# Patient Record
Sex: Female | Born: 1990
Health system: Southern US, Community
[De-identification: ages and names within clinical notes are randomized; demographics above are authoritative.]

## PROBLEM LIST (undated history)

## (undated) DIAGNOSIS — R0600 Dyspnea, unspecified: Secondary | ICD-10-CM

## (undated) DIAGNOSIS — K219 Gastro-esophageal reflux disease without esophagitis: Secondary | ICD-10-CM

## (undated) DIAGNOSIS — E282 Polycystic ovarian syndrome: Secondary | ICD-10-CM

## (undated) DIAGNOSIS — Z87442 Personal history of urinary calculi: Secondary | ICD-10-CM

## (undated) DIAGNOSIS — I10 Essential (primary) hypertension: Secondary | ICD-10-CM

## (undated) DIAGNOSIS — E669 Obesity, unspecified: Secondary | ICD-10-CM

## (undated) DIAGNOSIS — F419 Anxiety disorder, unspecified: Secondary | ICD-10-CM

## (undated) DIAGNOSIS — D649 Anemia, unspecified: Secondary | ICD-10-CM

## (undated) DIAGNOSIS — Z91018 Allergy to other foods: Secondary | ICD-10-CM

## (undated) DIAGNOSIS — L409 Psoriasis, unspecified: Secondary | ICD-10-CM

## (undated) DIAGNOSIS — L732 Hidradenitis suppurativa: Secondary | ICD-10-CM

## (undated) HISTORY — PX: OTHER SURGICAL HISTORY: SHX169

## (undated) HISTORY — PX: TONSILLECTOMY: SUR1361

## (undated) HISTORY — DX: Gastro-esophageal reflux disease without esophagitis: K21.9

## (undated) HISTORY — PX: WISDOM TOOTH EXTRACTION: SHX21

## (undated) HISTORY — PX: TYMPANOSTOMY TUBE PLACEMENT: SHX32

## (undated) HISTORY — PX: ANKLE RECONSTRUCTION: SHX1151

---
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
## 2013-11-15 ENCOUNTER — Other Ambulatory Visit: Payer: Self-pay | Admitting: Family Medicine

## 2013-11-15 DIAGNOSIS — Z20828 Contact with and (suspected) exposure to other viral communicable diseases: Secondary | ICD-10-CM

## 2013-11-15 MED ORDER — OSELTAMIVIR PHOSPHATE 75 MG PO CAPS: 75.0000 mg | ORAL_CAPSULE | Freq: Two times a day (BID) | ORAL | Status: AC

## 2015-09-26 LAB — CBC AND DIFFERENTIAL
HCT: 44 % (ref 36–46)
Hemoglobin: 14.5 g/dL (ref 12.0–16.0)
Neutrophils Absolute: 6 /uL
Platelets: 327 10*3/uL (ref 150–399)
WBC: 8.4 10^3/mL

## 2015-09-26 LAB — LIPID PANEL
Cholesterol: 148 mg/dL (ref 0–200)
HDL: 40 mg/dL (ref 35–70)
LDL Cholesterol: 92 mg/dL
LDl/HDL Ratio: 3.7
Triglycerides: 79 mg/dL (ref 40–160)

## 2015-09-26 LAB — HEPATIC FUNCTION PANEL
ALT: 25 U/L (ref 3–30)
AST: 20 U/L (ref 2–40)
Alkaline Phosphatase: 59 U/L (ref 25–125)
Bilirubin, Total: 0.5 mg/dL

## 2015-09-26 LAB — BASIC METABOLIC PANEL
BUN: 11 mg/dL (ref 4–21)
Creatinine: 0.6 mg/dL (ref 0.5–1.1)
Glucose: 87 mg/dL
Potassium: 3.7 mmol/L (ref 3.4–5.3)
Sodium: 138 mmol/L (ref 137–147)

## 2015-09-26 LAB — HEMOGLOBIN A1C: Hemoglobin A1C: 5.3

## 2015-09-26 LAB — VITAMIN D 25 HYDROXY (VIT D DEFICIENCY, FRACTURES): Vit D, 25-Hydroxy: 25.4

## 2015-09-26 LAB — TSH: TSH: 1.09 u[IU]/mL (ref 0.41–5.90)

## 2015-12-27 ENCOUNTER — Telehealth: Payer: 59 | Admitting: Nurse Practitioner

## 2015-12-27 DIAGNOSIS — J0100 Acute maxillary sinusitis, unspecified: Secondary | ICD-10-CM | POA: Diagnosis not present

## 2015-12-27 MED ORDER — AMOXICILLIN-POT CLAVULANATE 875-125 MG PO TABS: 1.0000 | ORAL_TABLET | Freq: Two times a day (BID) | ORAL | Status: DC

## 2015-12-27 NOTE — Progress Notes (Signed)

## 2016-01-04 ENCOUNTER — Telehealth: Payer: Self-pay | Admitting: *Deleted

## 2016-01-04 NOTE — Telephone Encounter (Signed)
PLEASE CALL IN TRIANEX FOR PTS ECZEMA TO [facility] IN [place].

## 2016-01-08 ENCOUNTER — Other Ambulatory Visit: Payer: Self-pay

## 2016-01-08 MED ORDER — TRIAMCINOLONE ACETONIDE 0.05 % EX OINT: TOPICAL_OINTMENT | CUTANEOUS | Status: DC

## 2016-01-08 NOTE — Telephone Encounter (Signed)
Trianex 430g jar Rx sent to [facility]. Patient informed.

## 2016-01-08 NOTE — Telephone Encounter (Signed)
Please complete.

## 2016-04-03 DIAGNOSIS — R87615 Unsatisfactory cytologic smear of cervix: Secondary | ICD-10-CM | POA: Diagnosis not present

## 2016-04-03 LAB — HM PAP SMEAR: HM Pap smear: NORMAL

## 2016-05-09 ENCOUNTER — Telehealth: Payer: 59 | Admitting: Physician Assistant

## 2016-05-09 DIAGNOSIS — R0789 Other chest pain: Secondary | ICD-10-CM

## 2016-05-09 DIAGNOSIS — J069 Acute upper respiratory infection, unspecified: Secondary | ICD-10-CM

## 2016-05-09 NOTE — Progress Notes (Signed)
Based on what you shared with me it looks like you have a serious condition that should be evaluated in a face to face office visit. Your overall symptoms seem consistent with a viral sinusitis, but the mention of chest pressure and difficulty breathing is concerning. As such, I recommend you see a provider -- either your primary care provider or at an urgent care -- within 24 hours for a good lung examination to make sure there is no concern for a walking pneumonia or reactive airway disease. Stay well hydrated and use some plain Mucinex to thin out congestion. Saline nasal spray to help flush out nasal passages and cut down on post-nasal drip. Again please be seen within 24 hours for a good exam so we can get you feeling better.  If you are having a true medical emergency please call 911.  If you need an urgent face to face visit, [place] has four urgent care centers for your convenience.  If you need care fast and have a high deductible or no insurance consider:   [email or web address]  [phone]  [address], [place], [place] [address] 8 am to 8 pm Monday-Friday 10 am to 4 pm Saturday-Sunday   The following sites will take your  insurance:    . [facility] [place] a Provider at this Location  [address] [place], [place] [address] . 8 am to 8 pm Monday-Friday . 9 am to 7 pm Saturday-Sunday   . [facility] [place] a Provider at this Location  [address] [place], [place], [place] [address] . 8 am to 8 pm Monday-Friday . 9 am to 6 pm Saturday . 11 am to 6 pm Sunday   . [facility] [place] Get Driving Directions  [id number] [place], [place] [address] . 8 am to 8 pm Monday-Friday . 9 am to 4 pm Saturday-Sunday   . [facility] (a walk in  primary care provider)  [place] a Provider at this Location  [place], [place] [address] . 8 am to 8:30 pm Monday-Thursday . 8 am to 6 pm Friday . 8 am to 4 pm Saturday-Sunday   Your e-visit answers were reviewed by a board certified advanced clinical practitioner to complete your personal care plan.  Thank you for using e-Visits.

## 2016-05-10 ENCOUNTER — Telehealth: Payer: Self-pay | Admitting: *Deleted

## 2016-05-10 MED ORDER — OSELTAMIVIR PHOSPHATE 75 MG PO CAPS: 75.0000 mg | ORAL_CAPSULE | Freq: Two times a day (BID) | ORAL | Status: DC

## 2016-05-10 NOTE — Telephone Encounter (Signed)
Please inform Brighten that this actually could be influenza and we will treat her for that condition by using Tamiflu 75 mg twice a day. She can also use prednisone 10 mg once a day for the next 5 days in addition to all her other medications including ibuprofen and Mucinex and antihistamine as needed. If for some reason she gets worse she will need to contact us for further evaluation and treatment

## 2016-05-10 NOTE — Telephone Encounter (Signed)
PATIENT ADVISED OF INSTRUCTIONS, RX SENT

## 2016-05-10 NOTE — Telephone Encounter (Signed)
C/o chest congestion got worse since yesterday cough is worse headache, chest gets tight at times. Delsym is giving her an upset stomach. Can we prescribe something?

## 2016-05-15 DIAGNOSIS — H5213 Myopia, bilateral: Secondary | ICD-10-CM | POA: Diagnosis not present

## 2016-05-15 DIAGNOSIS — H52222 Regular astigmatism, left eye: Secondary | ICD-10-CM | POA: Diagnosis not present

## 2016-05-24 ENCOUNTER — Telehealth: Payer: 59 | Admitting: Family

## 2016-05-24 DIAGNOSIS — J329 Chronic sinusitis, unspecified: Secondary | ICD-10-CM

## 2016-05-24 DIAGNOSIS — B9689 Other specified bacterial agents as the cause of diseases classified elsewhere: Secondary | ICD-10-CM

## 2016-05-24 DIAGNOSIS — A499 Bacterial infection, unspecified: Secondary | ICD-10-CM

## 2016-05-24 MED ORDER — AMOXICILLIN-POT CLAVULANATE 875-125 MG PO TABS: 1.0000 | ORAL_TABLET | Freq: Two times a day (BID) | ORAL | Status: DC

## 2016-05-24 NOTE — Progress Notes (Signed)

## 2016-11-02 ENCOUNTER — Telehealth: Payer: 59 | Admitting: Family

## 2016-11-02 DIAGNOSIS — B9689 Other specified bacterial agents as the cause of diseases classified elsewhere: Secondary | ICD-10-CM | POA: Diagnosis not present

## 2016-11-02 DIAGNOSIS — J329 Chronic sinusitis, unspecified: Secondary | ICD-10-CM | POA: Diagnosis not present

## 2016-11-02 MED ORDER — AMOXICILLIN-POT CLAVULANATE 875-125 MG PO TABS: 1.0000 | ORAL_TABLET | Freq: Two times a day (BID) | ORAL | 0 refills | Status: AC

## 2016-11-02 NOTE — Progress Notes (Signed)

## 2016-12-15 ENCOUNTER — Emergency Department (HOSPITAL_BASED_OUTPATIENT_CLINIC_OR_DEPARTMENT_OTHER): Payer: 59

## 2016-12-15 ENCOUNTER — Emergency Department (HOSPITAL_BASED_OUTPATIENT_CLINIC_OR_DEPARTMENT_OTHER)
Admission: EM | Admit: 2016-12-15 | Discharge: 2016-12-15 | Disposition: A | Discharge status: Home / Self Care | Payer: 59 | Attending: Emergency Medicine | Admitting: Emergency Medicine

## 2016-12-15 ENCOUNTER — Encounter (HOSPITAL_BASED_OUTPATIENT_CLINIC_OR_DEPARTMENT_OTHER): Payer: Self-pay | Admitting: Emergency Medicine

## 2016-12-15 DIAGNOSIS — Y9222 Religious institution as the place of occurrence of the external cause: Secondary | ICD-10-CM | POA: Insufficient documentation

## 2016-12-15 DIAGNOSIS — S99921A Unspecified injury of right foot, initial encounter: Secondary | ICD-10-CM | POA: Diagnosis not present

## 2016-12-15 DIAGNOSIS — Y9389 Activity, other specified: Secondary | ICD-10-CM | POA: Insufficient documentation

## 2016-12-15 DIAGNOSIS — M79604 Pain in right leg: Secondary | ICD-10-CM | POA: Insufficient documentation

## 2016-12-15 DIAGNOSIS — M7989 Other specified soft tissue disorders: Secondary | ICD-10-CM | POA: Diagnosis not present

## 2016-12-15 DIAGNOSIS — Z791 Long term (current) use of non-steroidal anti-inflammatories (NSAID): Secondary | ICD-10-CM | POA: Diagnosis not present

## 2016-12-15 DIAGNOSIS — Y999 Unspecified external cause status: Secondary | ICD-10-CM | POA: Diagnosis not present

## 2016-12-15 DIAGNOSIS — Z79899 Other long term (current) drug therapy: Secondary | ICD-10-CM | POA: Insufficient documentation

## 2016-12-15 DIAGNOSIS — I10 Essential (primary) hypertension: Secondary | ICD-10-CM | POA: Insufficient documentation

## 2016-12-15 DIAGNOSIS — W1839XA Other fall on same level, initial encounter: Secondary | ICD-10-CM | POA: Insufficient documentation

## 2016-12-15 DIAGNOSIS — S8991XA Unspecified injury of right lower leg, initial encounter: Secondary | ICD-10-CM | POA: Diagnosis present

## 2016-12-15 DIAGNOSIS — M79671 Pain in right foot: Secondary | ICD-10-CM | POA: Diagnosis not present

## 2016-12-15 DIAGNOSIS — W19XXXA Unspecified fall, initial encounter: Secondary | ICD-10-CM

## 2016-12-15 DIAGNOSIS — M25561 Pain in right knee: Secondary | ICD-10-CM | POA: Diagnosis not present

## 2016-12-15 HISTORY — DX: Polycystic ovarian syndrome: E28.2

## 2016-12-15 HISTORY — DX: Obesity, unspecified: E66.9

## 2016-12-15 HISTORY — DX: Essential (primary) hypertension: I10

## 2016-12-15 NOTE — ED Notes (Signed)
Patient transported to X-ray 

## 2016-12-15 NOTE — ED Notes (Signed)
Pt states she fell [place] the steps at church. C/O pain and swelling to RLE. Ice being applied. Also c/o pain to right wrist and 4th and 5th toes of right foot. Abrasions noted.

## 2016-12-15 NOTE — ED Provider Notes (Signed)
[facility] Provider Note   CSN: [id number] Arrival date & time: 12/15/16  1324  By signing my name below, I, [name] Fuentes, attest that this documentation has been prepared under the direction and in the presence of non-physician practitioner, Melina Schools, PA-C. Electronically Signed: Jeanell Fuentes, Scribe. 12/15/2016. 4:06 PM.  History   Chief Complaint Chief Complaint  Patient presents with  . Fall   The history is provided by the patient. No language interpreter was used.   HPI Comments: [name] Fuentes is a 26 y.o. female who presents to the Emergency Department complaining of a fall that occurred about 6 hours ago. She states she fell going up stairs at church and landed on her her LLE and right wrist. She denies any LOC or head injury. She reports constant moderate right shin pain that is exacerbated by movement. She had associated swelling and redness that was relieved by ice. She further reports mild wounds to LLE.Tetanus is UTD. She denies any gait problem or other complaints.     PCP: Ival Bible, MD  Past Medical History:  Diagnosis Date  . Hypertension   . Obese   . PCOS (polycystic ovarian syndrome)     There are no active problems to display for this patient.   Past Surgical History:  Procedure Laterality Date  . ANKLE RECONSTRUCTION     L ankle  . Tempano Plasty    . TONSILLECTOMY      OB History    No data available       Home Medications    Prior to Admission medications   Medication Sig Start Date End Date Taking? Authorizing Provider  ranitidine (ZANTAC) 150 MG tablet Take 150 mg by mouth 2 (two) times daily.   Yes Historical Provider, MD  amoxicillin-clavulanate (AUGMENTIN) 875-125 MG tablet Take 1 tablet by mouth 2 (two) times daily. 05/24/16   Kennyth Arnold, FNP  ibuprofen (ADVIL,MOTRIN) 200 MG tablet Take 400 mg by mouth every 6 (six) hours as needed. For pain     Historical Provider, MD  oseltamivir (TAMIFLU) 75 MG  capsule Take 1 capsule (75 mg total) by mouth 2 (two) times daily. 05/10/16   Jiles Prows, MD  TRIAMCINOLONE ACETONIDE, TOP, (TRIANEX) 0.05 % OINT Apply to affected areas once daily as directed. 01/08/16   Jiles Prows, MD    Family History No family history on file.  Social History Social History  Substance Use Topics  . Smoking status: Never Smoker  . Smokeless tobacco: Never Used  . Alcohol use No     Allergies   Patient has no known allergies.   Review of Systems Review of Systems  Musculoskeletal: Positive for arthralgias, joint swelling and myalgias (Left shin). Negative for gait problem.  Skin: Positive for color change and wound (LLE, mild).  Neurological: Negative for syncope.  All other systems reviewed and are negative.    Physical Exam Updated Vital Signs BP 163/78 (BP Location: Right Arm)   Pulse 94   Temp 98.1 F (36.7 C) (Oral)   Resp 18   Ht 5\' 2"  (1.575 m)   Wt (!) 330 lb (149.7 kg)   SpO2 100%   BMI 60.36 kg/m   Physical Exam  Constitutional: She appears well-developed and well-nourished. No distress.  HENT:  Head: Normocephalic.  Eyes: Conjunctivae are normal.  Neck: Neck supple.  Cardiovascular: Normal rate and regular rhythm.   Pulmonary/Chest: Effort normal.  Abdominal: Soft.  Musculoskeletal: Normal range of motion.  Significant edema over the right anterior shin. Superficial abrasions noted. Bleeding is controlled. Able to ambulate with normal gait. DP pulses are +2 bilaterally. Cap refill is normal. Sensation intact. Full ROM of the right knee and right ankle without pain. No deformities or crepitus noted. Abrasions to the top of the right foot. Bleed is controlled.   Neurological: She is alert.  Skin: Skin is warm and dry.  Psychiatric: She has a normal mood and affect.  Nursing note and vitals reviewed.    ED Treatments / Results  DIAGNOSTIC STUDIES: Oxygen Saturation is 100% on RA, normal by my interpretation.    COORDINATION  OF CARE: 4:10 PM- Pt advised of plan for treatment and pt agrees.  Labs (all labs ordered are listed, but only abnormal results are displayed) Labs Reviewed - No data to display  EKG  EKG Interpretation None       Radiology Dg Tibia/fibula Right  Result Date: 12/15/2016 CLINICAL DATA:  [place].  Pain. EXAM: RIGHT TIBIA AND FIBULA - 2 VIEW COMPARISON:  None. FINDINGS: There is no evidence of fracture or other focal bone lesions. Marked anterior soft tissue swelling. Increased body habitus. IMPRESSION: Marked anterior soft tissue swelling.  No fracture is seen. Electronically Signed   By: Staci Righter M.D.   On: 12/15/2016 14:09   Dg Knee Complete 4 Views Right  Result Date: 12/15/2016 CLINICAL DATA:  [place] going up stairs at church today. Proximal tib-fib pain. EXAM: RIGHT KNEE - COMPLETE 4+ VIEW COMPARISON:  None. FINDINGS: No evidence of fracture, dislocation, or joint effusion. No evidence of arthropathy or other focal bone abnormality. Soft tissues are unremarkable except for possible anterior soft tissue swelling in the proximal lower leg. IMPRESSION: Negative. Electronically Signed   By: Nelson Chimes M.D.   On: 12/15/2016 16:47   Dg Foot Complete Right  Result Date: 12/15/2016 CLINICAL DATA:  [place] today going up stairs at church. Metatarsal pain. EXAM: RIGHT FOOT COMPLETE - 3+ VIEW COMPARISON:  None. FINDINGS: There is no evidence of fracture or dislocation. There is no evidence of arthropathy or other focal bone abnormality. Soft tissues are unremarkable. IMPRESSION: Negative. Electronically Signed   By: Nelson Chimes M.D.   On: 12/15/2016 16:47    Procedures Procedures (including critical care time)  Medications Ordered in ED Medications - No data to display   Initial Impression / Assessment and Plan / ED Course  I have reviewed the triage vital signs and the nursing notes.  Pertinent labs & imaging results that were available during my care of the patient were reviewed by me  and considered in my medical decision making (see chart for details).     Patient X-Ray negative for obvious fracture or dislocation. Pain managed in ED. Neurovascularly intact. Pt advised to follow up with orthopedics if symptoms persist for possibility of missed fracture diagnosis. Patient given brace while in ED, conservative therapy recommended and discussed. Patient will be dc home & is agreeable with above plan.   Final Clinical Impressions(s) / ED Diagnoses   Final diagnoses:  Fall, initial encounter  Right leg pain    New Prescriptions Discharge Medication List as of 12/15/2016  4:57 PM     I personally performed the services described in this documentation, which was scribed in my presence. The recorded information has been reviewed and is accurate.     Doristine Devoid, PA-C 12/16/16 [place], MD 12/16/16 [phone]

## 2016-12-15 NOTE — Discharge Instructions (Signed)
All of your x-rays show no signs of fracture. This is likely a sprain. Please rest, ice, elevate your right leg. Use crutches at home as needed. Weight bearing as tolerated. Motrin Tylenol for pain and swelling. Follow-up with her primary care doctor if her symptoms do not improve in the next 3-4 days for repeat x-rays for possibility of missed fracture.

## 2016-12-15 NOTE — ED Triage Notes (Signed)
States fell at church due to shoe getting caught on step. Pain to RLL, abrasion and swelling noted to shin area. No LOC

## 2016-12-15 NOTE — ED Notes (Signed)
Pt and family given d/c instructions as per chart. Verbalizes understanding. No questions. 

## 2017-03-12 ENCOUNTER — Encounter: Payer: Self-pay | Admitting: Physician Assistant

## 2017-03-12 ENCOUNTER — Ambulatory Visit (INDEPENDENT_AMBULATORY_CARE_PROVIDER_SITE_OTHER): Payer: 59 | Admitting: Physician Assistant

## 2017-03-12 VITALS — BP 133/89 | HR 79 | Ht 62.0 in | Wt 340.0 lb

## 2017-03-12 DIAGNOSIS — I10 Essential (primary) hypertension: Secondary | ICD-10-CM | POA: Diagnosis not present

## 2017-03-12 DIAGNOSIS — E282 Polycystic ovarian syndrome: Secondary | ICD-10-CM

## 2017-03-12 DIAGNOSIS — L409 Psoriasis, unspecified: Secondary | ICD-10-CM | POA: Diagnosis not present

## 2017-03-12 DIAGNOSIS — K219 Gastro-esophageal reflux disease without esophagitis: Secondary | ICD-10-CM | POA: Diagnosis not present

## 2017-03-12 DIAGNOSIS — Z23 Encounter for immunization: Secondary | ICD-10-CM

## 2017-03-12 DIAGNOSIS — R030 Elevated blood-pressure reading, without diagnosis of hypertension: Secondary | ICD-10-CM | POA: Diagnosis not present

## 2017-03-12 MED ORDER — METFORMIN HCL ER 500 MG PO TB24: 500.0000 mg | ORAL_TABLET | Freq: Every day | ORAL | 11 refills | Status: DC

## 2017-03-12 MED ORDER — CLOBETASOL PROPIONATE 0.05 % EX CREA: 1.0000 "application " | TOPICAL_CREAM | Freq: Two times a day (BID) | CUTANEOUS | 2 refills | Status: DC

## 2017-03-12 MED ORDER — RANITIDINE HCL 300 MG PO TABS
300.0000 mg | ORAL_TABLET | Freq: Every day | ORAL | 11 refills | Status: DC
Start: 2017-03-12 — End: 2018-03-16

## 2017-03-12 MED FILL — raNITIdine HCL 300 MG TABS: 300 | 30 days supply | Qty: 30 | Fill #0

## 2017-03-12 MED FILL — CLOBETASOL 0.05% CREAM: 0.05 | 30 days supply | Qty: 60 | Fill #0

## 2017-03-12 MED FILL — METFORMIN HCL ER 500 MG TAB: 500 | 30 days supply | Qty: 30 | Fill #0

## 2017-03-12 NOTE — Addendum Note (Signed)
Addended by: Beatris Ship L on: 03/12/2017 11:28 AM   Modules accepted: Orders

## 2017-03-12 NOTE — Progress Notes (Signed)
Subjective:    Patient ID: [name] Fuentes, female    DOB: Mar 20, 1991, 26 y.o.   MRN: [id number]  HPI  Pt is a 26 yo morbid obese female who presents to the clinic to establish care.   She needs paperwork to foster. She is excited about her new weight loss journey. She started truvision 5 days ago and lost 8lbs. She has a lot of energy and feeling really good.  She is exercising regularly. She does have PCOS but not taking anything for this.   She does have some itchy, thick, plaques on bilateral elbows that she would like something for.  .. Active Ambulatory Problems    Diagnosis Date Noted  . Psoriasis 03/12/2017  . PCOS (polycystic ovarian syndrome) 03/12/2017  . Essential hypertension 03/12/2017  . Elevated blood pressure reading 03/12/2017  . Morbid obesity ([place]) 03/12/2017  . Gastroesophageal reflux disease without esophagitis 03/12/2017   Resolved Ambulatory Problems    Diagnosis Date Noted  . No Resolved Ambulatory Problems   Past Medical History:  Diagnosis Date  . GERD (gastroesophageal reflux disease)   . Hypertension   . Obese   . PCOS (polycystic ovarian syndrome)    .Marland Kitchen Family History  Problem Relation Age of Onset  . Hypertension Mother   . Hypertension Father   . Hypertension Brother   . Hypertension Maternal Grandfather   . Cancer Paternal Grandfather     liver  . Stroke Paternal Grandfather    .Marland Kitchen Social History   Social History  . Marital status: Married    Spouse name: N/A  . Number of children: N/A  . Years of education: N/A   Occupational History  . Not on file.   Social History Main Topics  . Smoking status: Never Smoker  . Smokeless tobacco: Never Used  . Alcohol use No  . Drug use: No  . Sexual activity: Yes   Other Topics Concern  . Not on file   Social History Narrative  . No narrative on file      Review of Systems  All other systems reviewed and are negative.      Objective:   Physical Exam  Constitutional:  She is oriented to person, place, and time. She appears well-developed and well-nourished.  Morbid obesity.   HENT:  Head: Normocephalic and atraumatic.  Neck: Normal range of motion. Neck supple. No thyromegaly present.  Cardiovascular: Normal rate, regular rhythm and normal heart sounds.   Pulmonary/Chest: Effort normal and breath sounds normal.  Neurological: She is alert and oriented to person, place, and time.  Skin:  Bilateral thick scaly erythematous plaques on elbows.   Psychiatric: She has a normal mood and affect. Her behavior is normal.          Assessment & Plan:  Marland KitchenMarland KitchenMadison was seen today for establish care.  Diagnoses and all orders for this visit:  Essential hypertension  Elevated blood pressure reading  PCOS (polycystic ovarian syndrome) -     metFORMIN (GLUCOPHAGE XR) 500 MG 24 hr tablet; Take 1 tablet (500 mg total) by mouth daily with breakfast.  Psoriasis -     clobetasol cream (TEMOVATE) 0.05 %; Apply 1 application topically 2 (two) times daily.  Morbid obesity ([facility])  Gastroesophageal reflux disease without esophagitis -     ranitidine (ZANTAC) 300 MG tablet; Take 1 tablet (300 mg total) by mouth at bedtime.   Filled out form for fostering.  Tdap given today.  BP improved on 2nd recheck. Hopefully will  continue to improve with weight loss.  Restarted metformin XR due to hx of GI side effects for PCOS.  Topical steroid given for psorasis. Encouraged sun exposure.  Continue on truvision for weight loss.  Follow up in 3 months for CPE/weight.

## 2017-03-13 ENCOUNTER — Encounter: Payer: Self-pay | Admitting: Physician Assistant

## 2017-04-08 MED FILL — METFORMIN HCL ER 500 MG TAB: 500 | 30 days supply | Qty: 30 | Fill #1

## 2017-04-08 MED FILL — raNITIdine HCL 300 MG TABS: 300 | 30 days supply | Qty: 30 | Fill #1

## 2017-04-09 DIAGNOSIS — Z6841 Body Mass Index (BMI) 40.0 and over, adult: Secondary | ICD-10-CM | POA: Diagnosis not present

## 2017-04-09 DIAGNOSIS — Z01419 Encounter for gynecological examination (general) (routine) without abnormal findings: Secondary | ICD-10-CM | POA: Diagnosis not present

## 2017-04-16 ENCOUNTER — Telehealth: Payer: 59 | Admitting: Family

## 2017-04-16 DIAGNOSIS — J329 Chronic sinusitis, unspecified: Secondary | ICD-10-CM

## 2017-04-16 DIAGNOSIS — B9789 Other viral agents as the cause of diseases classified elsewhere: Secondary | ICD-10-CM

## 2017-04-16 MED ORDER — FLUTICASONE PROPIONATE 50 MCG/ACT NA SUSP: 2.0000 | Freq: Every day | NASAL | 6 refills | Status: DC

## 2017-04-16 MED ORDER — AMOXICILLIN 875 MG PO TABS: 875.0000 mg | ORAL_TABLET | Freq: Two times a day (BID) | ORAL | 0 refills | Status: DC

## 2017-04-16 MED FILL — AMOXICILLIN 875 MG TABLET: 875 | 7 days supply | Qty: 14 | Fill #0

## 2017-04-16 NOTE — Addendum Note (Signed)
Addended by: Evelina Dun A on: 04/16/2017 10:24 AM   Modules accepted: Orders

## 2017-04-16 NOTE — Progress Notes (Signed)
We are sorry that you are not feeling well.  Here is how we plan to help!  Based on what you have shared with me it looks like you have sinusitis.  Sinusitis is inflammation and infection in the sinus cavities of the head.  Based on your presentation I believe you most likely have Acute Viral Sinusitis.This is an infection most likely caused by a virus. There is not specific treatment for viral sinusitis other than to help you with the symptoms until the infection runs its course.  You may use an oral decongestant such as Mucinex D or if you have glaucoma or high blood pressure use plain Mucinex. Saline nasal spray help and can safely be used as often as needed for congestion, I have prescribed: Fluticasone nasal spray two sprays in each nostril twice a day   Providers prescribe antibiotics to treat infections caused by bacteria. Antibiotics are very powerful in treating bacterial infections when they are used properly. To maintain their effectiveness, they should be used only when necessary. Overuse of antibiotics has resulted in the development of superbugs that are resistant to treatment!    After careful review of your answers, I would not recommend an antibiotic for your condition.  Antibiotics are not effective against viruses and therefore should not be used to treat them. Common examples of infections caused by viruses include colds and flu   Some authorities believe that zinc sprays or the use of Echinacea may shorten the course of your symptoms.  Sinus infections are not as easily transmitted as other respiratory infection, however we still recommend that you avoid close contact with loved ones, especially the very young and elderly.  Remember to wash your hands thoroughly throughout the day as this is the number one way to prevent the spread of infection!  Home Care:  Only take medications as instructed by your medical team.  Complete the entire course of an antibiotic.  Do not take  these medications with alcohol.  A steam or ultrasonic humidifier can help congestion.  You can place a towel over your head and breathe in the steam from hot water coming from a faucet.  Avoid close contacts especially the very young and the elderly.  Cover your mouth when you cough or sneeze.  Always remember to wash your hands.  Get Help Right Away If:  You develop worsening fever or sinus pain.  You develop a severe head ache or visual changes.  Your symptoms persist after you have completed your treatment plan.  Make sure you  Understand these instructions.  Will watch your condition.  Will get help right away if you are not doing well or get worse.  Your e-visit answers were reviewed by a board certified advanced clinical practitioner to complete your personal care plan.  Depending on the condition, your plan could have included both over the counter or prescription medications.  If there is a problem please reply  once you have received a response from your provider.  Your safety is important to us.  If you have drug allergies check your prescription carefully.    You can use MyChart to ask questions about today's visit, request a non-urgent call back, or ask for a work or school excuse for 24 hours related to this e-Visit. If it has been greater than 24 hours you will need to follow up with your provider, or enter a new e-Visit to address those concerns.  You will get an e-mail in the next two   days asking about your experience.  I hope that your e-visit has been valuable and will speed your recovery. Thank you for using e-visits.    

## 2017-05-13 MED FILL — METFORMIN HCL ER 500 MG TAB: 500 | 30 days supply | Qty: 30 | Fill #2

## 2017-05-13 MED FILL — raNITIdine HCL 300 MG TABS: 300 | 30 days supply | Qty: 30 | Fill #2

## 2017-06-18 MED FILL — METFORMIN HCL ER 500 MG TAB: 500 | 30 days supply | Qty: 30 | Fill #3

## 2017-06-18 MED FILL — raNITIdine HCL 300 MG TABS: 300 | 30 days supply | Qty: 30 | Fill #3

## 2017-06-24 ENCOUNTER — Ambulatory Visit: Payer: 59 | Admitting: Physician Assistant

## 2017-06-27 DIAGNOSIS — D485 Neoplasm of uncertain behavior of skin: Secondary | ICD-10-CM | POA: Diagnosis not present

## 2017-06-27 DIAGNOSIS — D229 Melanocytic nevi, unspecified: Secondary | ICD-10-CM | POA: Diagnosis not present

## 2017-06-27 DIAGNOSIS — L732 Hidradenitis suppurativa: Secondary | ICD-10-CM | POA: Diagnosis not present

## 2017-07-15 MED FILL — METFORMIN HCL ER 500 MG TAB: 500 | 30 days supply | Qty: 30 | Fill #4

## 2017-07-15 MED FILL — raNITIdine HCL 300 MG TABS: 300 | 30 days supply | Qty: 30 | Fill #4

## 2017-08-17 ENCOUNTER — Telehealth: Payer: 59 | Admitting: Family

## 2017-08-17 DIAGNOSIS — J019 Acute sinusitis, unspecified: Secondary | ICD-10-CM

## 2017-08-17 MED ORDER — DOXYCYCLINE HYCLATE 100 MG PO TABS: 100.0000 mg | ORAL_TABLET | Freq: Two times a day (BID) | ORAL | 0 refills | Status: DC

## 2017-08-17 NOTE — Progress Notes (Signed)

## 2017-08-26 MED FILL — raNITIdine HCL 300 MG TABS: 300 | 30 days supply | Qty: 30 | Fill #5

## 2017-09-15 MED FILL — DOXYCYCLINE HYCLATE 100 MG: 100 | 10 days supply | Qty: 20 | Fill #0

## 2017-09-18 DIAGNOSIS — Z3202 Encounter for pregnancy test, result negative: Secondary | ICD-10-CM | POA: Diagnosis not present

## 2017-09-18 DIAGNOSIS — Z30433 Encounter for removal and reinsertion of intrauterine contraceptive device: Secondary | ICD-10-CM | POA: Diagnosis not present

## 2017-09-29 MED FILL — SPIRONOLACTONE 50 MG TAB: 50 | 30 days supply | Qty: 30 | Fill #0

## 2017-09-29 MED FILL — raNITIdine HCL 300 MG TABS: 300 | 30 days supply | Qty: 30 | Fill #6

## 2017-09-29 MED FILL — METFORMIN HCL ER 500 MG TAB: 500 | 30 days supply | Qty: 60 | Fill #0

## 2017-10-16 DIAGNOSIS — H52222 Regular astigmatism, left eye: Secondary | ICD-10-CM | POA: Diagnosis not present

## 2017-10-16 DIAGNOSIS — H5213 Myopia, bilateral: Secondary | ICD-10-CM | POA: Diagnosis not present

## 2017-11-05 MED FILL — raNITIdine HCL 300 MG TABS: 300 | 30 days supply | Qty: 30 | Fill #7

## 2017-11-05 MED FILL — SPIRONOLACTONE 50 MG TABLET: 50 | 30 days supply | Qty: 30 | Fill #1

## 2017-12-02 MED FILL — raNITIdine HCL 300 MG TABS: 300 | 30 days supply | Qty: 30 | Fill #8

## 2017-12-02 MED FILL — SPIRONOLACTONE 50 MG TABLET: 50 | 30 days supply | Qty: 30 | Fill #2

## 2018-01-06 MED FILL — raNITIdine HCL 300 MG TABS: 300 | 30 days supply | Qty: 30 | Fill #9

## 2018-01-15 ENCOUNTER — Telehealth: Payer: 59 | Admitting: Physician Assistant

## 2018-01-15 DIAGNOSIS — H00013 Hordeolum externum right eye, unspecified eyelid: Secondary | ICD-10-CM | POA: Diagnosis not present

## 2018-01-15 NOTE — Progress Notes (Signed)
We are sorry that you are not feeling well. Here is how we plan to help!  Based on what you have shared with me it looks like you have a stye.  A stye is an inflammation of the eyelid.  It is often a red, painful lump near the edge of the eyelid that may look like a boil or a pimple.  A stye develops when an infection occurs at the base of an eyelash.   We have made appropriate suggestions for you based upon your presentation: Simple styes can be treated without medical intervention.  Most styes either resolve spontaneously or resolve with simple home treatment by applying warm compresses or heated washcloth to the stye for about 10-15 minutes three to four times a day. This causes the stye to drain and resolve.   I cannot say that this are related without examining you. I would recommend you follow the instructions above and below, and follow-up with your primary care or an Urgent Care provider to take a look at these areas to see if they are related and to give the most appropriate ongoing treatment.   HOME CARE:   Wash your hands often!  Let the stye open on its own. Don't squeeze or open it.  Don't rub your eyes. This can irritate your eyes and let in bacteria.  If you need to touch your eyes, wash your hands first.  Don't wear eye makeup or contact lenses until the area has healed.  GET HELP RIGHT AWAY IF:   Your symptoms do not improve.  You develop blurred or loss of vision.  Your symptoms worsen (increased discharge, pain or redness).  Thank you for choosing an e-visit.  Your e-visit answers were reviewed by a board certified advanced clinical practitioner to complete your personal care plan.  Depending upon the condition, your plan could have included both over the counter or prescription medications.  Please review your pharmacy choice.  Make sure the pharmacy is open so you can pick up prescription now.  If there is a problem, you may contact your provider through [employer] and have the prescription routed to another pharmacy.    Your safety is important to [place].  If you have drug allergies check your prescription carefully.  For the next 24 hours you can use MyChart to ask questions about today's visit, request a non-urgent call back, or ask for a work or school excuse.  You will get an email in the next two days asking about your experience.  I hope you that your e-visit has been valuable and will speed your recovery.

## 2018-01-30 MED FILL — raNITIdine HCL 300 MG TABS: 300 | 30 days supply | Qty: 30 | Fill #10

## 2018-01-30 MED FILL — METFORMIN HCL ER 500 MG TAB: 500 | 30 days supply | Qty: 60 | Fill #1

## 2018-01-30 MED FILL — SPIRONOLACTONE 50 MG TABLET: 50 | 30 days supply | Qty: 30 | Fill #0

## 2018-03-16 ENCOUNTER — Other Ambulatory Visit: Payer: Self-pay | Admitting: Physician Assistant

## 2018-03-16 DIAGNOSIS — K219 Gastro-esophageal reflux disease without esophagitis: Secondary | ICD-10-CM

## 2018-03-16 MED FILL — raNITIdine HCL 300 MG TABS: 300 | 30 days supply | Qty: 30 | Fill #0

## 2018-03-16 MED FILL — METFORMIN HCL ER 500 MG TAB: 500 | 30 days supply | Qty: 60 | Fill #2

## 2018-03-16 MED FILL — SPIRONOLACTONE 50 MG TAB: 50 | 30 days supply | Qty: 30 | Fill #1

## 2018-03-20 ENCOUNTER — Telehealth: Payer: 59 | Admitting: Nurse Practitioner

## 2018-03-20 DIAGNOSIS — J0100 Acute maxillary sinusitis, unspecified: Secondary | ICD-10-CM

## 2018-03-20 DIAGNOSIS — J029 Acute pharyngitis, unspecified: Secondary | ICD-10-CM

## 2018-03-20 MED ORDER — DOXYCYCLINE HYCLATE 100 MG PO TABS: 100.0000 mg | ORAL_TABLET | Freq: Two times a day (BID) | ORAL | 0 refills | Status: DC

## 2018-03-20 NOTE — Progress Notes (Signed)

## 2018-03-22 ENCOUNTER — Telehealth: Payer: 59 | Admitting: Physician Assistant

## 2018-03-22 DIAGNOSIS — B372 Candidiasis of skin and nail: Secondary | ICD-10-CM

## 2018-03-22 MED ORDER — NYSTATIN 100000 UNIT/GM EX CREA
1.0000 | TOPICAL_CREAM | Freq: Two times a day (BID) | CUTANEOUS | 0 refills | Status: DC
Start: 2018-03-22 — End: 2018-09-18

## 2018-03-22 NOTE — Progress Notes (Signed)
E Visit for Rash  We are sorry that you are not feeling well. Here is how we plan to help  Based upon your presentation it appears you have a yeast infection.  I have prescribed: and Nystatin cream apply to the affected area twice daily   HOME CARE:   Take cool showers and avoid direct sunlight.  Apply cool compress or wet dressings.  Take a bath in an oatmeal bath.  Sprinkle content of one Aveeno packet under running faucet with comfortably warm water.  Bathe for 15-20 minutes, 1-2 times daily.  Pat dry with a towel. Do not rub the rash.  Use hydrocortisone cream.  Take an antihistamine like Benadryl for widespread rashes that itch.  The adult dose of Benadryl is 25-50 mg by mouth 4 times daily.  Caution:  This type of medication may cause sleepiness.  Do not drink alcohol, drive, or operate dangerous machinery while taking antihistamines.  Do not take these medications if you have prostate enlargement.  Read package instructions thoroughly on all medications that you take.  GET HELP RIGHT AWAY IF:   Symptoms don't go away after treatment.  Severe itching that persists.  If you rash spreads or swells.  If you rash begins to smell.  If it blisters and opens or develops a yellow-brown crust.  You develop a fever.  You have a sore throat.  You become short of breath.  MAKE SURE YOU:  Understand these instructions. Will watch your condition. Will get help right away if you are not doing well or get worse.  Thank you for choosing an e-visit. Your e-visit answers were reviewed by a board certified advanced clinical practitioner to complete your personal care plan. Depending upon the condition, your plan could have included both over the counter or prescription medications. Please review your pharmacy choice. Be sure that the pharmacy you have chosen is open so that you can pick up your prescription now.  If there is a problem you may message your provider in [place] to  have the prescription routed to another pharmacy. Your safety is important to [place]. If you have drug allergies check your prescription carefully.  For the next 24 hours, you can use MyChart to ask questions about today's visit, request a non-urgent call back, or ask for a work or school excuse from your e-visit provider. You will get an email in the next two days asking about your experience. I hope that your e-visit has been valuable and will speed your recovery.

## 2018-04-14 ENCOUNTER — Telehealth: Payer: 59 | Admitting: Family

## 2018-04-14 DIAGNOSIS — J029 Acute pharyngitis, unspecified: Secondary | ICD-10-CM | POA: Diagnosis not present

## 2018-04-14 MED ORDER — AMOXICILLIN-POT CLAVULANATE 875-125 MG PO TABS: 1.0000 | ORAL_TABLET | Freq: Two times a day (BID) | ORAL | 0 refills | Status: DC

## 2018-04-14 MED ORDER — AMOXICILLIN 875 MG PO TABS: 875.0000 mg | ORAL_TABLET | Freq: Two times a day (BID) | ORAL | 0 refills | Status: DC

## 2018-04-14 NOTE — Progress Notes (Signed)

## 2018-04-15 DIAGNOSIS — H66001 Acute suppurative otitis media without spontaneous rupture of ear drum, right ear: Secondary | ICD-10-CM | POA: Diagnosis not present

## 2018-04-15 DIAGNOSIS — R0982 Postnasal drip: Secondary | ICD-10-CM | POA: Diagnosis not present

## 2018-04-17 ENCOUNTER — Telehealth: Payer: Self-pay | Admitting: Allergy & Immunology

## 2018-04-17 MED ORDER — PREDNISONE 10 MG PO TABS: ORAL_TABLET | ORAL | 0 refills | Status: DC

## 2018-04-17 NOTE — Telephone Encounter (Signed)
I received a call from the patient reporting chest tightness which improves with albuterol. She was recently diagnosed with Strep last week and then bronchitis this week. She is on both azithromycin and amoxicillin. I recommended continuing all of these and increasing the albuterol to four puffs every 4-6 hours.   I will also send in a prednisone burst to her local pharmacy, as she will be returning home tomorrow.   Salvatore Marvel, MD [facility]

## 2018-04-21 MED FILL — raNITIdine HCL 300 MG TABS: 300 | 30 days supply | Qty: 30 | Fill #1

## 2018-04-21 MED FILL — SPIRONOLACTONE 50 MG TAB: 50 | 30 days supply | Qty: 30 | Fill #2

## 2018-04-27 ENCOUNTER — Encounter: Payer: Self-pay | Admitting: Physician Assistant

## 2018-04-27 ENCOUNTER — Ambulatory Visit (INDEPENDENT_AMBULATORY_CARE_PROVIDER_SITE_OTHER): Payer: 59 | Admitting: Physician Assistant

## 2018-04-27 VITALS — BP 115/68 | HR 98 | Temp 98.3°F | Wt 347.0 lb

## 2018-04-27 DIAGNOSIS — B349 Viral infection, unspecified: Secondary | ICD-10-CM

## 2018-04-27 DIAGNOSIS — Z13220 Encounter for screening for lipoid disorders: Secondary | ICD-10-CM

## 2018-04-27 DIAGNOSIS — L732 Hidradenitis suppurativa: Secondary | ICD-10-CM | POA: Diagnosis not present

## 2018-04-27 DIAGNOSIS — K219 Gastro-esophageal reflux disease without esophagitis: Secondary | ICD-10-CM | POA: Diagnosis not present

## 2018-04-27 DIAGNOSIS — R52 Pain, unspecified: Secondary | ICD-10-CM

## 2018-04-27 DIAGNOSIS — R7989 Other specified abnormal findings of blood chemistry: Secondary | ICD-10-CM | POA: Insufficient documentation

## 2018-04-27 DIAGNOSIS — R110 Nausea: Secondary | ICD-10-CM | POA: Diagnosis not present

## 2018-04-27 DIAGNOSIS — R5383 Other fatigue: Secondary | ICD-10-CM

## 2018-04-27 DIAGNOSIS — Z131 Encounter for screening for diabetes mellitus: Secondary | ICD-10-CM | POA: Diagnosis not present

## 2018-04-27 MED ORDER — TRETINOIN 0.1 % EX CREA: TOPICAL_CREAM | Freq: Every day | CUTANEOUS | 1 refills | Status: DC

## 2018-04-27 MED ORDER — ONDANSETRON HCL 8 MG PO TABS: 8.0000 mg | ORAL_TABLET | Freq: Three times a day (TID) | ORAL | 1 refills | Status: DC | PRN

## 2018-04-27 MED FILL — TRETINOIN 0.1 % CREA: 0.1 | 30 days supply | Qty: 45 | Fill #0

## 2018-04-27 MED FILL — ONDANSETRON HCL 8 MG TABLET: 8 | 7 days supply | Qty: 20 | Fill #0

## 2018-04-27 NOTE — Progress Notes (Addendum)
Subjective:    Patient ID: [name] Fuentes, female    DOB: 1991/04/27, 27 y.o.   MRN: [id number]  HPI  Patient is a 27 yo female with a pmxh of morbid obesity, PCOS, HTN, GERD, and psoriasis presenting to the clinic today for fever, nausea, and body aches.  She has not felt well for the past 3 weeks starting with nausea and a sore throat. She was in contact with her sister who had strep pharyngitis.Through an E-visit she was diagnosed with strep and put on augmentin. She did not feel better half way through that week and went to a [facility] where she was diagnosed with an ear infection and URI. She discontinued augmentin and stared azithromycin at that time. Her URI symptoms improved. [place] and developed SHOB. She contacted an allergist and began using an inhaler and prednisone. This helped her symptoms but they were not completely alleviated so she completed a second course of prednisone. After that she felt well for 1 week except for some SHOB only with increased activity. She admits to stomach upset since beginning the initial antibiotic.  Yesterday morning she developed nausea, diarrhea, abdominal pain, fever, body aches, chills, and decreased appetite. She is also fatigued and has SHOB with activity but not rest. She has been taking ibuprofen and tylenol for fever with last dose at 1230. She denies melena, hematochezia, or blood in stools.  She has seasonal allergies for which she uses allegra as need thought she has not used it recently.  Weight - She admits to ongoing struggle with weight loss since childhood. She has tried phentermine in the past which caused elevated HR. She has also tried weight watchers and many diets in the past. She tried HCG injections and lost weight but then gained it all back. She tries to be active. Her mother, brother, sister are all morbidly obese. Her mother had gastric sleeve and is now 163lbs.   GERD -  She uses her zantac daily for GERD and experiences severe symptoms any time she does not take it. She has had positive serum H pylori testing in the past but does not feel she can stop taking her zantac long enough to have stool or breath testing performed.  Lesion - She noticed a boil like lesion on her left lower abdomen 3 weeks ago which she was worried was MRSA. She has been using bacitracin ointment on it.  Hidradenitis suppurativa - Diagnosed by her OB. She is unable to take doxycycline secondary to GI upset. She is one spironolactone.  .. Active Ambulatory Problems    Diagnosis Date Noted  . Psoriasis 03/12/2017  . PCOS (polycystic ovarian syndrome) 03/12/2017  . Essential hypertension 03/12/2017  . Elevated blood pressure reading 03/12/2017  . Morbid obesity ([place]) 03/12/2017  . Gastroesophageal reflux disease without esophagitis 03/12/2017  . Elevated TSH 04/27/2018  . Body aches 04/27/2018   Resolved Ambulatory Problems    Diagnosis Date Noted  . No Resolved Ambulatory Problems   Past Medical History:  Diagnosis Date  . GERD (gastroesophageal reflux disease)   . Hypertension   . Obese   . PCOS (polycystic ovarian syndrome)      Review of Systems See HPI.     Objective:   Physical Exam  Constitutional: She is oriented to person, place, and time. She appears well-developed and well-nourished.  HENT:  Head: Normocephalic and atraumatic.  Right Ear: Tympanic membrane and ear  canal normal.  Left Ear: Tympanic membrane and ear canal normal.  Mouth/Throat: Oropharynx is clear and moist.  Neck: Normal range of motion.  Cardiovascular: Normal rate, regular rhythm and normal heart sounds.  Pulmonary/Chest: Effort normal and breath sounds normal.  Abdominal: Soft. Bowel sounds are normal. There is no tenderness.  Musculoskeletal: Normal range of motion.  Neurological: She is alert and oriented to person, place, and time.  Skin: Skin is warm and dry. Lesion (on the left  lower quadrant. Healing well with eschar. No drainage or erythema) noted.  Psychiatric: She has a normal mood and affect. Her behavior is normal.  Vitals reviewed.  .. Vitals:   04/27/18 1346  BP: 115/68  Pulse: 98  Temp: 98.3 F (36.8 C)  SpO2: 100%       Assessment & Plan:  Marland KitchenMarland KitchenMadison was seen today for headache.  Diagnoses and all orders for this visit:  Viral illness  Screening for lipid disorders -     Lipid Panel w/reflex Direct LDL  Elevated TSH -     Thyroid Panel With TSH  Screening for diabetes mellitus -     COMPLETE METABOLIC PANEL WITH GFR  Body aches -     CBC w/Diff/Platelet -     COMPLETE METABOLIC PANEL WITH GFR  Nausea -     ondansetron (ZOFRAN) 8 MG tablet; Take 1 tablet (8 mg total) by mouth every 8 (eight) hours as needed for nausea or vomiting.  Gastroesophageal reflux disease without esophagitis -     Ambulatory referral to Gastroenterology  No energy  Hidradenitis suppurativa -     tretinoin (RETIN-A) 0.1 % cream; Apply topically at bedtime.  Morbid obesity ([place]) -     Amb Referral to Bariatric Surgery   Weight - As she has struggled with weight loss for several years and failed medical management, referral to GI surgery. She has tried phentermine and weight watchers in the past.  Viral illness - Given the course of her symptoms and recent antibiotic use, bacterial illness unlikely. Discussed her abdominal discomfort and diarrhea may be secondary to the antibiotic use. Courses of prednisone may also be contributing to symptoms and possible infection. Due to the course of her illness, discussed the possibility of mono. Advised her to continue symptomatic care and begin a probiotic. She will return if symptoms progress or worsen.  GERD - Referral for biopsy for suspected H pylori. She has persistent symptoms and is unable to discontinue her zantac for antigen testing.  Lesion - Reassurance given. Does not appear to be infected and healing  well.   HS - Given topical tretinoin to try for prevention.   Screening labs ordered.  Marland KitchenSpent 40 minutes with patient and greater than 50 percent of visit spent counseling patient regarding treatment plan.

## 2018-04-28 ENCOUNTER — Encounter: Payer: Self-pay | Admitting: Physician Assistant

## 2018-04-28 ENCOUNTER — Encounter: Payer: Self-pay | Admitting: Gastroenterology

## 2018-04-28 DIAGNOSIS — R748 Abnormal levels of other serum enzymes: Secondary | ICD-10-CM | POA: Insufficient documentation

## 2018-04-28 DIAGNOSIS — R740 Nonspecific elevation of levels of transaminase and lactic acid dehydrogenase [LDH]: Secondary | ICD-10-CM

## 2018-04-28 DIAGNOSIS — R7401 Elevation of levels of liver transaminase levels: Secondary | ICD-10-CM | POA: Insufficient documentation

## 2018-04-28 LAB — LIPID PANEL W/REFLEX DIRECT LDL
Cholesterol: 146 mg/dL (ref <200)
HDL: 41 mg/dL — ABNORMAL LOW (ref >50)
LDL Cholesterol (Calc): 85 mg/dL (calc)
Non-HDL Cholesterol (Calc): 105 mg/dL (calc) (ref <130)
Total CHOL/HDL Ratio: 3.6 (calc) (ref <5.0)
Triglycerides: 100 mg/dL (ref <150)

## 2018-04-28 LAB — CBC WITH DIFFERENTIAL/PLATELET
Basophils Absolute: 54 cells/uL (ref 0–200)
Basophils Relative: 0.7 %
Eosinophils Absolute: 62 cells/uL (ref 15–500)
Eosinophils Relative: 0.8 %
HCT: 41.1 % (ref 35.0–45.0)
Hemoglobin: 13.9 g/dL (ref 11.7–15.5)
Lymphs Abs: 3072 cells/uL (ref 850–3900)
MCH: 28.9 pg (ref 27.0–33.0)
MCHC: 33.8 g/dL (ref 32.0–36.0)
MCV: 85.4 fL (ref 80.0–100.0)
MPV: 9.8 fL (ref 7.5–12.5)
Monocytes Relative: 5.4 %
Neutro Abs: 4096 cells/uL (ref 1500–7800)
Neutrophils Relative %: 53.2 %
Platelets: 228 10*3/uL (ref 140–400)
RBC: 4.81 10*6/uL (ref 3.80–5.10)
RDW: 12.7 % (ref 11.0–15.0)
Total Lymphocyte: 39.9 %
WBC mixed population: 416 cells/uL (ref 200–950)
WBC: 7.7 10*3/uL (ref 3.8–10.8)

## 2018-04-28 LAB — COMPLETE METABOLIC PANEL WITH GFR
AG Ratio: 1.5 (calc) (ref 1.0–2.5)
ALT: 40 U/L — ABNORMAL HIGH (ref 6–29)
AST: 28 U/L (ref 10–30)
Albumin: 4.1 g/dL (ref 3.6–5.1)
Alkaline phosphatase (APISO): 62 U/L (ref 33–115)
BUN: 10 mg/dL (ref 7–25)
CO2: 26 mmol/L (ref 20–32)
Calcium: 9.5 mg/dL (ref 8.6–10.2)
Chloride: 105 mmol/L (ref 98–110)
Creat: 0.77 mg/dL (ref 0.50–1.10)
GFR, Est African American: 125 mL/min/{1.73_m2} (ref >=60)
GFR, Est Non African American: 108 mL/min/{1.73_m2} (ref >=60)
Globulin: 2.8 g/dL (calc) (ref 1.9–3.7)
Glucose, Bld: 88 mg/dL (ref 65–99)
Potassium: 4.3 mmol/L (ref 3.5–5.3)
Sodium: 140 mmol/L (ref 135–146)
Total Bilirubin: 0.7 mg/dL (ref 0.2–1.2)
Total Protein: 6.9 g/dL (ref 6.1–8.1)

## 2018-04-28 LAB — THYROID PANEL WITH TSH
Free Thyroxine Index: 2.8 (ref 1.4–3.8)
T3 Uptake: 24 % (ref 22–35)
T4, Total: 11.8 ug/dL (ref 5.1–11.9)
TSH: 1.39 mIU/L

## 2018-04-28 NOTE — Progress Notes (Signed)
Call pt: WBC looks great. Not anemic. Kidney function looks great. You have one liver enzyme that is elevated. We need to watch this. Watch any tylenol/alcohol usage for next 2 weeks and recheck. Could be due to fat accumulation around the liver.  Thyroid looks great.  Cholesterol looks great. Would love to see HDL increase.

## 2018-05-19 ENCOUNTER — Encounter: Payer: Self-pay | Admitting: Physician Assistant

## 2018-05-28 ENCOUNTER — Other Ambulatory Visit: Payer: Self-pay | Admitting: Physician Assistant

## 2018-05-28 DIAGNOSIS — K219 Gastro-esophageal reflux disease without esophagitis: Secondary | ICD-10-CM

## 2018-05-28 MED FILL — raNITIdine HCL 300 MG TABS: 300 | 30 days supply | Qty: 30 | Fill #0

## 2018-05-29 MED FILL — SPIRONOLACTONE 50 MG TAB: 50 | 90 days supply | Qty: 90 | Fill #0

## 2018-06-03 ENCOUNTER — Encounter: Payer: 59 | Admitting: Physician Assistant

## 2018-06-03 DIAGNOSIS — Z6841 Body Mass Index (BMI) 40.0 and over, adult: Secondary | ICD-10-CM | POA: Diagnosis not present

## 2018-06-03 DIAGNOSIS — Z01419 Encounter for gynecological examination (general) (routine) without abnormal findings: Secondary | ICD-10-CM | POA: Diagnosis not present

## 2018-06-09 ENCOUNTER — Encounter: Payer: Self-pay | Admitting: Physician Assistant

## 2018-06-26 MED FILL — METFORMIN HCL ER 500 MG TAB: 500 | 30 days supply | Qty: 60 | Fill #3

## 2018-06-26 MED FILL — raNITIdine HCL 300 MG TABS: 300 | 30 days supply | Qty: 30 | Fill #1

## 2018-07-03 ENCOUNTER — Ambulatory Visit: Payer: 59 | Admitting: Gastroenterology

## 2018-07-03 ENCOUNTER — Encounter: Payer: Self-pay | Admitting: Physician Assistant

## 2018-07-20 ENCOUNTER — Other Ambulatory Visit (HOSPITAL_COMMUNITY): Payer: Self-pay | Admitting: General Surgery

## 2018-07-22 ENCOUNTER — Telehealth: Payer: 59 | Admitting: Family

## 2018-07-22 DIAGNOSIS — J019 Acute sinusitis, unspecified: Secondary | ICD-10-CM | POA: Diagnosis not present

## 2018-07-22 MED ORDER — DOXYCYCLINE HYCLATE 100 MG PO TABS: 100.0000 mg | ORAL_TABLET | Freq: Two times a day (BID) | ORAL | 0 refills | Status: DC

## 2018-07-22 NOTE — Progress Notes (Signed)

## 2018-07-29 ENCOUNTER — Other Ambulatory Visit: Payer: Self-pay | Admitting: Physician Assistant

## 2018-07-29 DIAGNOSIS — K219 Gastro-esophageal reflux disease without esophagitis: Secondary | ICD-10-CM

## 2018-08-05 ENCOUNTER — Encounter: Payer: 59 | Attending: General Surgery | Admitting: Skilled Nursing Facility1

## 2018-08-05 ENCOUNTER — Other Ambulatory Visit: Payer: Self-pay

## 2018-08-05 ENCOUNTER — Ambulatory Visit (HOSPITAL_COMMUNITY)
Admission: RE | Admit: 2018-08-05 | Discharge: 2018-08-05 | Disposition: A | Discharge status: Home / Self Care | Payer: 59 | Source: Ambulatory Visit | Attending: General Surgery | Admitting: General Surgery

## 2018-08-05 DIAGNOSIS — Z01818 Encounter for other preprocedural examination: Secondary | ICD-10-CM | POA: Insufficient documentation

## 2018-08-05 DIAGNOSIS — Z6841 Body Mass Index (BMI) 40.0 and over, adult: Secondary | ICD-10-CM | POA: Insufficient documentation

## 2018-08-05 DIAGNOSIS — Z713 Dietary counseling and surveillance: Secondary | ICD-10-CM | POA: Insufficient documentation

## 2018-08-05 NOTE — Progress Notes (Signed)
Pre-Op Assessment Visit:  Pre-Operative Sleeve Surgery  Medical Nutrition Therapy:  Appt start time: 2:05  End time: 3:00  Patient was seen on 08/05/2018 for Pre-Operative Nutrition Assessment. Assessment and letter of approval faxed to [facility] Surgery Bariatric Surgery Program coordinator on 08/05/2018.  Pt expectation of surgery:   Pt expectation of Dietitian: teach tips and tricks for healthy eating/ losing weight  Start weight at [facility]: 348.8lb  BMI: 61.8    24 hr Dietary Recall: First Meal: none   Snack: trail mix or protein bar Second Meal: subway, frozen meal, pre-made salad Snack: trail mix, if anything  Third Meal: hamburger helper, chicken tenders, fries Snack: none Beverages: water with flavor packets, powerade zero, juice, soda (a lot less juice and soda now), some plain water  Encouraged to engage in moderate physical activity including cardiovascular and weight baring weekly  Handouts given during visit include:  . Pre-Op Goals . Bariatric Vitamin & Mineral Supplements  During the appointment today the following Pre-Op Goals were reviewed with the patient: . Maintain or lose weight as instructed by your surgeon . Make healthy food choices . Begin to limit portion sizes . Limited concentrated sugars and fried foods . Keep fat/sugar in the single digits per serving on             food labels . Practice CHEWING your food  (aim for 30 chews per bite or until applesauce consistency) . Practice not drinking 15 minutes before, during, and 30 minutes after each meal/snack . Avoid all carbonated beverages  . Avoid/limit caffeinated beverages  . Avoid all sugar-sweetened beverages . Consume 3 meals per day; eat every 3-5 hours . Make a list of non-food related activities . Aim for 64-100 ounces of FLUID daily  . Aim for at least 60-80 grams of PROTEIN daily . Look for a liquid protein source that contain ?15 g protein and ?5 g carbohydrate  (ex: shakes, drinks,  shots)  -Follow diet recommendations listed below   Energy and Macronutrient Recomendations: Calories: 1600 Carbohydrate: 180g Protein: 120g Fat: 44g  Demonstrated degree of understanding via:  Teach Back  Teaching Method Utilized:  Visual Auditory Hands on  Barriers to learning/adherence to lifestyle change: none identified  Patient to call the [facility] to enroll in Pre-Op and Post-Op Nutrition Education when surgery date is scheduled.

## 2018-08-06 ENCOUNTER — Ambulatory Visit: Payer: Self-pay

## 2018-08-06 DIAGNOSIS — J111 Influenza due to unidentified influenza virus with other respiratory manifestations: Secondary | ICD-10-CM

## 2018-08-10 MED FILL — raNITIdine HCL 300 MG TABS: 300 | 30 days supply | Qty: 30 | Fill #0 | Status: TO

## 2018-08-10 MED FILL — SPIRONOLACTONE 50 MG TAB: 50 | 60 days supply | Qty: 60 | Fill #0

## 2018-08-10 MED FILL — METFORMIN HCL ER 500 MG TAB: 500 | 30 days supply | Qty: 60 | Fill #4

## 2018-08-20 ENCOUNTER — Ambulatory Visit (INDEPENDENT_AMBULATORY_CARE_PROVIDER_SITE_OTHER): Payer: 59 | Admitting: Psychiatry

## 2018-08-20 DIAGNOSIS — F509 Eating disorder, unspecified: Secondary | ICD-10-CM

## 2018-08-23 ENCOUNTER — Telehealth: Payer: 59 | Admitting: Family

## 2018-08-23 DIAGNOSIS — W57XXXA Bitten or stung by nonvenomous insect and other nonvenomous arthropods, initial encounter: Principal | ICD-10-CM

## 2018-08-23 DIAGNOSIS — S80869A Insect bite (nonvenomous), unspecified lower leg, initial encounter: Secondary | ICD-10-CM

## 2018-08-23 NOTE — Progress Notes (Signed)
Thank you for the details you included in the comment boxes. Those details are very helpful in determining the best course of treatment for you and help [place] to provide the best care. Based on the photo, this is highly suspicious for spider bites. There is not much else which would present this way with the spreading diameter, swelling, pain, and heat. The problem is, unless you saw a spider, we cannot verify if it was or what type. However, this looks like most spider bites I have seen. Please continue to put the bacitracin ointment on it and be seen face-to-face ASAP so that a provider can closely evaluate it and decide if they need to drain it and/or debride the tissue which is common in spider bites. At this point in time, the only recommendation I can give is for you to go directly to the Emergency Department to be evaluated. If it is a dangerous spider, it can also lead to fever and breathing problems.   Based on what you shared with me it looks like you have a serious condition that should be evaluated in a face to face office visit.  NOTE: If you entered your credit card information for this eVisit, you will not be charged. You may see a "hold" on your card for the $30 but that hold will drop off and you will not have a charge processed.  If you are having a true medical emergency please call 911.  If you need an urgent face to face visit, [place] has four urgent care centers for your convenience.  If you need care fast and have a high deductible or no insurance consider:   [email or web address] to reserve your spot online an avoid wait times  [facility] [address] [place], [place] [address] 8 am to 8 pm Monday-Friday 10 am to 4 pm Saturday-Sunday *Across the street from [employer]  [place], [address] 8 am to 5 pm Monday-Friday * In the [facility] on the [facility]   The following sites will  take your  insurance:  . [facility] [place] a Provider at this Location  [address] [place], [place] [address] . 10 am to 8 pm Monday-Friday . 12 pm to 8 pm Saturday-Sunday   . [facility] at [place] a Provider at this Location  [place], [place], [place] [address] . 8 am to 8 pm Monday-Friday . 9 am to 6 pm Saturday . 11 am to 6 pm Sunday   . [facility] at [place] Get Driving Directions  [address] [place], [place] [address] . 8 am to 8 pm Monday-Friday . 8 am to 4 pm Saturday-Sunday   Your e-visit answers were reviewed by a board certified advanced clinical practitioner to complete your personal care plan.  Thank you for using e-Visits.

## 2018-08-23 NOTE — Progress Notes (Signed)
ADDENDUM: Made 3 attempts via telephone, connected with patient on 3rd attempt. Had to reinforce the importance of face-to-face. Pt reported she is the only person doing her job and may not be able to call out. Encouraged pt to find a way to see a provider ASAP regardless of her work situation. Pt affirmed understanding. Sent note to pt as below and encouraged her to continue applying bacitracin in the meantime.     Ms. Righter,  As we discussed on our telephone call, see below. Attempted call at 19:39, 19:43, and connected with you at 19:49.  Recommendation is to go to the ED and/or see some type of NP/PA/MD provider ASAP to determine if drainage/debridement is possible. Please let us know if we can be of any further assistance.  Best,  Guadelupe Sabin, DNP, FNP-BC [facility]

## 2018-08-25 DIAGNOSIS — W57XXXA Bitten or stung by nonvenomous insect and other nonvenomous arthropods, initial encounter: Secondary | ICD-10-CM | POA: Diagnosis not present

## 2018-08-25 DIAGNOSIS — H66001 Acute suppurative otitis media without spontaneous rupture of ear drum, right ear: Secondary | ICD-10-CM | POA: Diagnosis not present

## 2018-08-25 DIAGNOSIS — S80862A Insect bite (nonvenomous), left lower leg, initial encounter: Secondary | ICD-10-CM | POA: Diagnosis not present

## 2018-09-10 ENCOUNTER — Ambulatory Visit: Payer: Self-pay | Admitting: General Surgery

## 2018-09-10 ENCOUNTER — Ambulatory Visit (INDEPENDENT_AMBULATORY_CARE_PROVIDER_SITE_OTHER): Payer: 59 | Admitting: Psychiatry

## 2018-09-10 ENCOUNTER — Ambulatory Visit: Payer: Self-pay | Admitting: Dietician

## 2018-09-10 DIAGNOSIS — F509 Eating disorder, unspecified: Secondary | ICD-10-CM

## 2018-09-10 MED FILL — raNITIdine HCL 300 MG TABS: 300 | 30 days supply | Qty: 30 | Fill #0

## 2018-09-10 MED FILL — oxyCODONE HCL 5 MG/5ML SOLN: 5 | 2 days supply | Qty: 100 | Fill #0

## 2018-09-14 ENCOUNTER — Encounter: Payer: 59 | Attending: General Surgery | Admitting: Skilled Nursing Facility1

## 2018-09-14 ENCOUNTER — Encounter: Payer: Self-pay | Admitting: Skilled Nursing Facility1

## 2018-09-14 DIAGNOSIS — Z713 Dietary counseling and surveillance: Secondary | ICD-10-CM | POA: Insufficient documentation

## 2018-09-14 DIAGNOSIS — Z6841 Body Mass Index (BMI) 40.0 and over, adult: Secondary | ICD-10-CM | POA: Insufficient documentation

## 2018-09-14 DIAGNOSIS — E669 Obesity, unspecified: Secondary | ICD-10-CM

## 2018-09-14 NOTE — Progress Notes (Signed)
Pre-Operative Nutrition Class:  Appt start time: 6948   End time:  1830.  Patient was seen on 09/14/2018 for Pre-Operative Bariatric Surgery Education at [facility].   Surgery date:  Surgery type: sleeve Start weight at [facility]: 348.8 Weight today: 354.2  Samples given per MNT protocol. Patient educated on appropriate usage: Bariatric Advantage Multivitamin Lot # [id number] Exp:10/20  Bariatric Advantage Calcium  Lot # [id number] Exp: jul-25-2020  Renee Pain Protein Powder  Shake Lot # [id number]         March-05-2019 Exp: 01/2020        [id number]  The following the learning objectives were met by the patient during this course:  Identify Pre-Op Dietary Goals and will begin 2 weeks pre-operatively  Identify appropriate sources of fluids and proteins   State protein recommendations and appropriate sources pre and post-operatively  Identify Post-Operative Dietary Goals and will follow for 2 weeks post-operatively  Identify appropriate multivitamin and calcium sources  Describe the need for physical activity post-operatively and will follow MD recommendations  State when to call healthcare provider regarding medication questions or post-operative complications  Handouts given during class include:  Pre-Op Bariatric Surgery Diet Handout  Protein Shake Handout  Post-Op Bariatric Surgery Nutrition Handout  BELT Program Information Flyer  Support Group Information Flyer  [facility] Bariatric Supplements Price List  Follow-Up Plan: [facility] for diet advancement per MD.

## 2018-09-15 ENCOUNTER — Ambulatory Visit: Payer: 59 | Admitting: Psychiatry

## 2018-09-24 NOTE — Progress Notes (Signed)
EKG, CXR 08-05-18 epic

## 2018-09-24 NOTE — Patient Instructions (Addendum)
[name] Fuentes  09/24/2018   Your procedure is scheduled on: 09-28-18   Report to [facility] Main  Entrance    Report to admitting at 7:30AM    Call this number if you have problems the morning of surgery [phone]     Remember: NO SOLID FOOD AFTER 600 PM THE NIGHT BEFORE YOUR SURGERY. YOU MAY DRINK CLEAR FLUIDS. MORNING OF SURGERY DRINK:  1SHAKE BEFORE YOU LEAVE HOME, DRINK ALL OF THE SHAKE AT ONE TIME. THE SHAKE YOU DRINK BEFORE YOU LEAVE HOME WILL BE THE LAST FLUIDS YOU DRINK BEFORE SURGERY.      CLEAR LIQUID DIET   Foods Allowed                                                                     Foods Excluded  Coffee and tea, regular and decaf                             liquids that you cannot  Plain Jell-O in any flavor                                             see through such as: Fruit ices (not with fruit pulp)                                     milk, soups, orange juice  Iced Popsicles                                    All solid food                                   Sports drinks like Gatorade Lightly seasoned clear broth or consume(fat free) Sugar, honey syrup  Sample Menu Breakfast                                Lunch                                     Supper Cranberry juice                    Beef broth                            Chicken broth Jell-O                                     Grape juice  Apple juice Coffee or tea                        Jell-O                                      Popsicle                                                Coffee or tea                        Coffee or tea  _____________________________________________________________________       Take these medicines the morning of surgery with A SIP OF WATER: NONE                                You may not have any metal on your body including hair pins and              piercings  Do not wear jewelry, make-up,  lotions, powders or perfumes, deodorant             Do not wear nail polish.  Do not shave  48 hours prior to surgery.                Do not bring valuables to the hospital. Welcome.  Contacts, dentures or bridgework may not be worn into surgery.  Leave suitcase in the car. After surgery it may be brought to your room.                 Please read over the following fact sheets you were given:   PAIN IS EXPECTED AFTER SURGERY AND WILL NOT BE COMPLETELY ELIMINATED. AMBULATION AND TYLENOL WILL HELP REDUCE INCISIONAL AND GAS PAIN. MOVEMENT IS KEY!  YOU ARE EXPECTED TO BE OUT OF BED WITHIN 4 HOURS OF ADMISSION TO YOUR PATIENT ROOM.  SITTING IN THE RECLINER THROUGHOUT THE DAY IS IMPORTANT FOR DRINKING FLUIDS AND MOVING GAS THROUGHOUT THE GI TRACT.  COMPRESSION STOCKINGS SHOULD BE WORN [place] UNLESS YOU ARE WALKING.   INCENTIVE SPIROMETER SHOULD BE USED EVERY HOUR WHILE AWAKE TO DECREASE POST-OPERATIVE COMPLICATIONS SUCH AS PNEUMONIA.  WHEN DISCHARGED HOME, IT IS IMPORTANT TO CONTINUE TO WALK EVERY HOUR AND USE THE INCENTIVE SPIROMETER EVERY HOUR.   _____________________________________________________________________             [facility] - Preparing for Surgery Before surgery, you can play an important role.  Because skin is not sterile, your skin needs to be as free of germs as possible.  You can reduce the number of germs on your skin by washing with CHG (chlorahexidine gluconate) soap before surgery.  CHG is an antiseptic cleaner which kills germs and bonds with the skin to continue killing germs even after washing. Please DO NOT use if you have an allergy to CHG or antibacterial soaps.  If your skin becomes reddened/irritated stop using the CHG and inform your nurse when you arrive at Short Stay. Do not shave (including legs and underarms)  for at least 48 hours prior to the first CHG shower.  You may shave  your face/neck. Please follow these instructions carefully:  1.  Shower with CHG Soap the night before surgery and the  morning of Surgery.  2.  If you choose to wash your hair, wash your hair first as usual with your  normal  shampoo.  3.  After you shampoo, rinse your hair and body thoroughly to remove the  shampoo.                           4.  Use CHG as you would any other liquid soap.  You can apply chg directly  to the skin and wash                       Gently with a scrungie or clean washcloth.  5.  Apply the CHG Soap to your body ONLY FROM THE NECK DOWN.   Do not use on face/ open                           Wound or open sores. Avoid contact with eyes, ears mouth and genitals (private parts).                       Wash face,  Genitals (private parts) with your normal soap.             6.  Wash thoroughly, paying special attention to the area where your surgery  will be performed.  7.  Thoroughly rinse your body with warm water from the neck down.  8.  DO NOT shower/wash with your normal soap after using and rinsing off  the CHG Soap.                9.  Pat yourself dry with a clean towel.            10.  Wear clean pajamas.            11.  Place clean sheets on your bed the night of your first shower and do not  sleep with pets. Day of Surgery : Do not apply any lotions/deodorants the morning of surgery.  Please wear clean clothes to the hospital/surgery center.  FAILURE TO FOLLOW THESE INSTRUCTIONS MAY RESULT IN THE CANCELLATION OF YOUR SURGERY PATIENT SIGNATURE_________________________________  NURSE SIGNATURE__________________________________  ________________________________________________________________________   [name]  An incentive spirometer is a tool that can help keep your lungs clear and active. This tool measures how well you are filling your lungs with each breath. Taking long deep breaths may help reverse or decrease the chance of developing  breathing (pulmonary) problems (especially infection) following:  A long period of time when you are unable to move or be active. BEFORE THE PROCEDURE   If the spirometer includes an indicator to show your best effort, your nurse or respiratory therapist will set it to a desired goal.  If possible, sit up straight or lean slightly forward. Try not to slouch.  Hold the incentive spirometer in an upright position. INSTRUCTIONS FOR USE  1. Sit on the edge of your bed if possible, or sit up as far as you can in bed or on a chair. 2. Hold the incentive spirometer in an upright position. 3. Breathe out normally. 4. Place the mouthpiece in your mouth and seal your lips  tightly around it. 5. Breathe in slowly and as deeply as possible, raising the piston or the ball toward the top of the column. 6. Hold your breath for 3-5 seconds or for as long as possible. Allow the piston or ball to fall to the bottom of the column. 7. Remove the mouthpiece from your mouth and breathe out normally. 8. Rest for a few seconds and repeat Steps 1 through 7 at least 10 times every 1-2 hours when you are awake. Take your time and take a few normal breaths between deep breaths. 9. The spirometer may include an indicator to show your best effort. Use the indicator as a goal to work toward during each repetition. 10. After each set of 10 deep breaths, practice coughing to be sure your lungs are clear. If you have an incision (the cut made at the time of surgery), support your incision when coughing by placing a pillow or rolled up towels firmly against it. Once you are able to get out of bed, walk around indoors and cough well. You may stop using the incentive spirometer when instructed by your caregiver.  RISKS AND COMPLICATIONS  Take your time so you do not get dizzy or light-headed.  If you are in pain, you may need to take or ask for pain medication before doing incentive spirometry. It is harder to take a deep  breath if you are having pain. AFTER USE  Rest and breathe slowly and easily.  It can be helpful to keep track of a log of your progress. Your caregiver can provide you with a simple table to help with this. If you are using the spirometer at home, follow these instructions: [place] IF:   You are having difficultly using the spirometer.  You have trouble using the spirometer as often as instructed.  Your pain medication is not giving enough relief while using the spirometer.  You develop fever of 100.5 F (38.1 C) or higher. SEEK IMMEDIATE MEDICAL CARE IF:   You cough up bloody sputum that had not been present before.  You develop fever of 102 F (38.9 C) or greater.  You develop worsening pain at or near the incision site. MAKE SURE YOU:   Understand these instructions.  Will watch your condition.  Will get help right away if you are not doing well or get worse. Document Released: 03/03/2007 Document Revised: 01/13/2012 Document Reviewed: 05/04/2007 [facility] Patient Information 2014 [place], [place].   ________________________________________________________________________

## 2018-09-25 ENCOUNTER — Encounter (HOSPITAL_COMMUNITY)
Admission: RE | Admit: 2018-09-25 | Discharge: 2018-09-25 | Disposition: A | Discharge status: Home / Self Care | Payer: 59 | Source: Ambulatory Visit | Attending: General Surgery | Admitting: General Surgery

## 2018-09-25 ENCOUNTER — Other Ambulatory Visit: Payer: Self-pay

## 2018-09-25 ENCOUNTER — Encounter (HOSPITAL_COMMUNITY): Payer: Self-pay

## 2018-09-25 DIAGNOSIS — K219 Gastro-esophageal reflux disease without esophagitis: Secondary | ICD-10-CM | POA: Diagnosis not present

## 2018-09-25 DIAGNOSIS — Z01812 Encounter for preprocedural laboratory examination: Secondary | ICD-10-CM

## 2018-09-25 DIAGNOSIS — E282 Polycystic ovarian syndrome: Secondary | ICD-10-CM | POA: Diagnosis not present

## 2018-09-25 DIAGNOSIS — Z79899 Other long term (current) drug therapy: Secondary | ICD-10-CM | POA: Diagnosis not present

## 2018-09-25 DIAGNOSIS — I10 Essential (primary) hypertension: Secondary | ICD-10-CM | POA: Diagnosis not present

## 2018-09-25 DIAGNOSIS — Z6841 Body Mass Index (BMI) 40.0 and over, adult: Secondary | ICD-10-CM | POA: Diagnosis not present

## 2018-09-25 HISTORY — DX: Hidradenitis suppurativa: L73.2

## 2018-09-25 HISTORY — DX: Dyspnea, unspecified: R06.00

## 2018-09-25 HISTORY — DX: Psoriasis, unspecified: L40.9

## 2018-09-25 LAB — COMPREHENSIVE METABOLIC PANEL
ALT: 52 U/L — ABNORMAL HIGH (ref 0–44)
AST: 27 U/L (ref 15–41)
Albumin: 4.5 g/dL (ref 3.5–5.0)
Alkaline Phosphatase: 47 U/L (ref 38–126)
Anion gap: 9 (ref 5–15)
BUN: 13 mg/dL (ref 6–20)
CO2: 25 mmol/L (ref 22–32)
Calcium: 9.5 mg/dL (ref 8.9–10.3)
Chloride: 105 mmol/L (ref 98–111)
Creatinine, Ser: 0.69 mg/dL (ref 0.44–1.00)
GFR calc Af Amer: >60 mL/min (ref >60)
GFR calc non Af Amer: >60 mL/min (ref >60)
Glucose, Bld: 102 mg/dL — ABNORMAL HIGH (ref 70–99)
Potassium: 4.2 mmol/L (ref 3.5–5.1)
Sodium: 139 mmol/L (ref 135–145)
Total Bilirubin: 0.9 mg/dL (ref 0.3–1.2)
Total Protein: 7.7 g/dL (ref 6.5–8.1)

## 2018-09-25 LAB — ABO/RH: ABO/RH(D): A POS

## 2018-09-25 LAB — CBC WITH DIFFERENTIAL/PLATELET
Abs Immature Granulocytes: 0.02 10*3/uL (ref 0.00–0.07)
Basophils Absolute: 0 10*3/uL (ref 0.0–0.1)
Basophils Relative: 0 %
Eosinophils Absolute: 0.1 10*3/uL (ref 0.0–0.5)
Eosinophils Relative: 1 %
HCT: 43.6 % (ref 36.0–46.0)
Hemoglobin: 14.1 g/dL (ref 12.0–15.0)
Immature Granulocytes: 0 %
Lymphocytes Relative: 32 %
Lymphs Abs: 2.6 10*3/uL (ref 0.7–4.0)
MCH: 29.4 pg (ref 26.0–34.0)
MCHC: 32.3 g/dL (ref 30.0–36.0)
MCV: 91 fL (ref 80.0–100.0)
Monocytes Absolute: 0.5 10*3/uL (ref 0.1–1.0)
Monocytes Relative: 7 %
Neutro Abs: 4.8 10*3/uL (ref 1.7–7.7)
Neutrophils Relative %: 60 %
Platelets: 292 10*3/uL (ref 150–400)
RBC: 4.79 MIL/uL (ref 3.87–5.11)
RDW: 13.3 % (ref 11.5–15.5)
WBC: 8 10*3/uL (ref 4.0–10.5)
nRBC: 0 % (ref 0.0–0.2)

## 2018-09-25 NOTE — H&P (Signed)
History of Present Illness [name] Kitchen [name] MD; 09/10/2018 10:57 AM) The patient is a 27 year old female who presents with obesity. She returns for her preoperative visit prior to planned laparoscopic sleeve gastrectomy. She has the daughter of a patient of ours to has had excellent weight loss post sleeve gastrectomy. The patient gives a history of progressive obesity since childhood despite multiple attempts at medical management. She had a serious left ankle fracture requiring surgery her senior year of high school and was in a wheelchair for several months. She gained a lot of weight and during that time. She has been through numerous efforts at multiple diet programs including medications and physician supervised efforts. She has been able to lose up to 90 pounds at a time but then experiences progressive weight regain. Obesity has been affecting the patient in a number of ways including difficulty with routine activities and increasing fatigue and shortness of breath with exertion. She has increasing left ankle pain at the site of her surgery.. Significant co-morbid illnesses have developed including polycystic ovarian syndrome with associated infertility and hypertension. The patient has been to our initial information seminar, researched surgical options thoroughly, and is interested in sleeve gastrectomy. She does have moderate reflux well controlled on ranitidine but she is unable to stay off this for more than a week or 2 due to worsening reflux symptoms. She has successfully completed her preoperative workup. No concerns of psychological nutrition evaluation. Upper GI series was normal with no hiatal hernia. Lab work and chest x-ray EKG all unremarkable.    Problem List/Past Medical [name] Fuentes Kosta, MD; 09/10/2018 10:57 AM) PRE-OP TESTING (Z01.818)  MORBID OBESITY WITH BMI OF 60.0-69.9, ADULT (E66.01)   Past Surgical History [name] Kitchen T. Krisha Beegle, MD; 09/10/2018 10:57  AM) Foot Surgery  Left. Oral Surgery  Tonsillectomy   Diagnostic Studies History [name] Kitchen T. Dreux Mcgroarty, MD; 09/10/2018 10:57 AM) Colonoscopy  never Mammogram  never Pap Smear  1-5 years ago  Allergies [name] Lorenzo, LPN; 40/0/8676 19:50 AM) No Known Drug Allergies [06/18/2018]:  Medication History [name] Kitchen T. Dicie Edelen, MD; 09/10/2018 10:57 AM) RaNITidine HCl (300MG  Capsule, Oral) Active. Zofran (8MG  Tablet, Oral) Active. Retin-A (0.01% Gel, External) Active. Vitamin C (100MG  Tablet Chewable, Oral) Active. Clobetasol Propionate (External) Specific strength unknown - Active. MetFORMIN HCl ER (500MG  Tablet ER 24HR, 1 Oral daily) Active. Spironolactone (50MG  Tablet, 1 Oral daily) Active. IUD's (Intrauterine) Specific strength unknown - Active. Medications Reconciled oxyCODONE HCl (5MG /5ML Solution, 5-10 Oral every four hours, as needed, Taken starting 09/10/2018) Active.  Social History [name] Kitchen T. Joseph Bias, MD; 09/10/2018 10:57 AM) Alcohol use  Occasional alcohol use. Caffeine use  Carbonated beverages, Tea. No drug use  Tobacco use  Never smoker.  Family History [name] Kitchen T. Birda Didonato, MD; 09/10/2018 10:57 AM) Alcohol Abuse  Father. Depression  Father. Hypertension  Brother, Father, Mother. Ischemic Bowel Disease  Mother.  Pregnancy / Birth History [name] Kitchen T. Mekaila Tarnow, MD; 09/10/2018 10:57 AM) Age at menarche  31 years. Contraceptive History  Intrauterine device. Gravida  0 Irregular periods  Para  0  Other Problems [name] Kitchen T. Itzayanna Kaster, MD; 09/10/2018 10:57 AM) Gastroesophageal Reflux Disease  High blood pressure   Vitals [name] Billings Dockery LPN; 93/12/6710 45:80 AM) 09/10/2018 10:12 AM Weight: 349.8 lb Height: 62.5in Body Surface Area: 2.44 m Body Mass Index: 62.96 kg/m  Temp.: 98.42F(Temporal)  Pulse: 67 (Regular)  BP: 122/76 (Sitting, Left Arm, Standard)       Physical Exam [name] Kitchen T. Natallia Stellmach MD; 09/10/2018 10:58  AM) The physical exam findings  are as follows: Note:General: Alert, morbidly obese Caucasian female, in no distress Skin: Warm and dry without rash or infection. HEENT: No palpable masses or thyromegaly. Sclera nonicteric. Pupils equal round and reactive. Lymph nodes: No cervical, supraclavicular, or inguinal nodes palpable. Lungs: Breath sounds clear and equal. No wheezing or increased work of breathing. Cardiovascular: Regular rate and rhythm without murmer. Abdomen: Nondistended. Soft and nontender. No masses palpable. No organomegaly. No palpable hernias. Extremities: No edema or joint swelling or deformity. No chronic venous stasis changes. Neurologic: Alert and fully oriented. Gait normal. No focal weakness. Psychiatric: Normal mood and affect. Thought content appropriate with normal judgement and insight    Assessment & Plan [name] Kitchen T. Camyla Camposano MD; 09/10/2018 11:00 AM) OBESITY, MORBID, BMI 50 OR HIGHER (E66.01) Impression: Patient with progressive severe morbid obesity unresponsive to multiple efforts at medical management who presents with a BMI of 62 and comorbidities of PCOS, hypertension and chronic joint pain. I believe there would be very significant medical benefit from surgical weight loss. After our discussion of surgical options currently available the patient has decided to proceed with laparoscopic sleeve gastrectomy due to the less extensive nature of the surgery and fewer long-term side effects. She does have reflux requiring medication but it is not severe and I think sleeve has numerous benefits in a young patient including less metabolic effects and ability to revise at a later date effectively compared to the bypass. She understands her reflux possibly could be made worse and requiring revision.. We have discussed the nature of the problem and the risks of remaining morbidly obese. We discussed laparoscopic sleeve gastrectomy in detail including the nature of the  procedure, expected hospitalization and recovery, and major risks of anesthetic complications, bleeding, blood clots and pulmonary emboli, leakage and infection and long-term risks of stricture, , bowel obstruction, reflux, nutritional deficiencies, and failure to lose weight or weight regain. We discussed these problems could lead to death. We discussed that the procedure is not reversible. We discussed possible need for conversion to gastric bypass or other procedure. She has successfully completed her preoperative workup. Upper GI series was normal. We reviewed the consent form and all her questions were answered today. She will be starting her preoperative diet soon. She is already on ranitidine and we gave her prescription for oxycodone.

## 2018-09-27 MED ORDER — BUPIVACAINE LIPOSOME 1.3 % IJ SUSP
20.0000 mL | INTRAMUSCULAR | Status: DC
  Filled 2018-09-27: qty 20

## 2018-09-28 ENCOUNTER — Encounter (HOSPITAL_COMMUNITY): Payer: Self-pay

## 2018-09-28 ENCOUNTER — Encounter (HOSPITAL_COMMUNITY)
Admission: RE | Disposition: A | Discharge status: Home / Self Care | Payer: Self-pay | Source: Ambulatory Visit | Attending: General Surgery

## 2018-09-28 ENCOUNTER — Inpatient Hospital Stay (HOSPITAL_COMMUNITY): Payer: 59 | Admitting: Anesthesiology

## 2018-09-28 ENCOUNTER — Other Ambulatory Visit: Payer: Self-pay

## 2018-09-28 ENCOUNTER — Inpatient Hospital Stay (HOSPITAL_COMMUNITY)
Admission: RE | Admit: 2018-09-28 | Discharge: 2018-09-29 | DRG: 621 | Disposition: A | Discharge status: Home / Self Care | Payer: 59 | Source: Ambulatory Visit | Attending: General Surgery | Admitting: General Surgery

## 2018-09-28 DIAGNOSIS — Z79899 Other long term (current) drug therapy: Secondary | ICD-10-CM | POA: Diagnosis not present

## 2018-09-28 DIAGNOSIS — K219 Gastro-esophageal reflux disease without esophagitis: Secondary | ICD-10-CM | POA: Diagnosis present

## 2018-09-28 DIAGNOSIS — I10 Essential (primary) hypertension: Secondary | ICD-10-CM | POA: Diagnosis present

## 2018-09-28 DIAGNOSIS — E282 Polycystic ovarian syndrome: Secondary | ICD-10-CM | POA: Diagnosis present

## 2018-09-28 DIAGNOSIS — Z6841 Body Mass Index (BMI) 40.0 and over, adult: Secondary | ICD-10-CM | POA: Diagnosis not present

## 2018-09-28 DIAGNOSIS — L409 Psoriasis, unspecified: Secondary | ICD-10-CM

## 2018-09-28 HISTORY — PX: LAPAROSCOPIC GASTRIC SLEEVE RESECTION: SHX5895

## 2018-09-28 LAB — HEMOGLOBIN AND HEMATOCRIT, BLOOD
HCT: 43.4 % (ref 36.0–46.0)
Hemoglobin: 14.1 g/dL (ref 12.0–15.0)

## 2018-09-28 LAB — TYPE AND SCREEN
ABO/RH(D): A POS
Antibody Screen: NEGATIVE

## 2018-09-28 LAB — GLUCOSE, CAPILLARY: Glucose-Capillary: 116 mg/dL — ABNORMAL HIGH (ref 70–99)

## 2018-09-28 LAB — PREGNANCY, URINE: Preg Test, Ur: NEGATIVE

## 2018-09-28 SURGERY — GASTRECTOMY, SLEEVE, LAPAROSCOPIC
Anesthesia: General

## 2018-09-28 MED ORDER — PANTOPRAZOLE SODIUM 40 MG IV SOLR
40.0000 mg | Freq: Every day | INTRAVENOUS | Status: DC
  Administered 2018-09-28: 40 mg via INTRAVENOUS
  Filled 2018-09-28: qty 40

## 2018-09-28 MED ORDER — PREMIER PROTEIN SHAKE
2.0000 [oz_av] | ORAL | Status: DC
  Administered 2018-09-29: 2 [oz_av] via ORAL

## 2018-09-28 MED ORDER — FENTANYL CITRATE (PF) 250 MCG/5ML IJ SOLN
INTRAMUSCULAR | Status: AC
  Filled 2018-09-28: qty 5

## 2018-09-28 MED ORDER — OXYCODONE HCL 5 MG/5ML PO SOLN: 5.0000 mg | ORAL | Status: DC | PRN

## 2018-09-28 MED ORDER — OXYCODONE HCL 5 MG PO TABS: 5.0000 mg | ORAL_TABLET | Freq: Once | ORAL | Status: DC | PRN

## 2018-09-28 MED ORDER — FENTANYL CITRATE (PF) 250 MCG/5ML IJ SOLN
INTRAMUSCULAR | Status: DC | PRN
  Administered 2018-09-28: 150 ug via INTRAVENOUS
  Administered 2018-09-28 (×2): 50 ug via INTRAVENOUS

## 2018-09-28 MED ORDER — LACTATED RINGERS IR SOLN
Status: DC | PRN
  Administered 2018-09-28: 1000 mL

## 2018-09-28 MED ORDER — MIDAZOLAM HCL 2 MG/2ML IJ SOLN
INTRAMUSCULAR | Status: DC | PRN
  Administered 2018-09-28: 2 mg via INTRAVENOUS

## 2018-09-28 MED ORDER — ENOXAPARIN SODIUM 30 MG/0.3ML ~~LOC~~ SOLN
30.0000 mg | Freq: Two times a day (BID) | SUBCUTANEOUS | Status: DC
  Administered 2018-09-28 – 2018-09-29 (×2): 30 mg via SUBCUTANEOUS
  Filled 2018-09-28 (×2): qty 0.3

## 2018-09-28 MED ORDER — SODIUM CHLORIDE (PF) 0.9 % IJ SOLN
INTRAMUSCULAR | Status: AC
  Filled 2018-09-28: qty 50

## 2018-09-28 MED ORDER — CHLORHEXIDINE GLUCONATE CLOTH 2 % EX PADS: 6.0000 | MEDICATED_PAD | Freq: Once | CUTANEOUS | Status: DC

## 2018-09-28 MED ORDER — KETAMINE HCL 10 MG/ML IJ SOLN
INTRAMUSCULAR | Status: DC | PRN
  Administered 2018-09-28: 80 mg via INTRAVENOUS

## 2018-09-28 MED ORDER — FENTANYL CITRATE (PF) 100 MCG/2ML IJ SOLN
INTRAMUSCULAR | Status: AC
  Filled 2018-09-28: qty 2

## 2018-09-28 MED ORDER — METFORMIN HCL ER 500 MG PO TB24
500.0000 mg | ORAL_TABLET | Freq: Every day | ORAL | Status: DC
  Filled 2018-09-28: qty 1

## 2018-09-28 MED ORDER — PROPOFOL 10 MG/ML IV BOLUS
INTRAVENOUS | Status: DC | PRN
  Administered 2018-09-28: 200 mg via INTRAVENOUS

## 2018-09-28 MED ORDER — GABAPENTIN 300 MG PO CAPS
300.0000 mg | ORAL_CAPSULE | ORAL | Status: AC
  Administered 2018-09-28: 300 mg via ORAL
  Filled 2018-09-28: qty 1

## 2018-09-28 MED ORDER — MORPHINE SULFATE (PF) 4 MG/ML IV SOLN
1.0000 mg | INTRAVENOUS | Status: DC | PRN
  Administered 2018-09-28: 3 mg via INTRAVENOUS

## 2018-09-28 MED ORDER — MIDAZOLAM HCL 2 MG/2ML IJ SOLN
INTRAMUSCULAR | Status: AC
  Filled 2018-09-28: qty 2

## 2018-09-28 MED ORDER — STERILE WATER FOR IRRIGATION IR SOLN
Status: DC | PRN
  Administered 2018-09-28: 1000 mL

## 2018-09-28 MED ORDER — POTASSIUM CHLORIDE IN NACL 20-0.45 MEQ/L-% IV SOLN
INTRAVENOUS | Status: DC
  Administered 2018-09-28 (×2): via INTRAVENOUS
  Filled 2018-09-28 (×4): qty 1000

## 2018-09-28 MED ORDER — HEPARIN SODIUM (PORCINE) 5000 UNIT/ML IJ SOLN
5000.0000 [IU] | INTRAMUSCULAR | Status: AC
  Administered 2018-09-28: 5000 [IU] via SUBCUTANEOUS
  Filled 2018-09-28: qty 1

## 2018-09-28 MED ORDER — LIDOCAINE 2% (20 MG/ML) 5 ML SYRINGE
INTRAMUSCULAR | Status: DC | PRN
  Administered 2018-09-28: 1.5 mg/kg/h via INTRAVENOUS

## 2018-09-28 MED ORDER — ACETAMINOPHEN 160 MG/5ML PO SOLN
650.0000 mg | Freq: Four times a day (QID) | ORAL | Status: DC
  Administered 2018-09-28: 650 mg via ORAL
  Filled 2018-09-28 (×3): qty 20.3

## 2018-09-28 MED ORDER — OXYCODONE HCL 5 MG/5ML PO SOLN: 5.0000 mg | Freq: Once | ORAL | Status: DC | PRN

## 2018-09-28 MED ORDER — PROPOFOL 10 MG/ML IV BOLUS
INTRAVENOUS | Status: AC
  Filled 2018-09-28: qty 20

## 2018-09-28 MED ORDER — DEXAMETHASONE SODIUM PHOSPHATE 10 MG/ML IJ SOLN
INTRAMUSCULAR | Status: AC
  Filled 2018-09-28: qty 1

## 2018-09-28 MED ORDER — MORPHINE SULFATE (PF) 4 MG/ML IV SOLN
INTRAVENOUS | Status: AC
  Filled 2018-09-28: qty 1

## 2018-09-28 MED ORDER — ROCURONIUM BROMIDE 100 MG/10ML IV SOLN
INTRAVENOUS | Status: AC
  Filled 2018-09-28: qty 1

## 2018-09-28 MED ORDER — BUPIVACAINE-EPINEPHRINE 0.25% -1:200000 IJ SOLN
INTRAMUSCULAR | Status: DC | PRN
  Administered 2018-09-28: 30 mL

## 2018-09-28 MED ORDER — LACTATED RINGERS IV SOLN
INTRAVENOUS | Status: DC
  Administered 2018-09-28 (×2): 1000 mL via INTRAVENOUS

## 2018-09-28 MED ORDER — APREPITANT 40 MG PO CAPS
40.0000 mg | ORAL_CAPSULE | ORAL | Status: AC
  Administered 2018-09-28: 40 mg via ORAL
  Filled 2018-09-28: qty 1

## 2018-09-28 MED ORDER — FENTANYL CITRATE (PF) 100 MCG/2ML IJ SOLN
25.0000 ug | INTRAMUSCULAR | Status: DC | PRN
  Administered 2018-09-28 (×3): 50 ug via INTRAVENOUS

## 2018-09-28 MED ORDER — SUGAMMADEX SODIUM 500 MG/5ML IV SOLN
INTRAVENOUS | Status: AC
  Filled 2018-09-28: qty 5

## 2018-09-28 MED ORDER — ROCURONIUM BROMIDE 100 MG/10ML IV SOLN
INTRAVENOUS | Status: DC | PRN
  Administered 2018-09-28: 60 mg via INTRAVENOUS
  Administered 2018-09-28: 10 mg via INTRAVENOUS

## 2018-09-28 MED ORDER — BUPIVACAINE LIPOSOME 1.3 % IJ SUSP
INTRAMUSCULAR | Status: DC | PRN
  Administered 2018-09-28: 20 mL

## 2018-09-28 MED ORDER — BUPIVACAINE-EPINEPHRINE (PF) 0.25% -1:200000 IJ SOLN
INTRAMUSCULAR | Status: AC
  Filled 2018-09-28: qty 30

## 2018-09-28 MED ORDER — CELECOXIB 200 MG PO CAPS
400.0000 mg | ORAL_CAPSULE | ORAL | Status: AC
  Administered 2018-09-28: 400 mg via ORAL
  Filled 2018-09-28: qty 2

## 2018-09-28 MED ORDER — SPIRONOLACTONE 25 MG PO TABS
50.0000 mg | ORAL_TABLET | Freq: Every day | ORAL | Status: DC
  Administered 2018-09-28: 50 mg via ORAL
  Filled 2018-09-28: qty 2

## 2018-09-28 MED ORDER — SCOPOLAMINE 1 MG/3DAYS TD PT72
1.0000 | MEDICATED_PATCH | TRANSDERMAL | Status: DC
  Administered 2018-09-28: 1.5 mg via TRANSDERMAL
  Filled 2018-09-28: qty 1

## 2018-09-28 MED ORDER — ONDANSETRON HCL 4 MG/2ML IJ SOLN: 4.0000 mg | Freq: Once | INTRAMUSCULAR | Status: DC | PRN

## 2018-09-28 MED ORDER — SUGAMMADEX SODIUM 200 MG/2ML IV SOLN
INTRAVENOUS | Status: DC | PRN
  Administered 2018-09-28: 350 mg via INTRAVENOUS

## 2018-09-28 MED ORDER — ONDANSETRON HCL 4 MG/2ML IJ SOLN
INTRAMUSCULAR | Status: AC
  Filled 2018-09-28: qty 2

## 2018-09-28 MED ORDER — SODIUM CHLORIDE 0.9 % IV SOLN
2.0000 g | INTRAVENOUS | Status: AC
  Administered 2018-09-28: 2 g via INTRAVENOUS
  Filled 2018-09-28: qty 2

## 2018-09-28 MED ORDER — DEXAMETHASONE SODIUM PHOSPHATE 10 MG/ML IJ SOLN
INTRAMUSCULAR | Status: DC | PRN
  Administered 2018-09-28: 10 mg via INTRAVENOUS

## 2018-09-28 MED ORDER — ONDANSETRON HCL 4 MG/2ML IJ SOLN: 4.0000 mg | INTRAMUSCULAR | Status: DC | PRN

## 2018-09-28 MED ORDER — LIDOCAINE 2% (20 MG/ML) 5 ML SYRINGE
INTRAMUSCULAR | Status: AC
  Filled 2018-09-28: qty 5

## 2018-09-28 MED ORDER — EVICEL 5 ML EX KIT
PACK | Freq: Once | CUTANEOUS | Status: AC
  Administered 2018-09-28: 1
  Filled 2018-09-28: qty 1

## 2018-09-28 MED ORDER — HYDRALAZINE HCL 20 MG/ML IJ SOLN: 20.0000 mg | INTRAMUSCULAR | Status: DC | PRN

## 2018-09-28 MED ORDER — LIDOCAINE 2% (20 MG/ML) 5 ML SYRINGE
INTRAMUSCULAR | Status: DC | PRN
  Administered 2018-09-28: 100 mg via INTRAVENOUS

## 2018-09-28 MED ORDER — DEXAMETHASONE SODIUM PHOSPHATE 4 MG/ML IJ SOLN: 4.0000 mg | INTRAMUSCULAR | Status: DC

## 2018-09-28 MED ORDER — SODIUM CHLORIDE (PF) 0.9 % IJ SOLN
INTRAMUSCULAR | Status: DC | PRN
  Administered 2018-09-28: 50 mL

## 2018-09-28 MED ORDER — 0.9 % SODIUM CHLORIDE (POUR BTL) OPTIME
TOPICAL | Status: DC | PRN
  Administered 2018-09-28: 1000 mL

## 2018-09-28 MED ORDER — ACETAMINOPHEN 500 MG PO TABS
1000.0000 mg | ORAL_TABLET | ORAL | Status: AC
  Administered 2018-09-28: 1000 mg via ORAL
  Filled 2018-09-28: qty 2

## 2018-09-28 MED ORDER — ONDANSETRON HCL 4 MG/2ML IJ SOLN
INTRAMUSCULAR | Status: DC | PRN
  Administered 2018-09-28: 4 mg via INTRAVENOUS

## 2018-09-28 SURGICAL SUPPLY — 75 items
ADH SKN CLS APL DERMABOND .7 (GAUZE/BANDAGES/DRESSINGS) ×1
APL SWBSTK 6 STRL LF DISP (MISCELLANEOUS)
APPLICATOR COTTON TIP 6 STRL (MISCELLANEOUS) IMPLANT
APPLICATOR COTTON TIP 6IN STRL (MISCELLANEOUS)
APPLIER CLIP ROT 10 11.4 M/L (STAPLE) ×2
APPLIER CLIP ROT 13.4 12 LRG (CLIP)
APR CLP LRG 13.4X12 ROT 20 MLT (CLIP)
APR CLP MED LRG 11.4X10 (STAPLE) ×1
BLADE SURG SZ11 CARB STEEL (BLADE) ×2 IMPLANT
CABLE HIGH FREQUENCY MONO STRZ (ELECTRODE) ×2 IMPLANT
CHLORAPREP W/TINT 26ML (MISCELLANEOUS) ×3 IMPLANT
CLIP APPLIE ROT 10 11.4 M/L (STAPLE) IMPLANT
CLIP APPLIE ROT 13.4 12 LRG (CLIP) IMPLANT
COVER WAND RF STERILE (DRAPES) ×1 IMPLANT
DERMABOND ADVANCED (GAUZE/BANDAGES/DRESSINGS) ×1
DERMABOND ADVANCED .7 DNX12 (GAUZE/BANDAGES/DRESSINGS) ×1 IMPLANT
DEVICE SUT QUICK LOAD TK 5 (STAPLE) IMPLANT
DEVICE SUT TI-KNOT TK 5X26 (MISCELLANEOUS) IMPLANT
DEVICE SUTURE ENDOST 10MM (ENDOMECHANICALS) IMPLANT
DRAPE UTILITY XL STRL (DRAPES) ×4 IMPLANT
ELECT REM PT RETURN 15FT ADLT (MISCELLANEOUS) ×2 IMPLANT
GLOVE BIO SURGEON STRL SZ 6.5 (GLOVE) ×1 IMPLANT
GLOVE BIOGEL M 8.0 STRL (GLOVE) ×1 IMPLANT
GLOVE BIOGEL PI IND STRL 6.5 (GLOVE) IMPLANT
GLOVE BIOGEL PI IND STRL 7.0 (GLOVE) IMPLANT
GLOVE BIOGEL PI IND STRL 7.5 (GLOVE) ×1 IMPLANT
GLOVE BIOGEL PI INDICATOR 6.5 (GLOVE) ×1
GLOVE BIOGEL PI INDICATOR 7.0 (GLOVE) ×2
GLOVE BIOGEL PI INDICATOR 7.5 (GLOVE) ×1
GLOVE ECLIPSE 7.5 STRL STRAW (GLOVE) ×2 IMPLANT
GOWN STRL REUS W/ TWL LRG LVL3 (GOWN DISPOSABLE) IMPLANT
GOWN STRL REUS W/TWL LRG LVL3 (GOWN DISPOSABLE) ×2
GOWN STRL REUS W/TWL XL LVL3 (GOWN DISPOSABLE) ×7 IMPLANT
GRASPER SUT TROCAR 14GX15 (MISCELLANEOUS) IMPLANT
HOVERMATT SINGLE USE (MISCELLANEOUS) ×2 IMPLANT
KIT BASIN OR (CUSTOM PROCEDURE TRAY) ×2 IMPLANT
KIT PRC NS LF DISP ENDO (KITS) IMPLANT
KIT PROCEDURE OLYMPUS (KITS) ×2
MARKER SKIN DUAL TIP RULER LAB (MISCELLANEOUS) ×2 IMPLANT
NDL SPNL 22GX3.5 QUINCKE BK (NEEDLE) ×1 IMPLANT
NEEDLE SPNL 22GX3.5 QUINCKE BK (NEEDLE) ×2 IMPLANT
PACK UNIVERSAL I (CUSTOM PROCEDURE TRAY) ×2 IMPLANT
RELOAD STAPLE 60 3.6 BLU REG (STAPLE) ×1 IMPLANT
RELOAD STAPLE 60 3.8 GOLD REG (STAPLE) ×1 IMPLANT
RELOAD STAPLE 60 4.1 GRN THCK (STAPLE) ×1 IMPLANT
RELOAD STAPLER BLUE 60MM (STAPLE) ×3 IMPLANT
RELOAD STAPLER GOLD 60MM (STAPLE) ×1 IMPLANT
RELOAD STAPLER GREEN 60MM (STAPLE) ×1 IMPLANT
SCISSORS LAP 5X45 EPIX DISP (ENDOMECHANICALS) ×2 IMPLANT
SET IRRIG TUBING LAPAROSCOPIC (IRRIGATION / IRRIGATOR) ×2 IMPLANT
SHEARS HARMONIC ACE PLUS 45CM (MISCELLANEOUS) ×2 IMPLANT
SLEEVE ADV FIXATION 5X100MM (TROCAR) ×2 IMPLANT
SLEEVE GASTRECTOMY 36FR VISIGI (MISCELLANEOUS) ×2 IMPLANT
SOLUTION ANTI FOG 6CC (MISCELLANEOUS) ×2 IMPLANT
SPONGE LAP 18X18 RF (DISPOSABLE) ×2 IMPLANT
STAPLER ECHELON LONG 60 440 (INSTRUMENTS) ×2 IMPLANT
STAPLER RELOAD BLUE 60MM (STAPLE) ×6
STAPLER RELOAD GOLD 60MM (STAPLE) ×2
STAPLER RELOAD GREEN 60MM (STAPLE) ×2
SUT MNCRL AB 4-0 PS2 18 (SUTURE) ×2 IMPLANT
SUT SURGIDAC NAB ES-9 0 48 120 (SUTURE) ×1 IMPLANT
SUT VIC AB 0 BRD 54 (SUTURE) IMPLANT
SYR 10ML ECCENTRIC (SYRINGE) ×2 IMPLANT
SYR 20CC LL (SYRINGE) ×4 IMPLANT
SYR 50ML LL SCALE MARK (SYRINGE) ×1 IMPLANT
SYSTEM WECK SHIELD CLOSURE (TROCAR) ×1 IMPLANT
TIP RIGID 35CM EVICEL (HEMOSTASIS) ×2 IMPLANT
TOWEL OR 17X26 10 PK STRL BLUE (TOWEL DISPOSABLE) ×2 IMPLANT
TOWEL OR NON WOVEN STRL DISP B (DISPOSABLE) ×2 IMPLANT
TROCAR ADV FIXATION 5X100MM (TROCAR) ×2 IMPLANT
TROCAR BLADELESS 15MM (ENDOMECHANICALS) ×2 IMPLANT
TROCAR BLADELESS OPT 5 100 (ENDOMECHANICALS) ×2 IMPLANT
TUBE CALIBRATION LAPBAND (TUBING) ×1 IMPLANT
TUBING CONNECTING 10 (TUBING) ×3 IMPLANT
TUBING INSUF HEATED (TUBING) ×2 IMPLANT

## 2018-09-28 NOTE — Progress Notes (Addendum)
Discussed post op day goals with patient including ambulation, diet progression, pain, and nausea control.  Questions answered.

## 2018-09-28 NOTE — Anesthesia Procedure Notes (Signed)
Procedure Name: Intubation Date/Time: 09/28/2018 10:11 AM Performed by: Sharlette Dense, CRNA Patient Re-evaluated:Patient Re-evaluated prior to induction Oxygen Delivery Method: Circle system utilized Preoxygenation: Pre-oxygenation with 100% oxygen Induction Type: IV induction Ventilation: Mask ventilation without difficulty and Oral airway inserted - appropriate to patient size Laryngoscope Size: Miller and 3 Grade View: Grade I Tube type: Oral Tube size: 7.5 mm Number of attempts: 1 Airway Equipment and Method: Stylet and Patient positioned with wedge pillow Placement Confirmation: ETT inserted through vocal cords under direct vision,  positive ETCO2 and breath sounds checked- equal and bilateral Secured at: 21 cm Tube secured with: Tape Dental Injury: Teeth and Oropharynx as per pre-operative assessment

## 2018-09-28 NOTE — Transfer of Care (Signed)
Immediate Anesthesia Transfer of Care Note  Patient: [name] Fuentes  Procedure(s) Performed: LAPAROSCOPIC GASTRIC SLEEVE RESECTION, UPPER ENDO, ERAS PATHWAY (N/A )  Patient Location: PACU  Anesthesia Type:General  Level of Consciousness: awake  Airway & Oxygen Therapy: Patient Spontanous Breathing and Patient connected to face mask oxygen  Post-op Assessment: Report given to RN and Post -op Vital signs reviewed and stable  Post vital signs: Reviewed and stable  Last Vitals:  Vitals Value Taken Time  BP    Temp    Pulse 86 09/28/2018 12:00 PM  Resp 23 09/28/2018 12:00 PM  SpO2 100 % 09/28/2018 12:00 PM  Vitals shown include unvalidated device data.  Last Pain:  Vitals:   09/28/18 0817  TempSrc:   PainSc: 0-No pain      Patients Stated Pain Goal: 3 (15/52/08 0223)  Complications: No apparent anesthesia complications

## 2018-09-28 NOTE — Progress Notes (Signed)
Started water and ice.

## 2018-09-28 NOTE — Interval H&P Note (Signed)
History and Physical Interval Note:  09/28/2018 8:55 AM  [name] Fuentes  has presented today for surgery, with the diagnosis of Morbid Obesity, PCOS, HTN, GERD  The various methods of treatment have been discussed with the patient and family. After consideration of risks, benefits and other options for treatment, the patient has consented to  Procedure(s): LAPAROSCOPIC GASTRIC SLEEVE RESECTION, UPPER ENDO, ERAS PATHWAY (N/A) as a surgical intervention .  The patient's history has been reviewed, patient examined, no change in status, stable for surgery.  I have reviewed the patient's chart and labs.  Questions were answered to the patient's satisfaction.     Darene Lamer Miah Boye

## 2018-09-28 NOTE — Discharge Instructions (Signed)
° ° ° °GASTRIC BYPASS/SLEEVE ° Home Care Instructions ° ° These instructions are to help you care for yourself when you go home. ° °Call: If you have any problems. °• Call [phone] and ask for the surgeon on call °• If you need immediate help, come to the ER at [facility].  °• Tell the ER staff that you are a new post-op gastric bypass or gastric sleeve patient °  °Signs and symptoms to report: • Severe vomiting or nausea °o If you cannot keep down clear liquids for longer than 1 day, call your surgeon  °• Abdominal pain that does not get better after taking your pain medication °• Fever over 100.4° F with chills °• Heart beating over 100 beats a minute °• Shortness of breath at rest °• Chest pain °•  Redness, swelling, drainage, or foul odor at incision (surgical) sites °•  If your incisions open or pull apart °• Swelling or pain in calf (lower leg) °• Diarrhea (Loose bowel movements that happen often), frequent watery, uncontrolled bowel movements °• Constipation, (no bowel movements for 3 days) if this happens: Pick one °o Milk of Magnesia, 2 tablespoons by mouth, 3 times a day for 2 days if needed °o Stop taking Milk of Magnesia once you have a bowel movement °o Call your doctor if constipation continues °Or °o Miralax  (instead of Milk of Magnesia) following the label instructions °o Stop taking Miralax once you have a bowel movement °o Call your doctor if constipation continues °• Anything you think is not normal °  °Normal side effects after surgery: • Unable to sleep at night or unable to focus °• Irritability or moody °• Being tearful (crying) or depressed °These are common complaints, possibly related to your anesthesia medications that put you to sleep, stress of surgery, and change in lifestyle.  This usually goes away a few weeks after surgery.  If these feelings continue, call your primary care doctor. °  °Wound Care: You may have surgical glue, steri-strips, or staples over your incisions after  surgery °• Surgical glue:  Looks like a clear film over your incisions and will wear off a little at a time °• Steri-strips: Strips of tape over your incisions. You may notice a yellowish color on the skin under the steri-strips. This is used to make the   steri-strips stick better. Do not pull the steri-strips off - let them fall off °• Staples: Staples may be removed before you leave the hospital °o If you go home with staples, call [facility], [phone] at for an appointment with your surgeon’s nurse to have staples removed 10 days after surgery. °• Showering: You may shower two (2) days after your surgery unless your surgeon tells you differently °o Wash gently around incisions with warm soapy water, rinse well, and gently pat dry  °o No tub baths until staples are removed, steri-strips fall off or glue is gone.  °  °Medications: • Medications should be liquid or crushed if larger than the size of a dime °• Extended release pills (medication that release a little bit at a time through the day) should NOT be crushed or cut. (examples include XL, ER, DR, SR) °• Depending on the size and number of medications you take, you may need to space (take a few throughout the day)/change the time you take your medications so that you do not over-fill your pouch (smaller stomach) °• Make sure you follow-up with your primary care doctor to   make medication changes needed during rapid weight loss and life-style changes °• If you have diabetes, follow up with the doctor that orders your diabetes medication(s) within one week after surgery and check your blood sugar regularly. °• Do not drive while taking prescription pain medication  °• It is ok to take Tylenol by the bottle instructions with your pain medicine or instead of your pain medicine as needed.  DO NOT TAKE NSAIDS (EXAMPLES OF NSAIDS:  IBUPROFREN/ NAPROXEN)  °Diet:                    First 2 Weeks ° You will see the dietician t about two (2) weeks  after your surgery. The dietician will increase the types of foods you can eat if you are handling liquids well: °• If you have severe vomiting or nausea and cannot keep down clear liquids lasting longer than 1 day, call your surgeon @ ([phone]) °Protein Shake °• Drink at least 2 ounces of shake 5-6 times per day °• Each serving of protein shakes (usually 8 - 12 ounces) should have: °o 15 grams of protein  °o And no more than 5 grams of carbohydrate  °• Goal for protein each day: °o Men = 80 grams per day °o Women = 60 grams per day °• Protein powder may be added to fluids such as non-fat milk or Lactaid milk or unsweetened Soy/Almond milk (limit to 35 grams added protein powder per serving) ° °Hydration °• Slowly increase the amount of water and other clear liquids as tolerated (See Acceptable Fluids) °• Slowly increase the amount of protein shake as tolerated  °•  Sip fluids slowly and throughout the day.  Do not use straws. °• May use sugar substitutes in small amounts (no more than 6 - 8 packets per day; i.e. Splenda) ° °Fluid Goal °• The first goal is to drink at least 8 ounces of protein shake/drink per day (or as directed by the nutritionist); some examples of protein shakes are Syntrax Nectar, Adkins Advantage, EAS Edge HP, and Unjury. See handout from pre-op Bariatric Education Class: °o Slowly increase the amount of protein shake you drink as tolerated °o You may find it easier to slowly sip shakes throughout the day °o It is important to get your proteins in first °• Your fluid goal is to drink 64 - 100 ounces of fluid daily °o It may take a few weeks to build up to this °• 32 oz (or more) should be clear liquids  °And  °• 32 oz (or more) should be full liquids (see below for examples) °• Liquids should not contain sugar, caffeine, or carbonation ° °Clear Liquids: °• Water or Sugar-free flavored water (i.e. Fruit H2O, Propel) °• Decaffeinated coffee or tea (sugar-free) °• Crystal Lite, Wyler’s Lite,  Minute Maid Lite °• Sugar-free Jell-O °• Bouillon or broth °• Sugar-free Popsicle:   *Less than 20 calories each; Limit 1 per day ° °Full Liquids: °Protein Shakes/Drinks + 2 choices per day of other full liquids °• Full liquids must be: °o No More Than 15 grams of Carbs per serving  °o No More Than 3 grams of Fat per serving °• Strained low-fat cream soup (except Cream of Potato or Tomato) °• Non-Fat milk °• Fat-free Lactaid Milk °• Unsweetened Soy Or Unsweetened Almond Milk °• Low Sugar yogurt (Dannon Lite & Fit, Greek yogurt; Oikos Triple Zero; Chobani Simply 100; Yoplait 100 calorie Greek - No Fruit on the Bottom) ° °  °Vitamins   and Minerals • Start 1 day after surgery unless otherwise directed by your surgeon °• 2 Chewable Bariatric Specific Multivitamin / Multimineral Supplement with iron (Example: Bariatric Advantage Multi EA) °• Chewable Calcium with Vitamin D-3 °(Example: 3 Chewable Calcium Plus 600 with Vitamin D-3) °o Take 500 mg three (3) times a day for a total of 1500 mg each day °o Do not take all 3 doses of calcium at one time as it may cause constipation, and you can only absorb 500 mg  at a time  °o Do not mix multivitamins containing iron with calcium supplements; take 2 hours apart °• Menstruating women and those with a history of anemia (a blood disease that causes weakness) may need extra iron °o Talk with your doctor to see if you need more iron °• Do not stop taking or change any vitamins or minerals until you talk to your dietitian or surgeon °• Your Dietitian and/or surgeon must approve all vitamin and mineral supplements °  °Activity and Exercise: Limit your physical activity as instructed by your doctor.  It is important to continue walking at home.  During this time, use these guidelines: °• Do not lift anything greater than ten (10) pounds for at least two (2) weeks °• Do not go back to work or drive until your surgeon says you can °• You may have sex when you feel comfortable  °o It is  VERY important for female patients to use a reliable birth control method; fertility often increases after surgery  °o All hormonal birth control will be ineffective for 30 days after surgery due to medications given during surgery a barrier method must be used. °o Do not get pregnant for at least 18 months °• Start exercising as soon as your doctor tells you that you can °o Make sure your doctor approves any physical activity °• Start with a simple walking program °• Walk 5-15 minutes each day, 7 days per week.  °• Slowly increase until you are walking 30-45 minutes per day °Consider joining our BELT program. [phone] or email [email or web address] °  °Special Instructions Things to remember: °• Use your CPAP when sleeping if this applies to you ° °• [facility] has two free Bariatric Surgery Support Groups that meet monthly °o The 3rd Thursday of each month, 6 pm, [facility]  °o The 2nd Friday of each month, 11:45 am in the private dining room in the basement of [facility] °• It is very important to keep all follow up appointments with your surgeon, dietitian, primary care physician, and behavioral health practitioner °• Routine follow up schedule with your surgeon include appointments at 2-3 weeks, 6-8 weeks, 6 months, and 1 year at a minimum.  Your surgeon may request to see you more often.   °o After the first year, please follow up with your bariatric surgeon and dietitian at least once a year in order to maintain best weight loss results °[facility]: [phone] °[facility]: [phone] °Bariatric Nurse Coordinator: [phone] °  °   Reviewed and Endorsed  °by [facility] Patient Education Committee, June, 2016 °Edits Approved: Aug, 2018 ° ° ° °

## 2018-09-28 NOTE — Op Note (Signed)
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 Hassell Done  M.D., FACS   Anesthesia: Gen.   Indications for procedure: This patient was undergoing a sleeve gastrectomy by Dr. Excell Seltzer and upper endo to check for leaks and bleeding and to validate anatomy.    Description of procedure: The endoscope was placed in the mouth and into the oropharynx and under endoscopic vision it was advanced to the esophagogastric junction at ~ 38 cm.  The pouch was insufflated and was found to be symmetrical and looked great down to the antrum .   No bleeding or leaks were detected.  The scope was withdrawn without difficulty.     Matt B. Hassell Done, MD, FACS General, Bariatric, & Minimally Invasive Surgery [facility], [place]

## 2018-09-28 NOTE — Anesthesia Postprocedure Evaluation (Signed)
Anesthesia Post Note  Patient: [name] Fuentes  Procedure(s) Performed: LAPAROSCOPIC GASTRIC SLEEVE RESECTION, UPPER ENDO, ERAS PATHWAY (N/A )     Patient location during evaluation: PACU Anesthesia Type: General Level of consciousness: awake and alert Pain management: pain level controlled Vital Signs Assessment: post-procedure vital signs reviewed and stable Respiratory status: spontaneous breathing, nonlabored ventilation and respiratory function stable Cardiovascular status: blood pressure returned to baseline and stable Postop Assessment: no apparent nausea or vomiting Anesthetic complications: no    Last Vitals:  Vitals:   09/28/18 1530 09/28/18 1704  BP: (!) 141/94 (!) 149/93  Pulse: 87 82  Resp: 15 20  Temp: 36.6 C (!) 36.4 C  SpO2: 100% 98%    Last Pain:  Vitals:   09/28/18 1800  TempSrc:   PainSc: 5                  Lidia Collum

## 2018-09-28 NOTE — Anesthesia Preprocedure Evaluation (Addendum)
Anesthesia Evaluation  Patient identified by MRN, date of birth, ID band Patient awake    Reviewed: Allergy & Precautions, NPO status , Patient's Chart, lab work & pertinent test results  History of Anesthesia Complications Negative for: history of anesthetic complications  Airway Mallampati: II  TM Distance: >3 FB Neck ROM: Full    Dental no notable dental hx.  Permanent upper retainer:   Pulmonary neg pulmonary ROS,    Pulmonary exam normal        Cardiovascular hypertension, negative cardio ROS Normal cardiovascular exam     Neuro/Psych negative neurological ROS  negative psych ROS   GI/Hepatic Neg liver ROS, GERD  Medicated,  Endo/Other  Morbid obesity  Renal/GU negative Renal ROS  negative genitourinary   Musculoskeletal negative musculoskeletal ROS (+)   Abdominal   Peds  Hematology negative hematology ROS (+)   Anesthesia Other Findings   Reproductive/Obstetrics                            Anesthesia Physical Anesthesia Plan  ASA: III  Anesthesia Plan: General   Post-op Pain Management:    Induction: Intravenous  PONV Risk Score and Plan: 4 or greater and Ondansetron, Dexamethasone, Midazolam and Treatment may vary due to age or medical condition  Airway Management Planned: Oral ETT  Additional Equipment: None  Intra-op Plan:   Post-operative Plan: Extubation in OR  Informed Consent: I have reviewed the patients History and Physical, chart, labs and discussed the procedure including the risks, benefits and alternatives for the proposed anesthesia with the patient or authorized representative who has indicated his/her understanding and acceptance.     Plan Discussed with:   Anesthesia Plan Comments:        Anesthesia Quick Evaluation

## 2018-09-28 NOTE — Op Note (Signed)
Preoperative Diagnosis: Morbid Obesity, PCOS, HTN, GERD  Postoprative Diagnosis: Morbid Obesity, PCOS, HTN, GERD  Procedure: Procedure(s): LAPAROSCOPIC GASTRIC SLEEVE RESECTION, UPPER ENDO, ERAS PATHWAY   Surgeon: Excell Seltzer T   Assistants: Johnathan Hausen  Anesthesia:  General endotracheal anesthesia  Indications: Patient with progressive severe morbid obesity unresponsive to multiple efforts at medical management who presents with a BMI of 62 and comorbidities of PCOS, hypertension and chronic joint pain.  After extensive preoperative evaluation and discussion detailed elsewhere we have elected to proceed with laparoscopic sleeve gastrectomy for treatment of her morbid obesity.    Procedure Detail: Patient was brought to the operating room, placed in supine position on the operating table, and general endotracheal anesthesia induced.  She had received preoperative IV antibiotics and subcutaneous heparin.  PAS were in place.  The abdomen was widely sterilely prepped and draped.  Patient timeout was performed and correct procedure verified.  Access was obtained with a 5 mm Optiview trocar in the left quadrants without difficulty.  Pneumoperitoneum established.  There was no evidence of trocar injury.  Under direct vision a 5 mm trocar was placed laterally in the right upper quadrant, a 15 mm trocar at the base of the falciform ligament and a 5 mm trocar just above into the left of the umbilicus for the camera port.  Through a 5 mm subxiphoid site the [facility] retractor was placed in the left lobe of the liver elevated with excellent exposure of the hiatus and entire stomach.  Finally a 5 mm trocar was placed laterally on the left upper quadrant.  A bilateral T AP block was performed with dilute Exparel.  We carefully examined the hiatus for evidence of a hernia and none was seen.  I did pass the calibration to the stomach and the balloon with 10 cc of air in the pullback snugly against the  hiatus with no evidence of hernia.  The dissection was begun at the mid greater curve with Harmonic Scalpel dividing vasculature and the lesser sac entered.  The dissection progressed proximally dividing short gastric vessels with the harmonic scalpel.  It was dissected away from the spleen without difficulty without any significant adhesions.  The dissection continued up along the left crus and the esophageal fat pad was mobilized in the left crus completely dissected.  Again no evidence of hiatal hernia seen.  After full proximal dissection we continued distally along the greater curve with Harmonic Scalpel dissecting and freeing the greater curve to a point measured 5 cm from the pylorus.  The 71 French G-tube was passed orally in place along the lesser curve and with the stomach splayed out symmetrically was placed on continuous suction.  The sleeve was begun with an initial firing of the 60 mm green load stapler beginning about 5 cm from the pylorus and angling well away from the incisura.  Following this a firing of the 60 mm gold load stapler was used to carry the staple line proximally again staying well away from the incisura allowing some extra room around the VISI G.  The sleeve was completed with 4 firings of the blue load 60 mm stapler staying closer to the VISI G-tube in the proximal stomach and the final firing at right angles to the fundus just off of the esophageal.  There was some slight using along the staple line was controlled with clips.  The sleeve was insufflated and under distention with saline irrigation there was no evidence of leak.  Was decompressed and the  VISI G removed.  Dr. Hassell Done performed upper endoscopy again showing no leak with insufflation, no bleeding and a nice symmetrical sleeve.  The staple line was coated with Evaseal.  The specimen was brought out through the 15 mm trocar site after dilating the slightly and this was closed with 0 Vicryl using the Weck closure device.   All CO2 was evacuated and trochars and Nathanson retractor removed under direct vision.  Skin incisions were further infiltrated with Exparel and closed with subcuticular Monocryl and Dermabond. sponge needle counts were correct.    Findings: As above  Estimated Blood Loss:  Minimal         Drains: None  Blood Given: none          Specimens: Greater curvature of stomach        Complications:  * No complications entered in OR log *         Disposition: PACU - hemodynamically stable.         Condition: stable

## 2018-09-28 NOTE — Progress Notes (Signed)
PHARMACY CONSULT FOR:  Risk Assessment for Post-Discharge VTE Following Bariatric Surgery  Post-Discharge VTE Risk Assessment: This patient's probability of 30-day post-discharge VTE is increased due to the factors marked:   Female    Age >/=60 years   X BMI >/=50 kg/m2    CHF    Dyspnea at Rest    Paraplegia   X Non-gastric-band surgery    Operation Time >/=3 hr    Return to OR     Length of Stay >/= 3 d   Predicted probability of 30-day post-discharge VTE: 0.27%  Recommendation for Discharge: No pharmacologic prophylaxis post-discharge  [name] Fuentes is a 27 y.o. female who underwent  Laparoscopic gastric sleeve resection, upper endo, eras pathway on 09/28/2018   Case start: 09/28/2018 @ 10:11 Case end: 09/28/2018 @ 12:01   Allergies  Allergen Reactions  . Metformin And Related Diarrhea    Patient Measurements: Height: 5' 2.5" (158.8 cm) Weight: (!) 343 lb (155.6 kg) IBW/kg (Calculated) : 51.25 Body mass index is 61.74 kg/m.  Recent Labs    09/28/18 1417  HGB 14.1  HCT 43.4   Estimated Creatinine Clearance: 157.8 mL/min (by C-G formula based on SCr of 0.69 mg/dL).    Past Medical History:  Diagnosis Date  . Dyspnea    on exertion   . GERD (gastroesophageal reflux disease)   . Hydradenitis   . Hypertension   . Obese   . PCOS (polycystic ovarian syndrome)   . Psoriasis      Medications Prior to Admission  Medication Sig Dispense Refill Last Dose  . Calcium Carb-Cholecalciferol (CALCIUM + VITAMIN D3 PO) Take 1 tablet by mouth daily.   09/25/2018  . ibuprofen (ADVIL,MOTRIN) 200 MG tablet Take 400 mg by mouth every 6 (six) hours as needed for headache or moderate pain.   09/14/2018  . Levonorgestrel (KYLEENA) 19.5 MG IUD 1 each by Intrauterine route once.     . metFORMIN (GLUCOPHAGE XR) 500 MG 24 hr tablet Take 1 tablet (500 mg total) by mouth daily with breakfast. (Patient taking differently: Take 500 mg by mouth at bedtime. ) 30 tablet 11   .  Multiple Vitamins-Minerals (MULTIVITAMIN PO) Take 1 tablet by mouth daily.   09/25/2018  . ranitidine (ZANTAC) 300 MG tablet TAKE 1 TABLET (300 MG TOTAL) BY MOUTH AT BEDTIME. 30 tablet 1 09/27/2018 at Unknown time  . spironolactone (ALDACTONE) 50 MG tablet Take 50 mg by mouth at bedtime.  1 09/27/2018 at Unknown time  . clobetasol cream (TEMOVATE) 8.52 % Apply 1 application topically 2 (two) times daily. (Patient taking differently: Apply 1 application topically 2 (two) times daily as needed (for psoriasis). ) 60 g 2 More than a month at Unknown time       Jeanny Rymer, Toribio Harbour, PharmD 09/28/2018,3:45 PM

## 2018-09-29 ENCOUNTER — Encounter (HOSPITAL_COMMUNITY): Payer: Self-pay | Admitting: General Surgery

## 2018-09-29 LAB — CBC WITH DIFFERENTIAL/PLATELET
Abs Immature Granulocytes: 0.05 10*3/uL (ref 0.00–0.07)
Basophils Absolute: 0 10*3/uL (ref 0.0–0.1)
Basophils Relative: 0 %
Eosinophils Absolute: 0 10*3/uL (ref 0.0–0.5)
Eosinophils Relative: 0 %
HCT: 44.3 % (ref 36.0–46.0)
Hemoglobin: 13.8 g/dL (ref 12.0–15.0)
Immature Granulocytes: 0 %
Lymphocytes Relative: 13 %
Lymphs Abs: 1.5 10*3/uL (ref 0.7–4.0)
MCH: 29.7 pg (ref 26.0–34.0)
MCHC: 31.2 g/dL (ref 30.0–36.0)
MCV: 95.5 fL (ref 80.0–100.0)
Monocytes Absolute: 0.4 10*3/uL (ref 0.1–1.0)
Monocytes Relative: 3 %
Neutro Abs: 10.3 10*3/uL — ABNORMAL HIGH (ref 1.7–7.7)
Neutrophils Relative %: 84 %
Platelets: 341 10*3/uL (ref 150–400)
RBC: 4.64 MIL/uL (ref 3.87–5.11)
RDW: 13.5 % (ref 11.5–15.5)
WBC: 12.3 10*3/uL — ABNORMAL HIGH (ref 4.0–10.5)
nRBC: 0 % (ref 0.0–0.2)

## 2018-09-29 NOTE — Plan of Care (Signed)

## 2018-09-29 NOTE — Discharge Summary (Signed)
Physician Discharge Summary  Patient ID: [name] Fuentes MRN: [id number] DOB/AGE: Aug 05, 1991 27 y.o.  PCP: Donella Stade, PA-C  Admit date: 09/28/2018 Discharge date: 09/29/2018  Admission Diagnoses:  obesity  Discharge Diagnoses:  same  Active Problems:   Morbid obesity with BMI of 60.0-69.9, adult [facility])   Surgery:  Lap sleeve gastrectomy  Discharged Condition: improved  Hospital Course:   Had sleeve gastrectomy by Dr. Excell Seltzer.  Kept overnight and was drinking adequately and was ready for discharge on PD 1  Consults: none  Significant Diagnostic Studies: none    Discharge Exam: Blood pressure 130/70, pulse 70, temperature 97.9 F (36.6 C), temperature source Oral, resp. rate 16, height 5' 2.5" (1.588 m), weight (!) 155.6 kg, SpO2 95 %. Incisions OK  Disposition: Discharge disposition: 01-Home or Self Care       Discharge Instructions    Ambulate hourly while awake   Complete by:  As directed    Call MD for:  difficulty breathing, headache or visual disturbances   Complete by:  As directed    Call MD for:  persistant dizziness or light-headedness   Complete by:  As directed    Call MD for:  persistant nausea and vomiting   Complete by:  As directed    Call MD for:  redness, tenderness, or signs of infection (pain, swelling, redness, odor or green/yellow discharge around incision site)   Complete by:  As directed    Call MD for:  severe uncontrolled pain   Complete by:  As directed    Call MD for:  temperature >101 F   Complete by:  As directed    Diet bariatric full liquid   Complete by:  As directed    Incentive spirometry   Complete by:  As directed    Perform hourly while awake     Allergies as of 09/29/2018      Reactions   Metformin And Related Diarrhea      Medication List    STOP taking these medications   CALCIUM + VITAMIN D3 PO   ibuprofen 200 MG tablet Commonly known as:  ADVIL,MOTRIN   metFORMIN 500 MG 24 hr tablet Commonly  known as:  GLUCOPHAGE-XR   MULTIVITAMIN PO     TAKE these medications   clobetasol cream 0.05 % Commonly known as:  TEMOVATE Apply 1 application topically 2 (two) times daily. What changed:    when to take this  reasons to take this   KYLEENA 19.5 MG Iud Generic drug:  Levonorgestrel 1 each by Intrauterine route once.   ranitidine 300 MG tablet Commonly known as:  ZANTAC TAKE 1 TABLET (300 MG TOTAL) BY MOUTH AT BEDTIME.   spironolactone 50 MG tablet Commonly known as:  ALDACTONE Take 50 mg by mouth at bedtime. Notes to patient:  Monitor Blood Pressure Daily and keep a log for primary care physician.  Monitor for symptoms of dehydration.  You may need to make changes to your medications with rapid weight loss.        Follow-up Information    Excell Seltzer, MD. Go on 10/16/2018.   Specialty:  General Surgery Why:  at 4 Contact information: [address] [place] [address] [phone]        Carlena Hurl, PA-C. Go on 11/10/2018.   Specialty:  General Surgery Why:  at 230 Contact information: [address] [place] [address] [phone]  Signed: Pedro Earls 09/29/2018, 11:36 AM

## 2018-09-29 NOTE — Progress Notes (Signed)
Patient alert and oriented, pain is controlled. Patient is tolerating fluids, advanced to protein shake today, patient is tolerating well.  Reviewed Gastric sleeve discharge instructions with patient and patient is able to articulate understanding.  Provided information on BELT program, Support Group and [facility] outpatient pharmacy. All questions answered, will continue to monitor.  Total fluid intake 825 Per dehydration protocol call back one week post op

## 2018-09-29 NOTE — Progress Notes (Signed)
Patient alert and oriented, Post op day 1.  Provided support and encouragement.  Encouraged pulmonary toilet, ambulation and small sips of liquids.  Completed 12 ounces of clear fluid and started protein shake.  All questions answered.  Will continue to monitor.

## 2018-09-30 ENCOUNTER — Other Ambulatory Visit: Payer: Self-pay | Admitting: *Deleted

## 2018-09-30 NOTE — Patient Outreach (Addendum)
[facility]) Care Management  09/30/2018  [name] Fuentes 1991-03-08 [id number]   Transition of care  Referral date:09/29/18 Referral source:Hospital Business Intelligence (BI) Report Insurance:[place] [employer]  Subjective: Successful outreach to [place] via telephone to complete transition of care call. See transition of care template for details.  She was hospitalized on 09/28/18 and had laparoscopic gastric sleeve resection, she was discharged home on 09/29/18. Plan: No care management needs identified so will send successful outreach letter to patient and close case to [place] [facility] services.  Barrington Ellison RN,CCM,CDE [place] Management Coordinator Office Phone [phone] Office Fax [phone]

## 2018-10-05 ENCOUNTER — Telehealth (HOSPITAL_COMMUNITY): Payer: Self-pay

## 2018-10-05 NOTE — Telephone Encounter (Signed)
Patient called to discuss post bariatric surgery follow up questions.  See below:   1.  Tell me about your pain and pain management?no pain since surgery  2.  Let's talk about fluid intake.  How much total fluid are you taking in?59 ounces of fluid  3.  How much protein have you taken in the last 2 days?30 grams of protein  4.  Have you had nausea?  Tell me about when have experienced nausea and what you did to help?no nausea since surgery  5.  Has the frequency or color changed with your urine?no problems urine light in color  6.  Tell me what your incisions look like?glue intact no problem  7.  Have you been passing gas? BM?passing gas had bm everyday  8.  If a problem or question were to arise who would you call?  Do you know contact numbers for [place], [facility], and [facility]?aware of how to contact all services  9.  How has the walking going?walking frequently  10.  How are your vitamins and calcium going?  How are you taking them?no problems with mvi or calcium

## 2018-10-12 ENCOUNTER — Telehealth: Payer: Self-pay | Admitting: Skilled Nursing Facility1

## 2018-10-12 NOTE — Telephone Encounter (Signed)
Dietitian returned pts missed call.  Pt states ever since Friday she has thrown up her protein shakes. Bought protein water which is better but with nausea. Pt states she walks around while she eats. Pt states she tried egg which went fine.  Dietitian advsied pt to start soft proteins today: Egg with or without the yolk Cheese Low fat refried beans Beans Lentils Soy crumbles Fish

## 2018-10-13 ENCOUNTER — Encounter: Payer: 59 | Attending: General Surgery | Admitting: Skilled Nursing Facility1

## 2018-10-13 DIAGNOSIS — Z713 Dietary counseling and surveillance: Secondary | ICD-10-CM | POA: Diagnosis present

## 2018-10-13 DIAGNOSIS — Z6841 Body Mass Index (BMI) 40.0 and over, adult: Secondary | ICD-10-CM | POA: Diagnosis not present

## 2018-10-13 DIAGNOSIS — E669 Obesity, unspecified: Secondary | ICD-10-CM

## 2018-10-14 ENCOUNTER — Ambulatory Visit (INDEPENDENT_AMBULATORY_CARE_PROVIDER_SITE_OTHER): Payer: 59 | Admitting: Physician Assistant

## 2018-10-14 ENCOUNTER — Encounter: Payer: Self-pay | Admitting: Physician Assistant

## 2018-10-14 VITALS — BP 122/88 | HR 100 | Wt 320.0 lb

## 2018-10-14 DIAGNOSIS — H7401 Tympanosclerosis, right ear: Secondary | ICD-10-CM

## 2018-10-14 DIAGNOSIS — H9201 Otalgia, right ear: Secondary | ICD-10-CM

## 2018-10-14 DIAGNOSIS — Z9884 Bariatric surgery status: Secondary | ICD-10-CM | POA: Diagnosis not present

## 2018-10-14 DIAGNOSIS — I10 Essential (primary) hypertension: Secondary | ICD-10-CM

## 2018-10-14 DIAGNOSIS — Z79899 Other long term (current) drug therapy: Secondary | ICD-10-CM

## 2018-10-14 DIAGNOSIS — K219 Gastro-esophageal reflux disease without esophagitis: Secondary | ICD-10-CM

## 2018-10-14 MED ORDER — RANITIDINE HCL 300 MG PO TABS: 300.0000 mg | ORAL_TABLET | Freq: Every day | ORAL | 0 refills | Status: DC

## 2018-10-14 MED ORDER — SPIRONOLACTONE 50 MG PO TABS: 50.0000 mg | ORAL_TABLET | Freq: Every day | ORAL | 1 refills | Status: DC

## 2018-10-14 NOTE — Patient Instructions (Signed)
For your blood pressure: - Goal <130/80 (Ideally 120's/70's) - take your blood pressure medication in the morning (unless instructed differently) - monitor and log blood pressures at home - check around the same time each day in a relaxed setting - Limit salt to <2500 mg/day - Try to get at least 150 minutes of aerobic exercise per week - Aim to go on a brisk walk 30 minutes per day at least 5 days per week. If you're not active, gradually increase how long you walk by 5 minutes each week - limit alcohol: 2 standard drinks per day for men and 1 per day for women - avoid tobacco/nicotine products. Consider smoking cessation if you smoke - weight loss: 7% of current body weight can reduce your blood pressure by 5-10 points - follow-up at least every 6 months for your blood pressure. Follow-up sooner if your BP is not controlled

## 2018-10-14 NOTE — Progress Notes (Signed)
Subjective:    Patient ID: [name] Fuentes, female    DOB: [birth year], 27 y.o.   MRN: [id number]  HPI Patient is a 27 year old female with history of HTN, GERD, PCOS, and morbid obesity is presenting today for a follow up of BP and ear pain after bariatric surgery on 09/28/18.  She is doing well after surgery. She is having some nausea and trouble reaching her goal protein and fluid intake. She is seeing a nutritionist and adjusting her diet slowly. She had N/V with protein shakes- she discontinued last weekend due to the vomiting. She is now eating a soft diet and is doing much better. Still having nausea but no vomiting. She reports that smells now make her nauseous as well. Her post-op visit is scheduled for Friday, Dec 13 w/ Dr. Excell Seltzer.  Would like refills on spironolactone and zantac. Metformin for PCOS was discontinued due to gastric sleeve. No concerns or issues w/ medications. BP was elevated before and during surgery. Highest elevation was 144/108 on 09/25/18. BP is good today. Denies chest pain, palpitations, SOB, headaches and dizziness.  She also reports right ear pain immediately after surgery and was told to follow up with PCP to investigate. She has a hx of a tympanoplasty. Had a sinus infection prior to surgery but did not receive treatment for it. She reports that pain has improved since surgery, but still has occasional pain. She used Ciprodex drops that she had at home and it seemed to help her symptoms. Denies fever, otorrhea, hearing loss, nasal congestion, sinus pressure.   Review of Systems  Constitutional: Negative for chills, fatigue and fever.  HENT: Positive for ear pain and hearing loss. Negative for congestion, ear discharge, postnasal drip, rhinorrhea, sinus pressure, sinus pain and sore throat.   Respiratory: Negative for chest tightness, shortness of breath and wheezing.   Cardiovascular: Negative for chest pain and palpitations.  Gastrointestinal: Positive  for nausea.  Neurological: Negative for dizziness and headaches.       Objective:   Physical Exam Constitutional:      General: She is not in acute distress.    Appearance: She is well-developed and well-nourished.  HENT:     Right Ear: Ear canal and external ear normal. Tympanic membrane is scarred and retracted.     Left Ear: Tympanic membrane, ear canal and external ear normal.  Cardiovascular:     Rate and Rhythm: Normal rate and regular rhythm.     Heart sounds: Normal heart sounds. No murmur. No friction rub. No gallop.   Pulmonary:     Effort: Pulmonary effort is normal.     Breath sounds: Normal breath sounds.  Neurological:     Mental Status: She is alert and oriented to person, place, and time.  Psychiatric:        Mood and Affect: Mood and affect normal.        Behavior: Behavior normal.        Thought Content: Thought content normal.        Judgment: Judgment normal.    Vitals:   10/14/18 1502  BP: 122/88  Pulse: 100    Lab Results  Component Value Date   CREATININE 0.69 09/25/2018   BUN 13 09/25/2018   NA 139 09/25/2018   K 4.2 09/25/2018   CL 105 09/25/2018   CO2 25 09/25/2018   Lab Results  Component Value Date   ALT 52 (H) 09/25/2018   AST 27 09/25/2018   ALKPHOS 47  09/25/2018   BILITOT 0.9 09/25/2018   Lab Results  Component Value Date   HGBA1C 5.3 09/26/2015   Lab Results  Component Value Date   CHOL 146 04/27/2018   HDL 41 (L) 04/27/2018   LDLCALC 85 04/27/2018   TRIG 100 04/27/2018   CHOLHDL 3.6 04/27/2018         Assessment & Plan:  Marland KitchenMarland KitchenDiagnoses and all orders for this visit:  Encounter for medication management  Gastroesophageal reflux disease without esophagitis -     Discontinue: ranitidine (ZANTAC) 300 MG tablet; Take 1 tablet (300 mg total) by mouth at bedtime. -     ranitidine (ZANTAC) 300 MG tablet; Take 1 tablet (300 mg total) by mouth at bedtime.  Status post laparoscopic sleeve gastrectomy  Hypertension goal BP  (blood pressure) < 130/80 -     Discontinue: spironolactone (ALDACTONE) 50 MG tablet; Take 1 tablet (50 mg total) by mouth daily. -     spironolactone (ALDACTONE) 50 MG tablet; Take 1 tablet (50 mg total) by mouth daily.  Otalgia, right  Tympanosclerosis of right ear  HTN BP Readings from Last 3 Encounters:  10/14/18 122/88  09/29/18 130/70  09/25/18 (!) 144/108  DBP mildly out of range Cont spironolactone 50 mg QD Patient to monitor and log BP's at home Counseled on therapuetic lifestyle changes Weight loss should also help with this  S/p gastric sleeve Wt Readings from Last 3 Encounters:  10/14/18 (!) 320 lb (145.2 kg)  10/15/18 (!) 320 lb 3.2 oz (145.2 kg)  09/28/18 (!) 343 lb (155.6 kg)  - down 23 lb - keep post-op appt with Dr. Kirstie Mirza, right: TM with diffuse scarring. Patient has history of tympanoplasty. Unable to register a reading with Tympanogram. Dr. Sheppard Coil also examined her right ear today. No evidence of acute OM  Counseled on management of eustachian tube dysfunction. Trial of intranasal corticosteroid. Consider referral to ENT if symptoms worsen or fail to improve in the next 8 weeks  Patient education and anticipatory guidance given Patient agrees with treatment plan Follow-up with PCP in 6 months or sooner as needed

## 2018-10-15 ENCOUNTER — Encounter: Payer: Self-pay | Admitting: Skilled Nursing Facility1

## 2018-10-15 NOTE — Progress Notes (Signed)
Bariatric Class:  Appt start time: 1530 end time:  1630.  2 Week Post-Operative Nutrition Class  Patient was seen on  for [facility]ative [facility]   Surgery date: 10/13/2018 Surgery type: sleeve Start weight at [facility]: 348.8 Weight today: 354.2  TANITA  BODY COMP RESULTS  10/13/2018   BMI (kg/m^2) 57.6   Fat Mass (lbs) 193.4   Fat Free Mass (lbs) 126.8   Total Body Water (lbs) N/A   The following the learning objectives were met by the patient during this course:  Identifies Phase 3A (Soft, High Proteins) Dietary Goals and will begin from 2 weeks post-operatively to 2 months post-operatively  Identifies appropriate sources of fluids and proteins   States protein recommendations and appropriate sources post-operatively  Identifies the need for appropriate texture modifications, mastication, and bite sizes when consuming solids  Identifies appropriate multivitamin and calcium sources post-operatively  Describes the need for physical activity post-operatively and will follow MD recommendations  States when to call healthcare provider regarding medication questions or post-operative complications  Handouts given during class include:  Phase 3A: Soft, High Protein Diet Handout  Follow-Up Plan: Patient will follow-up at [facility] in 6 weeks for 2 month post-op nutrition visit for diet advancement per MD.

## 2018-10-18 ENCOUNTER — Encounter: Payer: Self-pay | Admitting: Physician Assistant

## 2018-10-18 DIAGNOSIS — H9201 Otalgia, right ear: Secondary | ICD-10-CM | POA: Insufficient documentation

## 2018-10-18 DIAGNOSIS — H7401 Tympanosclerosis, right ear: Secondary | ICD-10-CM | POA: Insufficient documentation

## 2018-10-19 ENCOUNTER — Telehealth: Payer: Self-pay | Admitting: Skilled Nursing Facility1

## 2018-10-19 DIAGNOSIS — H52222 Regular astigmatism, left eye: Secondary | ICD-10-CM | POA: Diagnosis not present

## 2018-10-19 DIAGNOSIS — H5213 Myopia, bilateral: Secondary | ICD-10-CM | POA: Diagnosis not present

## 2018-10-19 NOTE — Telephone Encounter (Signed)
RD called pt to verify fluid intake once starting soft, solid proteins 2 week post-bariatric surgery.   Daily Fluid intake: 50 Daily Protein intake: 40  Concerns/issues:  Pt states she cannot do protein shakes and is not hungry. Pt states she has only been eating 3 times a day.   Dietitian advised the pt to eat every 3 hours and continue to aim to meet her needs: 60 grams of protein and 64 fluid ounces.

## 2018-10-23 ENCOUNTER — Telehealth: Payer: 59 | Admitting: Family

## 2018-10-23 DIAGNOSIS — J329 Chronic sinusitis, unspecified: Secondary | ICD-10-CM | POA: Diagnosis not present

## 2018-10-23 DIAGNOSIS — B9789 Other viral agents as the cause of diseases classified elsewhere: Secondary | ICD-10-CM

## 2018-10-23 MED ORDER — FLUTICASONE PROPIONATE 50 MCG/ACT NA SUSP: 1.0000 | Freq: Two times a day (BID) | NASAL | 6 refills | Status: DC

## 2018-10-23 NOTE — Progress Notes (Signed)
Thank you for the details you included in the comment boxes. Those details are very helpful in determining the best course of treatment for you and help [place] to provide the best care. The current standard of care is 5-7 days of illness before antibiotics are given. I wanted you to know this as it is the evidence-based guideline the E-Visit program operates by. However, approximately 89% of these infections are viral and they do respond very well to supportive care and measures to decrease inflammation, which we will provide for you below.   We are sorry that you are not feeling well.  Here is how we plan to help!  Based on what you have shared with me it looks like you have sinusitis.  Sinusitis is inflammation and infection in the sinus cavities of the head.  Based on your presentation I believe you most likely have Acute Viral Sinusitis.This is an infection most likely caused by a virus. There is not specific treatment for viral sinusitis other than to help you with the symptoms until the infection runs its course.  You may use an oral decongestant such as Mucinex D or if you have glaucoma or high blood pressure use plain Mucinex. Saline nasal spray help and can safely be used as often as needed for congestion, I have prescribed: Fluticasone nasal spray two sprays in each nostril once a day  Some authorities believe that zinc sprays or the use of Echinacea may shorten the course of your symptoms.  Sinus infections are not as easily transmitted as other respiratory infection, however we still recommend that you avoid close contact with loved ones, especially the very young and elderly.  Remember to wash your hands thoroughly throughout the day as this is the number one way to prevent the spread of infection!  Home Care:  Only take medications as instructed by your medical team.  Do not take these medications with alcohol.  A steam or ultrasonic humidifier can help congestion.  You can place a towel  over your head and breathe in the steam from hot water coming from a faucet.  Avoid close contacts especially the very young and the elderly.  Cover your mouth when you cough or sneeze.  Always remember to wash your hands.  Get Help Right Away If:  You develop worsening fever or sinus pain.  You develop a severe head ache or visual changes.  Your symptoms persist after you have completed your treatment plan.  Make sure you  Understand these instructions.  Will watch your condition.  Will get help right away if you are not doing well or get worse.  Your e-visit answers were reviewed by a board certified advanced clinical practitioner to complete your personal care plan.  Depending on the condition, your plan could have included both over the counter or prescription medications.  If there is a problem please reply  once you have received a response from your provider.  Your safety is important to [place].  If you have drug allergies check your prescription carefully.    You can use MyChart to ask questions about today's visit, request a non-urgent call back, or ask for a work or school excuse for 24 hours related to this e-Visit. If it has been greater than 24 hours you will need to follow up with your provider, or enter a new e-Visit to address those concerns.  You will get an e-mail in the next two days asking about your experience.  I hope that  your e-visit has been valuable and will speed your recovery. Thank you for using e-visits.

## 2018-10-24 DIAGNOSIS — J018 Other acute sinusitis: Secondary | ICD-10-CM | POA: Diagnosis not present

## 2018-10-25 ENCOUNTER — Emergency Department (HOSPITAL_COMMUNITY)
Admission: EM | Admit: 2018-10-25 | Discharge: 2018-10-25 | Disposition: A | Discharge status: Home / Self Care | Payer: 59 | Attending: Emergency Medicine | Admitting: Emergency Medicine

## 2018-10-25 ENCOUNTER — Encounter (HOSPITAL_COMMUNITY): Payer: Self-pay | Admitting: Emergency Medicine

## 2018-10-25 ENCOUNTER — Emergency Department (HOSPITAL_COMMUNITY): Payer: 59

## 2018-10-25 DIAGNOSIS — R109 Unspecified abdominal pain: Secondary | ICD-10-CM | POA: Diagnosis not present

## 2018-10-25 DIAGNOSIS — N202 Calculus of kidney with calculus of ureter: Secondary | ICD-10-CM | POA: Diagnosis not present

## 2018-10-25 DIAGNOSIS — I10 Essential (primary) hypertension: Secondary | ICD-10-CM | POA: Diagnosis not present

## 2018-10-25 DIAGNOSIS — R1031 Right lower quadrant pain: Secondary | ICD-10-CM | POA: Diagnosis present

## 2018-10-25 DIAGNOSIS — Z79899 Other long term (current) drug therapy: Secondary | ICD-10-CM | POA: Diagnosis not present

## 2018-10-25 DIAGNOSIS — N200 Calculus of kidney: Secondary | ICD-10-CM | POA: Insufficient documentation

## 2018-10-25 DIAGNOSIS — N390 Urinary tract infection, site not specified: Secondary | ICD-10-CM | POA: Insufficient documentation

## 2018-10-25 DIAGNOSIS — R112 Nausea with vomiting, unspecified: Secondary | ICD-10-CM | POA: Diagnosis not present

## 2018-10-25 DIAGNOSIS — R300 Dysuria: Secondary | ICD-10-CM | POA: Insufficient documentation

## 2018-10-25 LAB — COMPREHENSIVE METABOLIC PANEL
ALT: 58 U/L — ABNORMAL HIGH (ref 0–44)
AST: 36 U/L (ref 15–41)
Albumin: 4.4 g/dL (ref 3.5–5.0)
Alkaline Phosphatase: 46 U/L (ref 38–126)
Anion gap: 14 (ref 5–15)
BUN: 8 mg/dL (ref 6–20)
CO2: 22 mmol/L (ref 22–32)
Calcium: 9.7 mg/dL (ref 8.9–10.3)
Chloride: 106 mmol/L (ref 98–111)
Creatinine, Ser: 0.82 mg/dL (ref 0.44–1.00)
GFR calc Af Amer: >60 mL/min (ref >60)
GFR calc non Af Amer: >60 mL/min (ref >60)
Glucose, Bld: 107 mg/dL — ABNORMAL HIGH (ref 70–99)
Potassium: 3.5 mmol/L (ref 3.5–5.1)
Sodium: 142 mmol/L (ref 135–145)
Total Bilirubin: 0.8 mg/dL (ref 0.3–1.2)
Total Protein: 7.5 g/dL (ref 6.5–8.1)

## 2018-10-25 LAB — URINALYSIS, ROUTINE W REFLEX MICROSCOPIC
Bilirubin Urine: NEGATIVE
Glucose, UA: NEGATIVE mg/dL
Ketones, ur: 80 mg/dL — AB
Leukocytes, UA: NEGATIVE
Nitrite: NEGATIVE
Protein, ur: 100 mg/dL — AB
RBC / HPF: >50 RBC/hpf — ABNORMAL HIGH (ref 0–5)
Specific Gravity, Urine: 1.035 — ABNORMAL HIGH (ref 1.005–1.030)
WBC, UA: >50 WBC/hpf — ABNORMAL HIGH (ref 0–5)
pH: 6 (ref 5.0–8.0)

## 2018-10-25 LAB — CBC WITH DIFFERENTIAL/PLATELET
Abs Immature Granulocytes: 0.01 10*3/uL (ref 0.00–0.07)
Basophils Absolute: 0 10*3/uL (ref 0.0–0.1)
Basophils Relative: 1 %
Eosinophils Absolute: 0.1 10*3/uL (ref 0.0–0.5)
Eosinophils Relative: 2 %
HCT: 48 % — ABNORMAL HIGH (ref 36.0–46.0)
Hemoglobin: 16.1 g/dL — ABNORMAL HIGH (ref 12.0–15.0)
Immature Granulocytes: 0 %
Lymphocytes Relative: 49 %
Lymphs Abs: 2.7 10*3/uL (ref 0.7–4.0)
MCH: 29.9 pg (ref 26.0–34.0)
MCHC: 33.5 g/dL (ref 30.0–36.0)
MCV: 89.2 fL (ref 80.0–100.0)
Monocytes Absolute: 0.5 10*3/uL (ref 0.1–1.0)
Monocytes Relative: 10 %
Neutro Abs: 2 10*3/uL (ref 1.7–7.7)
Neutrophils Relative %: 38 %
Platelets: 253 10*3/uL (ref 150–400)
RBC: 5.38 MIL/uL — ABNORMAL HIGH (ref 3.87–5.11)
RDW: 15.7 % — ABNORMAL HIGH (ref 11.5–15.5)
WBC: 5.4 10*3/uL (ref 4.0–10.5)
nRBC: 0 % (ref 0.0–0.2)

## 2018-10-25 LAB — LIPASE, BLOOD: Lipase: 38 U/L (ref 11–51)

## 2018-10-25 LAB — I-STAT BETA HCG BLOOD, ED (MC, WL, AP ONLY): I-stat hCG, quantitative: <5 m[IU]/mL (ref <5)

## 2018-10-25 MED ORDER — KETOROLAC TROMETHAMINE 30 MG/ML IJ SOLN
15.0000 mg | Freq: Once | INTRAMUSCULAR | Status: AC
  Administered 2018-10-25: 15 mg via INTRAVENOUS
  Filled 2018-10-25: qty 1

## 2018-10-25 MED ORDER — ONDANSETRON HCL 4 MG/2ML IJ SOLN
4.0000 mg | Freq: Once | INTRAMUSCULAR | Status: AC
  Administered 2018-10-25: 4 mg via INTRAVENOUS
  Filled 2018-10-25: qty 2

## 2018-10-25 MED ORDER — ONDANSETRON 4 MG PO TBDP: 4.0000 mg | ORAL_TABLET | Freq: Three times a day (TID) | ORAL | 0 refills | Status: DC | PRN

## 2018-10-25 MED ORDER — TAMSULOSIN HCL 0.4 MG PO CAPS: 0.4000 mg | ORAL_CAPSULE | Freq: Every day | ORAL | 0 refills | Status: DC

## 2018-10-25 MED ORDER — MORPHINE SULFATE (PF) 2 MG/ML IV SOLN
2.0000 mg | Freq: Once | INTRAVENOUS | Status: AC
  Administered 2018-10-25: 2 mg via INTRAVENOUS
  Filled 2018-10-25: qty 1

## 2018-10-25 MED ORDER — HYDROCODONE-ACETAMINOPHEN 5-325 MG PO TABS: 1.0000 | ORAL_TABLET | Freq: Four times a day (QID) | ORAL | 0 refills | Status: DC | PRN

## 2018-10-25 MED ORDER — CEPHALEXIN 500 MG PO CAPS: ORAL_CAPSULE | ORAL | 0 refills | Status: DC

## 2018-10-25 MED ORDER — SODIUM CHLORIDE 0.9 % IV BOLUS
1000.0000 mL | Freq: Once | INTRAVENOUS | Status: AC
  Administered 2018-10-25: 1000 mL via INTRAVENOUS

## 2018-10-25 NOTE — ED Triage Notes (Signed)
Pt c/o right flank pain that started today. Reports that is on her menstrual cycle and having dysuria.

## 2018-10-25 NOTE — ED Provider Notes (Signed)
[facility] DEPT Provider Note   CSN: [id number] Arrival date & time: 10/25/18  1222     History   Chief Complaint Chief Complaint  Patient presents with  . Flank Pain  . Dysuria    HPI [name] Fuentes is a 27 y.o. female with a PMHx of PCOS, GERD, HTN, and other conditions listed below, who presents to the ED with complaints of right flank pain that began this morning and has progressively worsened.  She describes the pain as 9/10 constant achy and sharp right flank pain that radiates to the right lateral abdomen, worse with sitting down, and minimally improved with Tylenol.  She reports associated nausea and one episode of nonbloody nonbilious emesis as well as dysuria.  She has no personal history of kidney stones but her grandmother had a kidney stone in the past.  She is currently on her menstrual cycle.  She had a gastric sleeve procedure done on 09/28/18.  Of note, she is currently on amoxicillin for a sinus/ear infection.  She denies fevers, chills, CP, SOB, diarrhea/constipation, obstipation, melena, hematochezia, hematemesis, hematuria, urinary frequency, vaginal discharge, myalgias, arthralgias, numbness, tingling, focal weakness, or any other complaints at this time. Denies recent travel, sick contacts, suspicious food intake, EtOH use, or NSAID use.  The history is provided by the patient and medical records. No language interpreter was used.  Flank Pain  Associated symptoms include abdominal pain. Pertinent negatives include no chest pain and no shortness of breath.  Dysuria   Associated symptoms include nausea, vomiting and flank pain. Pertinent negatives include no chills, no frequency and no hematuria.    Past Medical History:  Diagnosis Date  . Dyspnea    on exertion   . GERD (gastroesophageal reflux disease)   . Hydradenitis   . Hypertension   . Obese   . PCOS (polycystic ovarian syndrome)   . Psoriasis     Patient Active  Problem List   Diagnosis Date Noted  . 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 Otalgia, right 10/18/2018  . 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 Morbid obesity with BMI of 60.0-69.9, adult ([place]) 09/28/2018  . Elevated ALT measurement 04/28/2018  . Elevated TSH 04/27/2018  . Body aches 04/27/2018  . Psoriasis 03/12/2017  . PCOS (polycystic ovarian syndrome) 03/12/2017  . Hypertension goal BP (blood pressure) < 130/80 03/12/2017  . Elevated blood pressure reading 03/12/2017  . Morbid obesity ([place]) 03/12/2017  . Gastroesophageal reflux disease without esophagitis 03/12/2017    Past Surgical History:  Procedure Laterality Date  . ANKLE RECONSTRUCTION     L ankle; plates and screws in place   . LAPAROSCOPIC GASTRIC SLEEVE RESECTION N/A 09/28/2018   Procedure: LAPAROSCOPIC GASTRIC SLEEVE RESECTION, UPPER ENDO, ERAS PATHWAY;  Surgeon: Excell Seltzer, MD;  Location: [facility];  Service: General;  Laterality: N/A;  . [place] Plasty    . TONSILLECTOMY    . WISDOM TOOTH EXTRACTION       OB History   No obstetric history on file.      Home Medications    Prior to Admission medications   Medication Sig Start Date End Date Taking? Authorizing Provider  acetaminophen (TYLENOL) 500 MG tablet Take 1,000 mg by mouth every 6 (six) hours as needed for moderate pain or headache.   Yes [provider]  amoxicillin (AMOXIL) 875 MG tablet Take 875 mg by mouth 2 (two) times daily. 10/24/18  Yes [provider]  clobetasol cream (TEMOVATE) 0.05 %  Apply 1 application topically 2 (two) times daily. Patient taking differently: Apply 1 application topically 2 (two) times daily as needed (for psoriasis).  03/12/17  Yes Breeback, Jade L, PA-C  fluticasone (FLONASE) 50 MCG/ACT nasal spray Place 1 spray into both nostrils 2 (two) times daily. 10/23/18  Yes Withrow, Elyse Jarvis, FNP  Levonorgestrel (KYLEENA) 19.5 MG IUD 1 each by Intrauterine route once.   Yes [provider]  nystatin cream (MYCOSTATIN) Apply 1 application topically 2 (two) times daily as needed for dry skin.   Yes [provider]  Pediatric Multivit-Minerals-C (FLINTSTONES COMPLETE PO) Take 1 tablet by mouth 2 (two) times daily.   Yes [provider]  ranitidine (ZANTAC) 300 MG tablet Take 1 tablet (300 mg total) by mouth at bedtime. 10/14/18  Yes Trixie Dredge, PA-C  spironolactone (ALDACTONE) 50 MG tablet Take 1 tablet (50 mg total) by mouth daily. 10/14/18  Yes Trixie Dredge, PA-C  vitamin B-12 (CYANOCOBALAMIN) 500 MCG tablet Take 500 mcg by mouth 2 (two) times daily.   Yes [provider]    Family History Family History  Problem Relation Age of Onset  . Hypertension Mother   . Hypertension Father   . Hypertension Brother   . Hypertension Maternal Grandfather   . Cancer Paternal Grandfather        liver  . Stroke Paternal Grandfather     Social History Social History   Tobacco Use  . Smoking status: Never Smoker  . Smokeless tobacco: Never Used  Substance Use Topics  . Alcohol use: No  . Drug use: No     Allergies   Metformin and related   Review of Systems Review of Systems  Constitutional: Negative for chills and fever.  Respiratory: Negative for shortness of breath.   Cardiovascular: Negative for chest pain.  Gastrointestinal: Positive for abdominal pain, nausea and vomiting. Negative for blood in stool, constipation and diarrhea.  Genitourinary: Positive for dysuria and flank pain. Negative for frequency, hematuria and vaginal discharge. Vaginal bleeding: on menses.  Musculoskeletal: Negative for arthralgias and myalgias.  Skin: Negative for color change.  Allergic/Immunologic: Negative for immunocompromised state.  Neurological: Negative for weakness and numbness.  Psychiatric/Behavioral: Negative for confusion.   All other systems reviewed and are negative for acute change except as noted in  the HPI.    Physical Exam Updated Vital Signs BP (!) 149/106 (BP Location: Right Arm)   Pulse 77   Temp (!) 97.3 F (36.3 C) (Oral)   Resp 18   LMP 10/15/2018 Comment: pregnancy test negative  SpO2 100%   Physical Exam Vitals signs and nursing note reviewed.  Constitutional:      General: She is not in acute distress.    Appearance: Normal appearance. She is well-developed. She is not toxic-appearing.     Comments: Afebrile, nontoxic, NAD  HENT:     Head: Normocephalic and atraumatic.  Eyes:     General:        Right eye: No discharge.        Left eye: No discharge.     Conjunctiva/sclera: Conjunctivae normal.  Neck:     Musculoskeletal: Normal range of motion and neck supple.  Cardiovascular:     Rate and Rhythm: Normal rate and regular rhythm.     Heart sounds: Normal heart sounds, S1 normal and S2 normal. No murmur. No friction rub. No gallop.   Pulmonary:     Effort: Pulmonary effort is normal. No respiratory distress.  Breath sounds: Normal breath sounds. No decreased breath sounds, wheezing, rhonchi or rales.  Abdominal:     General: Bowel sounds are normal. There is no distension.     Palpations: Abdomen is soft. Abdomen is not rigid.     Tenderness: There is abdominal tenderness in the right upper quadrant and right lower quadrant. There is right CVA tenderness. There is no left CVA tenderness, guarding or rebound. Negative signs include Murphy's sign and McBurney's sign.       Comments: Soft, obese but nondistended, +BS throughout, with mild RUQ and RLQ TTP but moderate R lateral abd TTP tracking towards the R flank region, no r/g/r, neg murphy's, no focal mcburney's point TTP, +R CVA TTP   Musculoskeletal: Normal range of motion.  Skin:    General: Skin is warm and dry.     Findings: No rash.  Neurological:     Mental Status: She is alert and oriented to person, place, and time.     Sensory: Sensation is intact. No sensory deficit.     Motor: Motor function  is intact.  Psychiatric:        Mood and Affect: Mood and affect normal.        Behavior: Behavior normal.      ED Treatments / Results  Labs (all labs ordered are listed, but only abnormal results are displayed) Labs Reviewed  URINALYSIS, ROUTINE W REFLEX MICROSCOPIC - Abnormal; Notable for the following components:      Result Value   APPearance TURBID (*)    Specific Gravity, Urine 1.035 (*)    Hgb urine dipstick LARGE (*)    Ketones, ur 80 (*)    Protein, ur 100 (*)    RBC / HPF >50 (*)    WBC, UA >50 (*)    Bacteria, UA RARE (*)    All other components within normal limits  CBC WITH DIFFERENTIAL/PLATELET - Abnormal; Notable for the following components:   RBC 5.38 (*)    Hemoglobin 16.1 (*)    HCT 48.0 (*)    RDW 15.7 (*)    All other components within normal limits  COMPREHENSIVE METABOLIC PANEL - Abnormal; Notable for the following components:   Glucose, Bld 107 (*)    ALT 58 (*)    All other components within normal limits  URINE CULTURE  LIPASE, BLOOD  I-STAT BETA HCG BLOOD, ED ([facility], [facility], [facility] ONLY)    EKG None  Radiology Ct Renal Stone Study  Result Date: 10/25/2018 CLINICAL DATA:  Right flank pain beginning today. EXAM: CT ABDOMEN AND PELVIS WITHOUT CONTRAST TECHNIQUE: Multidetector CT imaging of the abdomen and pelvis was performed following the standard protocol without IV contrast. COMPARISON:  None. FINDINGS: Lower chest: Lung bases are normal. Hepatobiliary: Gallbladder, liver and biliary tree are normal. Pancreas: Normal. Spleen: Normal. Adrenals/Urinary Tract: Adrenal glands are normal. Kidneys are normal in size. Punctate nonobstructing stone over the lower pole left kidney. No right renal stones visualized. There is mild right-sided hydronephrosis as there is a 2 mm stone over the right UVJ. Left ureter and bladder are normal. Stomach/Bowel: Surgical suture line over the greater curvature of the stomach compatible previous bypass surgery. Remainder of the  small bowel is normal. Appendix is normal. Minimal diverticulosis of the colon. Vascular/Lymphatic: Normal. Reproductive: IUD in adequate position over the uterus. Ovaries are normal. Other: None. Musculoskeletal: Normal. IMPRESSION: 2 mm stone over the right UVJ causing low-grade obstruction. Nonobstructing punctate stone over the lower pole left kidney. Previous  gastric bypass surgery. Minimal colonic diverticulosis. IUD in adequate position. Electronically Signed   By: Marin Olp M.D.   On: 10/25/2018 14:02    Procedures Procedures (including critical care time)  Medications Ordered in ED Medications  ondansetron (ZOFRAN) injection 4 mg (4 mg Intravenous Given 10/25/18 1341)  sodium chloride 0.9 % bolus 1,000 mL (0 mLs Intravenous Stopped 10/25/18 1659)  ketorolac (TORADOL) 30 MG/ML injection 15 mg (15 mg Intravenous Given 10/25/18 1340)  ketorolac (TORADOL) 30 MG/ML injection 15 mg (15 mg Intravenous Given 10/25/18 1457)  morphine 2 MG/ML injection 2 mg (2 mg Intravenous Given 10/25/18 1648)     Initial Impression / Assessment and Plan / ED Course  I have reviewed the triage vital signs and the nursing notes.  Pertinent labs & imaging results that were available during my care of the patient were reviewed by me and considered in my medical decision making (see chart for details).     27 y.o. female here with R flank pain radiating to R lateral abdomen with n/v starting this morning. On exam, mild RLQ and RUQ TTP with moderate R lateral abdomen TTP tracking towards the R flank area, +R CVA TTP, nonperitoneal abdomen, neg murphy's, no focal mcburney's point tenderness but more diffuse RLQ tenderness. DDx includes kidney stone vs cholecystitis vs appendicitis, etc. Will start with labs and CT renal study, pt wants to avoid narcotics for now so will start with toradol, zofran, fluids, and reassess.   5:01 PM CBC w/diff essentially unremarakble. CMP with marginally elevated ALT 58 but  otherwise unremarkable. Lipase WNL. U/A with no nitrites or leuks, >50 RBC and WBCs but rare bacteria; superimposed UTI is unlikely but given significant WBCs with clumping, will empirically cover for potential bacterial infection; UCx sent. BetaHCG neg. CT renal showing 38mm stone over R UVJ causing low-grade obstruction, and additional nonobstructing punctate stones over lower pole of left kidney, diverticulosis incidentally found as well. Pt feeling much better and tolerating PO well. Will send home with abx, zofran, norco, and flomax; pt can't take naprosyn so won't rx this. Urine strainer given. Advised staying hydrated. F/up with urology in 1-2wks but strict return precautions advised. I explained the diagnosis and have given explicit precautions to return to the ER including for any other new or worsening symptoms. The patient understands and accepts the medical plan as it's been dictated and I have answered their questions. Discharge instructions concerning home care and prescriptions have been given. The patient is STABLE and is discharged to home in good condition.    Final Clinical Impressions(s) / ED Diagnoses   Final diagnoses:  Nephrolithiasis  Non-intractable vomiting with nausea, unspecified vomiting type  Right flank pain  Lower urinary tract infectious disease    ED Discharge Orders         Ordered    cephALEXin (KEFLEX) 500 MG capsule     10/25/18 1637    HYDROcodone-acetaminophen (NORCO) 5-325 MG tablet  Every 6 hours PRN     10/25/18 1637    tamsulosin (FLOMAX) 0.4 MG CAPS capsule  Daily after supper     10/25/18 1637    ondansetron (ZOFRAN ODT) 4 MG disintegrating tablet  Every 8 hours PRN     10/25/18 [address] [place], [place] 10/25/18 1701    Duffy Bruce, MD 10/26/18 [phone]

## 2018-10-25 NOTE — ED Notes (Addendum)
Patient transported to CT 

## 2018-10-25 NOTE — ED Notes (Signed)
Pt attempted to void but could not provide urine sample.

## 2018-10-25 NOTE — Discharge Instructions (Addendum)
Take norco as directed as needed for pain. Do not drive or operate machinery with pain medication use. May need over-the-counter stool softener with this pain medication use. Use Zofran as needed for nausea. Use Flomax as directed, as this medication will help you pass the stone. Strain all urine to try to catch the stone when it passes. Take antibiotic as directed until completed. Follow-up with the urologist in the next 1 to 2 weeks for recheck of ongoing pain, however for intractable or uncontrollable symptoms at home then return to the [facility] emergency department.

## 2018-10-26 LAB — URINE CULTURE: Culture: <10000 — AB

## 2018-10-27 ENCOUNTER — Emergency Department (HOSPITAL_COMMUNITY): Payer: 59

## 2018-10-27 ENCOUNTER — Encounter (HOSPITAL_COMMUNITY): Payer: Self-pay | Admitting: Emergency Medicine

## 2018-10-27 ENCOUNTER — Emergency Department (HOSPITAL_COMMUNITY)
Admission: EM | Admit: 2018-10-27 | Discharge: 2018-10-27 | Disposition: A | Discharge status: Home / Self Care | Payer: 59 | Attending: Emergency Medicine | Admitting: Emergency Medicine

## 2018-10-27 DIAGNOSIS — E876 Hypokalemia: Secondary | ICD-10-CM | POA: Insufficient documentation

## 2018-10-27 DIAGNOSIS — I10 Essential (primary) hypertension: Secondary | ICD-10-CM | POA: Diagnosis not present

## 2018-10-27 DIAGNOSIS — Z79899 Other long term (current) drug therapy: Secondary | ICD-10-CM | POA: Insufficient documentation

## 2018-10-27 DIAGNOSIS — N132 Hydronephrosis with renal and ureteral calculous obstruction: Secondary | ICD-10-CM | POA: Diagnosis not present

## 2018-10-27 DIAGNOSIS — R112 Nausea with vomiting, unspecified: Secondary | ICD-10-CM | POA: Diagnosis not present

## 2018-10-27 DIAGNOSIS — N23 Unspecified renal colic: Secondary | ICD-10-CM | POA: Diagnosis not present

## 2018-10-27 DIAGNOSIS — R339 Retention of urine, unspecified: Secondary | ICD-10-CM | POA: Diagnosis present

## 2018-10-27 LAB — URINALYSIS, ROUTINE W REFLEX MICROSCOPIC
Bacteria, UA: NONE SEEN
Bilirubin Urine: NEGATIVE
Glucose, UA: NEGATIVE mg/dL
Ketones, ur: 80 mg/dL — AB
Nitrite: NEGATIVE
Protein, ur: 100 mg/dL — AB
RBC / HPF: >50 RBC/hpf — ABNORMAL HIGH (ref 0–5)
Specific Gravity, Urine: 1.035 — ABNORMAL HIGH (ref 1.005–1.030)
pH: 6 (ref 5.0–8.0)

## 2018-10-27 LAB — CBC
HCT: 43.7 % (ref 36.0–46.0)
Hemoglobin: 14.4 g/dL (ref 12.0–15.0)
MCH: 29.4 pg (ref 26.0–34.0)
MCHC: 33 g/dL (ref 30.0–36.0)
MCV: 89.4 fL (ref 80.0–100.0)
Platelets: 201 10*3/uL (ref 150–400)
RBC: 4.89 MIL/uL (ref 3.87–5.11)
RDW: 15.7 % — ABNORMAL HIGH (ref 11.5–15.5)
WBC: 6.5 10*3/uL (ref 4.0–10.5)
nRBC: 0 % (ref 0.0–0.2)

## 2018-10-27 LAB — COMPREHENSIVE METABOLIC PANEL
ALT: 59 U/L — ABNORMAL HIGH (ref 0–44)
AST: 38 U/L (ref 15–41)
Albumin: 4.3 g/dL (ref 3.5–5.0)
Alkaline Phosphatase: 48 U/L (ref 38–126)
Anion gap: 14 (ref 5–15)
BUN: 8 mg/dL (ref 6–20)
CO2: 23 mmol/L (ref 22–32)
Calcium: 9.1 mg/dL (ref 8.9–10.3)
Chloride: 105 mmol/L (ref 98–111)
Creatinine, Ser: 1.07 mg/dL — ABNORMAL HIGH (ref 0.44–1.00)
GFR calc Af Amer: >60 mL/min (ref >60)
GFR calc non Af Amer: >60 mL/min (ref >60)
Glucose, Bld: 95 mg/dL (ref 70–99)
Potassium: 3.1 mmol/L — ABNORMAL LOW (ref 3.5–5.1)
Sodium: 142 mmol/L (ref 135–145)
Total Bilirubin: 1 mg/dL (ref 0.3–1.2)
Total Protein: 6.9 g/dL (ref 6.5–8.1)

## 2018-10-27 LAB — LIPASE, BLOOD: Lipase: 35 U/L (ref 11–51)

## 2018-10-27 LAB — I-STAT BETA HCG BLOOD, ED (MC, WL, AP ONLY): I-stat hCG, quantitative: <5 m[IU]/mL (ref <5)

## 2018-10-27 MED ORDER — ONDANSETRON HCL 4 MG/2ML IJ SOLN
4.0000 mg | Freq: Once | INTRAMUSCULAR | Status: AC
  Administered 2018-10-27: 4 mg via INTRAVENOUS
  Filled 2018-10-27: qty 2

## 2018-10-27 MED ORDER — POTASSIUM CHLORIDE CRYS ER 20 MEQ PO TBCR
40.0000 meq | EXTENDED_RELEASE_TABLET | Freq: Once | ORAL | Status: AC
  Administered 2018-10-27: 40 meq via ORAL
  Filled 2018-10-27: qty 2

## 2018-10-27 MED ORDER — METOCLOPRAMIDE HCL 5 MG/ML IJ SOLN
10.0000 mg | Freq: Once | INTRAMUSCULAR | Status: AC
  Administered 2018-10-27: 10 mg via INTRAVENOUS
  Filled 2018-10-27: qty 2

## 2018-10-27 MED ORDER — SODIUM CHLORIDE 0.9 % IV BOLUS
1000.0000 mL | Freq: Once | INTRAVENOUS | Status: AC
  Administered 2018-10-27: 1000 mL via INTRAVENOUS

## 2018-10-27 MED ORDER — METOCLOPRAMIDE HCL 10 MG PO TABS: 10.0000 mg | ORAL_TABLET | Freq: Four times a day (QID) | ORAL | 0 refills | Status: DC

## 2018-10-27 MED ORDER — PROMETHAZINE HCL 25 MG RE SUPP: 25.0000 mg | Freq: Four times a day (QID) | RECTAL | 0 refills | Status: DC | PRN

## 2018-10-27 NOTE — ED Provider Notes (Signed)
[facility] DEPT Provider Note   CSN: [id number] Arrival date & time: 10/27/18  1216     History   Chief Complaint Chief Complaint  Patient presents with  . Abdominal Pain  . Nausea  . Emesis  . Urinary Retention    HPI [name] Fuentes is a 27 y.o. female.  27 year old female presents with complaint of nausea, vomiting, urinary urgency and sensation of incompletely emptying her bladder.  Patient was seen in the emergency room 2 days ago, diagnosed with 2 mm distal right ureteral stone with possible UTI.  Patient was discharged home with Keflex, hydrocodone, Zofran and Flomax.  Patient states that she is unable to tolerate anything by mouth despite taking her Zofran ODT.  Patient states the day after ER visit she felt like her pain had improved and that she had passed the kidney stone and then the symptoms she is having today began and she is concerned that maybe the kidney stone is stuck in her urethra.  Patient reports taking multiple trips to the bathroom and each time still able to pass a few drops of urine.  Denies changes in bowel habits, fevers or chills.  Patient reports slight right flank discomfort, reports taking Tylenol for this and denies significant pain.  No other complaints or concerns.     Past Medical History:  Diagnosis Date  . Dyspnea    on exertion   . GERD (gastroesophageal reflux disease)   . Hydradenitis   . Hypertension   . Obese   . PCOS (polycystic ovarian syndrome)   . Psoriasis     Patient Active Problem List   Diagnosis Date Noted  . 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 Otalgia, right 10/18/2018  . 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 Morbid obesity with BMI of 60.0-69.9, adult ([place]) 09/28/2018  . Elevated ALT measurement 04/28/2018  . Elevated TSH 04/27/2018  . Body aches 04/27/2018  . Psoriasis 03/12/2017  . PCOS (polycystic ovarian syndrome) 03/12/2017  . Hypertension goal  BP (blood pressure) < 130/80 03/12/2017  . Elevated blood pressure reading 03/12/2017  . Morbid obesity ([place]) 03/12/2017  . Gastroesophageal reflux disease without esophagitis 03/12/2017    Past Surgical History:  Procedure Laterality Date  . ANKLE RECONSTRUCTION     L ankle; plates and screws in place   . LAPAROSCOPIC GASTRIC SLEEVE RESECTION N/A 09/28/2018   Procedure: LAPAROSCOPIC GASTRIC SLEEVE RESECTION, UPPER ENDO, ERAS PATHWAY;  Surgeon: Excell Seltzer, MD;  Location: [facility];  Service: General;  Laterality: N/A;  . [place] Plasty    . TONSILLECTOMY    . WISDOM TOOTH EXTRACTION       OB History   No obstetric history on file.      Home Medications    Prior to Admission medications   Medication Sig Start Date End Date Taking? Authorizing Provider  acetaminophen (TYLENOL) 500 MG tablet Take 1,000 mg by mouth every 6 (six) hours as needed for moderate pain or headache.   Yes [provider]  cephALEXin (KEFLEX) 500 MG capsule 2 caps po bid x 7 days 10/25/18  Yes Street, [place], PA-C  fluticasone (FLONASE) 50 MCG/ACT nasal spray Place 1 spray into both nostrils 2 (two) times daily. 10/23/18  Yes [place], Elyse Jarvis, FNP  HYDROcodone-acetaminophen (NORCO) 5-325 MG tablet Take 1 tablet by mouth every 6 (six) hours as needed for severe pain. 10/25/18  Yes Street, [place], PA-C  Levonorgestrel (KYLEENA) 19.5 MG IUD 1 each by Intrauterine route  once.   Yes [provider]  nystatin cream (MYCOSTATIN) Apply 1 application topically 2 (two) times daily as needed for dry skin.   Yes [provider]  ondansetron (ZOFRAN ODT) 4 MG disintegrating tablet Take 1 tablet (4 mg total) by mouth every 8 (eight) hours as needed for nausea or vomiting. 10/25/18  Yes Street, [place], PA-C  Pediatric Multivit-Minerals-C (FLINTSTONES COMPLETE PO) Take 1 tablet by mouth 2 (two) times daily.   Yes [provider]  ranitidine (ZANTAC) 300 MG tablet Take 1 tablet (300  mg total) by mouth at bedtime. 10/14/18  Yes Trixie Dredge, PA-C  spironolactone (ALDACTONE) 50 MG tablet Take 1 tablet (50 mg total) by mouth daily. 10/14/18  Yes Trixie Dredge, PA-C  tamsulosin (FLOMAX) 0.4 MG CAPS capsule Take 1 capsule (0.4 mg total) by mouth daily after supper. Take until the stone passes, then stop taking 10/25/18  Yes Street, [place], [place]  vitamin B-12 (CYANOCOBALAMIN) 500 MCG tablet Take 500 mcg by mouth 2 (two) times daily.   Yes [provider]  clobetasol cream (TEMOVATE) 3.22 % Apply 1 application topically 2 (two) times daily. Patient not taking: Reported on 10/27/2018 03/12/17   Donella Stade, PA-C  metoCLOPramide (REGLAN) 10 MG tablet Take 1 tablet (10 mg total) by mouth every 6 (six) hours for 5 days. 10/27/18 11/01/18  Tacy Learn, PA-C  promethazine (PHENERGAN) 25 MG suppository Place 1 suppository (25 mg total) rectally every 6 (six) hours as needed for nausea or vomiting. 10/27/18   Tacy Learn, PA-C    Family History Family History  Problem Relation Age of Onset  . Hypertension Mother   . Hypertension Father   . Hypertension Brother   . Hypertension Maternal Grandfather   . Cancer Paternal Grandfather        liver  . Stroke Paternal Grandfather     Social History Social History   Tobacco Use  . Smoking status: Never Smoker  . Smokeless tobacco: Never Used  Substance Use Topics  . Alcohol use: No  . Drug use: No     Allergies   Metformin and related   Review of Systems Review of Systems  Constitutional: Negative for chills and fever.  Gastrointestinal: Positive for nausea and vomiting. Negative for abdominal distention, abdominal pain, constipation and diarrhea.  Genitourinary: Positive for frequency and urgency. Negative for dysuria.  Musculoskeletal: Negative for back pain.  Skin: Negative for rash and wound.  Allergic/Immunologic: Negative for immunocompromised state.  Hematological:  Does not bruise/bleed easily.  Psychiatric/Behavioral: Negative for confusion.  All other systems reviewed and are negative.    Physical Exam Updated Vital Signs BP (!) 169/101   Pulse (!) 47   Temp (!) 97.5 F (36.4 C) (Oral)   Resp 17   LMP 10/15/2018 Comment: pregnancy test negative  SpO2 100%   Physical Exam Vitals signs and nursing note reviewed.  Constitutional:      General: She is not in acute distress.    Appearance: She is well-developed. She is obese. She is not ill-appearing.  HENT:     Head: Normocephalic and atraumatic.  Cardiovascular:     Rate and Rhythm: Normal rate.     Heart sounds: Normal heart sounds. No murmur.  Pulmonary:     Effort: Pulmonary effort is normal.     Breath sounds: Normal breath sounds. No wheezing.  Abdominal:     General: There is no distension.     Palpations: Abdomen is soft.  Tenderness: There is abdominal tenderness in the suprapubic area. There is no right CVA tenderness or left CVA tenderness.     Comments: Reports pressure with palpation of suprapubic area  Skin:    General: Skin is warm and dry.     Findings: No rash.  Neurological:     Mental Status: She is alert and oriented to person, place, and time.  Psychiatric:        Mood and Affect: Mood normal.        Behavior: Behavior normal.      ED Treatments / Results  Labs (all labs ordered are listed, but only abnormal results are displayed) Labs Reviewed  COMPREHENSIVE METABOLIC PANEL - Abnormal; Notable for the following components:      Result Value   Potassium 3.1 (*)    Creatinine, Ser 1.07 (*)    ALT 59 (*)    All other components within normal limits  CBC - Abnormal; Notable for the following components:   RDW 15.7 (*)    All other components within normal limits  URINALYSIS, ROUTINE W REFLEX MICROSCOPIC - Abnormal; Notable for the following components:   APPearance TURBID (*)    Specific Gravity, Urine 1.035 (*)    Hgb urine dipstick LARGE (*)     Ketones, ur 80 (*)    Protein, ur 100 (*)    Leukocytes, UA TRACE (*)    RBC / HPF >50 (*)    All other components within normal limits  LIPASE, BLOOD  I-STAT BETA HCG BLOOD, ED ([facility], [facility], [facility] ONLY)    EKG None  Radiology US Renal  Result Date: 10/27/2018 CLINICAL DATA:  Initial evaluation for renal stone/renal colic. EXAM: RENAL / URINARY TRACT ULTRASOUND COMPLETE COMPARISON:  Prior CT from 10/25/2018. FINDINGS: Right Kidney: Renal measurements: 13.6 x 6.0 x 6.5 cm = volume: 279.8 mL . Echogenicity within normal limits. No mass lesion. Persistent mild to moderate right hydronephrosis, little interval changed relative to recent CT. No visible echogenic stone identified by ultrasound. Left Kidney: Renal measurements: 13.1 x 4.4 x 4.8 cm = volume: 142.4 mL. Echogenicity within normal limits. No mass or hydronephrosis visualized. Bladder: Bladder is incompletely distended and not well assessed. IMPRESSION: 1. Persistent mild-to-moderate right hydronephrosis, little interval changed relative to recent CT from 10/25/2018. No visible obstructive stone identified by sonography. 2. Normal left kidney. Electronically Signed   By: Jeannine Boga M.D.   On: 10/27/2018 18:02    Procedures Procedures (including critical care time)  Medications Ordered in ED Medications  sodium chloride 0.9 % bolus 1,000 mL (0 mLs Intravenous Stopped 10/27/18 1650)  ondansetron (ZOFRAN) injection 4 mg (4 mg Intravenous Given 10/27/18 1522)  potassium chloride SA (K-DUR,KLOR-CON) CR tablet 40 mEq (40 mEq Oral Given 10/27/18 1656)  sodium chloride 0.9 % bolus 1,000 mL (1,000 mLs Intravenous New Bag/Given 10/27/18 1656)  metoCLOPramide (REGLAN) injection 10 mg (10 mg Intravenous Given 10/27/18 1728)     Initial Impression / Assessment and Plan / ED Course  I have reviewed the triage vital signs and the nursing notes.  Pertinent labs & imaging results that were available during my care of the patient were  reviewed by me and considered in my medical decision making (see chart for details).  Clinical Course as of Oct 27 1833  Tue Oct 27, 2817  6143 27 year old female presents with complaint of urinary urgency and frequency with minimal urinary output.  Patient was in the ER 2 days ago diagnosed with 2 mm  distal right ureteral stone.  Patient felt he should pass the stone and is doing well and then developed bladder pressure with nausea and vomiting.  Reports flank discomfort with lying supine.  On exam patient experiences pressure in the suprapubic area otherwise abdomen is soft and nontender.  Review of lab work shows negative pregnancy test, mild hypokalemia with a potassium of 3.1, replaced orally, slight increase in her creatinine to 1.07 suspect secondary to vomiting and dehydration.  Urinalysis with large hemoglobin, trace leukocytes greater than 50 red cells, 0-5 white cells.  Patient was given Keflex for possible UTI, states she stopped know to keep this down due to her vomiting, does not appear to have infection at this time.  CBC is unremarkable.  Suspect patient has dehydration from her vomiting with ureteral colic secondary to passing a stone.  Ultrasound kidneys ordered patient's and patient's mother's request to evaluate for hydronephrosis.    [LM]  0981 Bladder scan post void with minimal residual urine present.    [LM]  1702 Review of urine culture from December 22 visit shows insufficient growth.   [LM]  1832 Patient reports relief of her nausea and vomiting with Reglan.  She is tolerating small sips of liquid.  Patient will be discharged home with prescription for Reglan, backup plan Phenergan suppository.  Return to ER for fever, pain, persistent vomiting or any other concerns otherwise follow-up with your doctor.  Discharge vital signs have improved, patient states she is not been taking her blood pressure medication for the past few days due to not feeling well and vomiting.   [LM]      Clinical Course User Index [LM] Tacy Learn, PA-C   Final Clinical Impressions(s) / ED Diagnoses   Final diagnoses:  Renal colic  Non-intractable vomiting with nausea, unspecified vomiting type  Hypokalemia    ED Discharge Orders         Ordered    metoCLOPramide (REGLAN) 10 MG tablet  Every 6 hours     10/27/18 1832    promethazine (PHENERGAN) 25 MG suppository  Every 6 hours PRN     10/27/18 1832           Tacy Learn, PA-C 10/27/18 Kendrick Fries, MD 10/28/18 [phone]

## 2018-10-27 NOTE — ED Triage Notes (Signed)
Patient here from home with complaints of nausea, vomiting. Reports unable to urinate. States that she was seen on 12/22 for kidney stone. Also reports recent bariatric surgery.

## 2018-10-27 NOTE — ED Notes (Addendum)
Bladder scan was performed on pt. Unable to read results due to bladder machine messing up. Results of scan was told to Dr. Percell Miller and nurse.

## 2018-10-27 NOTE — ED Notes (Signed)
PT request to have labs collect at IV start

## 2018-10-27 NOTE — Discharge Instructions (Addendum)
Home to rest, continue to push hydrating fluids like Gatorade or G2. Take Reglan as needed as prescribed for nausea and vomiting.  If this is not relieving the vomiting you may use a Phenergan suppository.  Return to ER for persistent vomiting, worsening pain, fever greater than 100.4 or any other concerning symptoms.

## 2018-10-29 ENCOUNTER — Ambulatory Visit (INDEPENDENT_AMBULATORY_CARE_PROVIDER_SITE_OTHER): Payer: 59

## 2018-10-29 ENCOUNTER — Encounter: Payer: Self-pay | Admitting: Osteopathic Medicine

## 2018-10-29 ENCOUNTER — Ambulatory Visit (INDEPENDENT_AMBULATORY_CARE_PROVIDER_SITE_OTHER): Payer: 59 | Admitting: Osteopathic Medicine

## 2018-10-29 VITALS — BP 146/75 | HR 52 | Temp 98.2°F | Wt 316.1 lb

## 2018-10-29 DIAGNOSIS — N132 Hydronephrosis with renal and ureteral calculous obstruction: Secondary | ICD-10-CM | POA: Diagnosis not present

## 2018-10-29 DIAGNOSIS — R1031 Right lower quadrant pain: Secondary | ICD-10-CM | POA: Diagnosis not present

## 2018-10-29 DIAGNOSIS — N200 Calculus of kidney: Secondary | ICD-10-CM | POA: Diagnosis not present

## 2018-10-29 MED ORDER — HYDROCODONE-ACETAMINOPHEN 5-325 MG PO TABS: 1.0000 | ORAL_TABLET | ORAL | 0 refills | Status: AC | PRN

## 2018-10-29 MED ORDER — METOCLOPRAMIDE HCL 10 MG PO TABS: 10.0000 mg | ORAL_TABLET | Freq: Four times a day (QID) | ORAL | 1 refills | Status: DC

## 2018-10-29 NOTE — Progress Notes (Signed)
HPI: [name] Fuentes is a 27 y.o. female who  has a past medical history of Dyspnea, GERD (gastroesophageal reflux disease), Hydradenitis, Hypertension, Obese, PCOS (polycystic ovarian syndrome), and Psoriasis.  she presents to [facility] today, 10/29/18,  for chief complaint of:  Abd pain - kidney stones  Seen in ER twice, Dx kidney stones Pain and nausea persistent  Reglan not really helping Hydrocodone 5-325 not really helping for longer than 2 hours, was given 10 pills  Not straining urine Not eating or drinking much Not taking Flomax     [place] 10/27/18 IMPRESSION: 1. Persistent mild-to-moderate right hydronephrosis, little interval changed relative to recent CT from 10/25/2018. No visible obstructive stone identified by sonography. 2. Normal left kidney.  CT 10/25/18 IMPRESSION: 2 mm stone over the right UVJ causing low-grade obstruction. Nonobstructing punctate stone over the lower pole left kidney.     At today's visit... Past medical history, surgical history, and family history reviewed and updated as needed.  Current medication list and allergy/intolerance information reviewed and updated as needed. (See remainder of HPI, ROS, Phys Exam below)     Results for orders placed or performed during the hospital encounter of 10/27/18 (from the past 72 hour(s))  Urinalysis, Routine w reflex microscopic     Status: Abnormal   Collection Time: 10/27/18 12:34 PM  Result Value Ref Range   Color, Urine YELLOW YELLOW   APPearance TURBID (A) CLEAR   Specific Gravity, Urine 1.035 (H) 1.005 - 1.030   pH 6.0 5.0 - 8.0   Glucose, UA NEGATIVE NEGATIVE mg/dL   Hgb urine dipstick LARGE (A) NEGATIVE   Bilirubin Urine NEGATIVE NEGATIVE   Ketones, ur 80 (A) NEGATIVE mg/dL   Protein, ur 100 (A) NEGATIVE mg/dL   Nitrite NEGATIVE NEGATIVE   Leukocytes, UA TRACE (A) NEGATIVE   RBC / HPF >50 (H) 0 - 5 RBC/hpf   WBC, UA 0-5 0 - 5 WBC/hpf   Bacteria, UA NONE SEEN NONE SEEN   Squamous Epithelial / LPF 6-10 0 - 5   Mucus PRESENT    Amorphous Crystal PRESENT     Comment: Performed at [facility], [place] [address], [place], [place] [address]  Lipase, blood     Status: None   Collection Time: 10/27/18  3:01 PM  Result Value Ref Range   Lipase 35 11 - 51 U/L    Comment: Performed at [facility], [place] [address] [place], [place] [address]  Comprehensive metabolic panel     Status: Abnormal   Collection Time: 10/27/18  3:01 PM  Result Value Ref Range   Sodium 142 135 - 145 mmol/L   Potassium 3.1 (L) 3.5 - 5.1 mmol/L   Chloride 105 98 - 111 mmol/L   CO2 23 22 - 32 mmol/L   Glucose, Bld 95 70 - 99 mg/dL   BUN 8 6 - 20 mg/dL   Creatinine, Ser 1.07 (H) 0.44 - 1.00 mg/dL   Calcium 9.1 8.9 - 10.3 mg/dL   Total Protein 6.9 6.5 - 8.1 g/dL   Albumin 4.3 3.5 - 5.0 g/dL   AST 38 15 - 41 U/L   ALT 59 (H) 0 - 44 U/L   Alkaline Phosphatase 48 38 - 126 U/L   Total Bilirubin 1.0 0.3 - 1.2 mg/dL   GFR calc non Af Amer >60 >60 mL/min   GFR calc Af Amer >60 >60 mL/min   Anion gap 14 5 - 15  Comment: Performed at [facility], [place] [address], [place], [place] [address]  CBC     Status: Abnormal   Collection Time: 10/27/18  3:01 PM  Result Value Ref Range   WBC 6.5 4.0 - 10.5 K/uL   RBC 4.89 3.87 - 5.11 MIL/uL   Hemoglobin 14.4 12.0 - 15.0 g/dL   HCT 43.7 36.0 - 46.0 %   MCV 89.4 80.0 - 100.0 fL   MCH 29.4 26.0 - 34.0 pg   MCHC 33.0 30.0 - 36.0 g/dL   RDW 15.7 (H) 11.5 - 15.5 %   Platelets 201 150 - 400 K/uL   nRBC 0.0 0.0 - 0.2 %    Comment: Performed at [facility], [place] [address], [place], [place] [address]  I-Stat beta hCG blood, ED     Status: None   Collection Time: 10/27/18  3:07 PM  Result Value Ref Range   I-stat hCG, quantitative <5.0 <5 mIU/mL   Comment 3            Comment:   GEST. AGE      CONC.  (mIU/mL)   <=1 WEEK        5 - 50     2  WEEKS       50 - 500     3 WEEKS       100 - 10,000     4 WEEKS     1,000 - 30,000        FEMALE AND NON-PREGNANT FEMALE:     LESS THAN 5 mIU/mL      Ct Renal Stone Study  Result Date: 10/29/2018 CLINICAL DATA:  Worsening right flank pain over the past 4 days. EXAM: CT ABDOMEN AND PELVIS WITHOUT CONTRAST TECHNIQUE: Multidetector CT imaging of the abdomen and pelvis was performed following the standard protocol without IV contrast. COMPARISON:  Renal ultrasound dated October 27, 2018. CT abdomen pelvis dated October 25, 2018. FINDINGS: Lower chest: No acute abnormality. Hepatobiliary: No focal liver abnormality is seen. No gallstones, gallbladder wall thickening, or biliary dilatation. Pancreas: Unremarkable. No pancreatic ductal dilatation or surrounding inflammatory changes. Spleen: Normal in size without focal abnormality. Adrenals/Urinary Tract: The adrenal glands are unremarkable. The right-sided ureteral 2 mm calculus is now at the right UVJ. Mild right hydronephrosis is stable. Unchanged punctate calculus in the lower pole of the left kidney. The bladder is decompressed. Stomach/Bowel: Prior sleeve gastrectomy. No bowel wall thickening, distention, or surrounding inflammatory changes. Normal appendix. Vascular/Lymphatic: No significant vascular findings are present. No enlarged abdominal or pelvic lymph nodes. Reproductive: Uterus and bilateral adnexa are unremarkable. IUD in the endometrial canal. Other: Unchanged tiny fat containing umbilical hernia. No free fluid or pneumoperitoneum. Musculoskeletal: No acute or significant osseous findings. IMPRESSION: 1. Previously seen 2 mm calculus at the right UPJ has now migrated to the right UVJ. Unchanged mild right hydronephrosis. 2. Unchanged nonobstructive punctate left nephrolithiasis. Electronically Signed   By: Titus Dubin M.D.   On: 10/29/2018 12:01        ASSESSMENT/PLAN: The primary encounter diagnosis was Renal stone. A diagnosis of  Right lower quadrant abdominal pain was also pertinent to this visit.   No worsening hydronephrosis or other concerning complication. Pt reassured that a 2 mm stone should pass, please take the Flomax, push oral fluids, can increase pain meds a bit and be sure to strain urine!   Orders Placed This Encounter  Procedures  . CT RENAL STONE STUDY  Meds ordered this encounter  Medications  . metoCLOPramide (REGLAN) 10 MG tablet    Sig: Take 1 tablet (10 mg total) by mouth every 6 (six) hours for 5 days.    Dispense:  20 tablet    Refill:  1  . HYDROcodone-acetaminophen (NORCO) 5-325 MG tablet    Sig: Take 1-2 tablets by mouth every 4 (four) hours as needed for up to 5 days for severe pain.    Dispense:  20 tablet    Refill:  0    Patient Instructions  Will get CT scan today If stone is still there, will get urology involved If stone is passed, and no other abnormality, will monitor and can refill pain medications short term  No urinary tract infection as of culture results 10/25/18      Follow-up plan: No follow-ups on file.                             ############################################ ############################################ ############################################ ############################################    Current Meds  Medication Sig  . acetaminophen (TYLENOL) 500 MG tablet Take 1,000 mg by mouth every 6 (six) hours as needed for moderate pain or headache.  . cephALEXin (KEFLEX) 500 MG capsule 2 caps po bid x 7 days  . clobetasol cream (TEMOVATE) 8.65 % Apply 1 application topically 2 (two) times daily.  . fluticasone (FLONASE) 50 MCG/ACT nasal spray Place 1 spray into both nostrils 2 (two) times daily.  Marland Kitchen HYDROcodone-acetaminophen (NORCO) 5-325 MG tablet Take 1 tablet by mouth every 6 (six) hours as needed for severe pain.  . Levonorgestrel (KYLEENA) 19.5 MG IUD 1 each by Intrauterine route once.  . metoCLOPramide  (REGLAN) 10 MG tablet Take 1 tablet (10 mg total) by mouth every 6 (six) hours for 5 days.  Marland Kitchen nystatin cream (MYCOSTATIN) Apply 1 application topically 2 (two) times daily as needed for dry skin.  . Pediatric Multivit-Minerals-C (FLINTSTONES COMPLETE PO) Take 1 tablet by mouth 2 (two) times daily.  . promethazine (PHENERGAN) 25 MG suppository Place 1 suppository (25 mg total) rectally every 6 (six) hours as needed for nausea or vomiting.  . ranitidine (ZANTAC) 300 MG tablet Take 1 tablet (300 mg total) by mouth at bedtime.  Marland Kitchen spironolactone (ALDACTONE) 50 MG tablet Take 1 tablet (50 mg total) by mouth daily.  . tamsulosin (FLOMAX) 0.4 MG CAPS capsule Take 1 capsule (0.4 mg total) by mouth daily after supper. Take until the stone passes, then stop taking  . vitamin B-12 (CYANOCOBALAMIN) 500 MCG tablet Take 500 mcg by mouth 2 (two) times daily.    Allergies  Allergen Reactions  . Metformin And Related Diarrhea       Review of Systems:  Constitutional: No recent illness  HEENT: No  headache, no vision change  Cardiac: No  chest pain, No  pressure, No palpitations  Respiratory:  No  shortness of breath. No  Cough  Gastrointestinal: +abdominal pain, no change on bowel habits  Musculoskeletal: No new myalgia/arthralgia  Skin: No  Rash  Neurologic: No  weakness, No  Dizziness  Exam:  BP (!) 146/75 (BP Location: Left Arm, Patient Position: Sitting, Cuff Size: Large)   Pulse (!) 52   Temp 98.2 F (36.8 C) (Oral)   Wt (!) 316 lb 1.6 oz (143.4 kg)   LMP 10/15/2018 Comment: pregnancy test negative  BMI 66.99 kg/m   Constitutional: VS see above. General Appearance: alert, well-developed, well-nourished, NAD  Eyes: Normal lids and  conjunctive, non-icteric sclera  Ears, Nose, Mouth, Throat: MMM, Normal external inspection ears/nares/mouth/lips/gums.  Neck: No masses, trachea midline.   Respiratory: Normal respiratory effort. no wheeze, no rhonchi, no rales  Cardiovascular:  S1/S2 normal, no murmur, no rub/gallop auscultated. RRR.   Musculoskeletal: Gait normal. Symmetric and independent movement of all extremities  Abdominal: non-tender, non-distended, no appreciable organomegaly, neg Murphy's, BS WNLx4, neg Lloyds sign   Neurological: Normal balance/coordination. No tremor.  Skin: warm, dry, intact.   Psychiatric: Normal judgment/insight. Normal mood and affect. Oriented x3.       Visit summary with medication list and pertinent instructions was printed for patient to review, patient was advised to alert [place] if any updates are needed. All questions at time of visit were answered - patient instructed to contact office with any additional concerns. ER/RTC precautions were reviewed with the patient and understanding verbalized.   Note: Total time spent 25 minutes, greater than 50% of the visit was spent face-to-face counseling and coordinating care for the following: The primary encounter diagnosis was Renal stone. A diagnosis of Right lower quadrant abdominal pain was also pertinent to this visit.Marland Kitchen  Please note: voice recognition software was used to produce this document, and typos may escape review. Please contact Dr. Sheppard Coil for any needed clarifications.    Follow up plan: Return if symptoms worsen or fail to improve.

## 2018-10-29 NOTE — Patient Instructions (Addendum)
Will get CT scan today If stone is still there, will get urology involved If stone is passed, and no other abnormality, will monitor and can refill pain medications short term  No urinary tract infection as of culture results 10/25/18

## 2018-11-09 DIAGNOSIS — N202 Calculus of kidney with calculus of ureter: Secondary | ICD-10-CM | POA: Diagnosis not present

## 2018-11-09 DIAGNOSIS — N201 Calculus of ureter: Secondary | ICD-10-CM | POA: Diagnosis not present

## 2018-11-19 ENCOUNTER — Encounter: Payer: Self-pay | Admitting: Dietician

## 2018-11-19 ENCOUNTER — Encounter: Payer: 59 | Attending: General Surgery | Admitting: Dietician

## 2018-11-19 VITALS — Wt 307.6 lb

## 2018-11-19 DIAGNOSIS — Z713 Dietary counseling and surveillance: Secondary | ICD-10-CM | POA: Insufficient documentation

## 2018-11-19 DIAGNOSIS — Z6841 Body Mass Index (BMI) 40.0 and over, adult: Secondary | ICD-10-CM | POA: Insufficient documentation

## 2018-11-19 DIAGNOSIS — E669 Obesity, unspecified: Secondary | ICD-10-CM

## 2018-11-19 NOTE — Progress Notes (Signed)
Bariatric Follow-Up Visit 8 Weeks Post-Operative Sleeve Gastrectomy Surgery Medical Nutrition Therapy  Appt Start Time: 11:20am End Time: 11:45am  Pt was seen on 11/19/2018 for Post-Operative Bariatric Surgery Nutrition Management.  Primary Concerns Today: Pt states she has not consumed much food or fluids between December 22nd and January 13th, during which time she visited the ER twice, passed 5 kidney stones, and experienced dehydration.  Pt seen by urologist to address kidney stones, pt states urologist advised diet consisting of low protein, adding in fruit, and low sodium.    NUTRITION ASSESSMENT  Anthropometrics  Start weight at [facility]: 348.8 lbs (Date: 08/05/2018) Today's weight: 307.6 lbs Weight change: -41.2 lbs  TANITA  Body Composition Results  10/13/2018 11/19/2018   BMI (kg/m2) 57.6 55.4   Fat Mass (lbs) 193.4 166.4   Fat Free Mass (lbs) 126.8 141.2   Total Body Water (lbs) - 106   Total Body Water (%) - 34.6   Clinical Medications: see list Supplements: Flintstone's, biotin, calcium citrate, B12  24-Hr Dietary Recall First Meal: cheese stick Snack: 1/4 container of yogurt  Second Meal: [employer] chili  Snack: halo orange   Third Meal: grilled chicken nuggets Snack: grapes  Beverages: water + lemon water + lime water + lemonade   Food & Nutrition Related Hx Dietary Hx: Pt states she could not consume food or fluids for a couple of weeks when she had kidney stones. Pt states that, since Monday, she has been able to tolerate small portions of food as well as some fluids. Pt states it is difficult for her to track her protein intake and she hasn't been too concerned with it anyway since the urologist advised her to consume a low protein diet. Pt states she does not take her bariatric MVI or Bariatric Advantage calcium supplements b/c she cannot tolerate them.  Estimated Daily Fluid Intake: 40 oz Estimated Daily Protein Intake: <40 g  Physical Activity  Current average  weekly physical activity: walking, going to the gym   Post-Op Goals Drinking while eating: no Chewing/swallowing difficulties: no Changes in vision: no Changes to mood/headaches: no Hair loss/changes to skin/nails: yes  Difficulty focusing/concentrating: no Sweating: no Dizziness/lightheadedness: no Palpitations: no  Carbonated/caffeinated beverages: no N/V/D/C/Gas: no Abdominal pain: yes Dumping syndrome: no Last Lap-Band fill: N/A   NUTRITION DIAGNOSIS  Overweight/obesity ([place]-3.3) related to past poor dietary habits and physical inactivity as evidenced by patient w/ completed Sleeve Gastrectomy surgery following dietary guidelines for continued weight loss.   NUTRITION INTERVENTION Nutrition counseling (C-1) and education (E-2) to facilitate bariatric surgery goals, including: . Diet advancement to the next phase (phase 4) now including non-starchy vegetables. o Due to pt's recent kidney stones, urologist recommended adding in fruits to diet. RD advised to continue keeping carbohydrate intake to a minimum (<15g/serving) and sugar intake to a minimum (<9g/serving.) o Still recommend minimum of 60g protein/day, as this is the minimum amount recommended for the bariatric population and is still relatively "low" protein amount which addresses urologist's concern to consume a low protein diet.  . The importance of consuming adequate calories as well as certain nutrients daily due to the body's need for essential vitamins, minerals, and fats. . The importance of daily physical activity and to reach a goal of at least 150 minutes of moderate to vigorous physical activity weekly (or as directed by their physician) due to benefits such as increased musculature and improved lab values.  Handouts Provided Include   Phase IV: Protein + Non-Starchy Vegetables  Learning Style & Readiness for Change Teaching method utilized: Visual & Auditory  Demonstrated degree of understanding via: Teach  Back  Barriers to learning/adherence to lifestyle change: None Identified (Pt is very motivated and feels much better, despite recent kidney stones and subsequent dehydration/hospitalization that have resulted in a setback from meeting goals.)   MONITORING & EVALUATION Dietary intake, weekly physical activity, body weight, and labs to ensure pt's current supplement choices are meeting nutritional needs.  Next Steps Patient is to follow-up in 4 months for 6 month post-op class. Patient is to call [facility] between now and next follow-up appointment as needed with questions or concerns.

## 2018-11-19 NOTE — Patient Instructions (Signed)
.   Continue to aim for a minimum of 64 fluid ounces 7 days a week with at least 30 ounces being plain water . Eat non-starchy vegetables 2 times a day 7 days a week . Start out with soft cooked vegetables today and tomorrow; if tolerated, begin to eat raw vegetables including salads . Per meal/snack, eat 3 ounces of protein first then start on non-starchy vegetables; once you understand how much of your meal leads to satisfaction (and not full) while still eating 3 ounces of protein and non-starchy vegetables, you can eat them in any order (figure out how much you can eat at a time to get enough but not too much)  . Continue to aim for 30 minutes of physical activity at least 5 times a week  See you at 6 Month Post Op Class!

## 2018-11-23 ENCOUNTER — Encounter: Payer: Self-pay | Admitting: Dietician

## 2018-11-26 ENCOUNTER — Ambulatory Visit: Payer: Self-pay | Admitting: Dietician

## 2018-11-26 ENCOUNTER — Ambulatory Visit (HOSPITAL_COMMUNITY)
Admission: RE | Admit: 2018-11-26 | Discharge: 2018-11-26 | Disposition: A | Discharge status: Home / Self Care | Payer: 59 | Source: Ambulatory Visit | Attending: General Surgery | Admitting: General Surgery

## 2018-11-26 ENCOUNTER — Other Ambulatory Visit: Payer: Self-pay | Admitting: General Surgery

## 2018-11-26 ENCOUNTER — Other Ambulatory Visit (HOSPITAL_COMMUNITY): Payer: Self-pay | Admitting: General Surgery

## 2018-11-26 DIAGNOSIS — R1011 Right upper quadrant pain: Secondary | ICD-10-CM

## 2018-11-26 DIAGNOSIS — K8020 Calculus of gallbladder without cholecystitis without obstruction: Secondary | ICD-10-CM | POA: Diagnosis not present

## 2018-11-26 MED FILL — ONDANSETRON HCL 4 MG TABLET: 4 | 4 days supply | Qty: 15 | Fill #0

## 2018-11-27 ENCOUNTER — Ambulatory Visit: Payer: Self-pay | Admitting: General Surgery

## 2018-11-27 ENCOUNTER — Other Ambulatory Visit: Payer: Self-pay

## 2018-11-30 ENCOUNTER — Ambulatory Visit: Payer: Self-pay | Admitting: Gastroenterology

## 2018-11-30 ENCOUNTER — Encounter (HOSPITAL_COMMUNITY): Payer: Self-pay | Admitting: *Deleted

## 2018-11-30 ENCOUNTER — Other Ambulatory Visit: Payer: Self-pay

## 2018-11-30 MED ORDER — DEXTROSE 5 % IV SOLN
3.0000 g | INTRAVENOUS | Status: AC
  Administered 2018-12-01: 3 g via INTRAVENOUS
  Filled 2018-11-30: qty 3

## 2018-11-30 MED ORDER — BUPIVACAINE LIPOSOME 1.3 % IJ SUSP
20.0000 mL | INTRAMUSCULAR | Status: DC
  Filled 2018-11-30: qty 20

## 2018-11-30 NOTE — H&P (Signed)
History of Present Illness [name] Fuentes T. [name] Masoner MD; 11/26/2018 10:55 AM) The patient is a 28 year old female presenting status-post bariatric surgery. She returns status post laparoscopic sleeve gastrectomy on 09/28/18. Had generally been doing well. She had no problems early on. She then developed kidney stones with right-sided back pain. She was treated by urology and passed a number of kidney stones. These symptoms all resolved. However over the last couple of days she has had postprandial epigastric and right upper quadrant pain. Describes crampy or twisty pain about 30 minutes after eating. Last night the pain became very intense in her epigastrium and right upper quadrant radiating to her right shoulder. Associated with vomiting 1. She took 1 hydrocodone she had left over from her kidney stones which helped. This morning the pain is better but still present at about a 4 out of 10 slight nausea. No fever or chills.   Problem List/Past Medical [name] Fuentes T. 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 [name] Calico, MD; 11/26/2018 10:58 AM) Foot Surgery  Left. Oral Surgery  Tonsillectomy   Diagnostic Studies History [name] Fuentes T. [name] Gironda, MD; 11/26/2018 10:58 AM) Colonoscopy  never Mammogram  never Pap Smear  1-5 years ago  Allergies ([name] Fuentes, Amaya; 11/26/2018 10:41 AM) No Known Drug Allergies [06/18/2018]: Allergies Reconciled   Medication History [name] Fuentes T. [name] Zieger, MD; 11/26/2018 10:58 AM) Metoclopramide HCl (10MG  Tablet, Oral) Active. raNITIdine HCl (300MG  Tablet, Oral) Active. Multi-Day (Oral) Active. Vitamin B12 (Oral) Specific strength unknown - Active. Medications Reconciled RaNITidine HCl (300MG  Capsule, Oral) Active. Zofran (8MG  Tablet, Oral)  Active. Vitamin C (100MG  Tablet Chewable, Oral) Active. Clobetasol Propionate (External) Specific strength unknown - Active. Spironolactone (50MG  Tablet, 1 Oral daily) Active. IUD's (Intrauterine) Specific strength unknown - Active.  Social History [name] Fuentes T. Dayani Winbush, MD; 11/26/2018 10:58 AM) Alcohol use  Occasional alcohol use. Caffeine use  Carbonated beverages, Tea. No drug use  Tobacco use  Never smoker.  Family History [name] Fuentes T. [name] Cease, MD; 11/26/2018 10:58 AM) Alcohol Abuse  Father. Depression  Father. Hypertension  Brother, Father, Mother. Ischemic Bowel Disease  Mother.  Pregnancy / Birth History [name] Fuentes T. [name] Valvano, MD; 11/26/2018 10:58 AM) Age at menarche  58 years. Contraceptive History  Intrauterine device. Gravida  0 Irregular periods  Para  0  Other Problems [name] Fuentes T. [name] Vencill, MD; 11/26/2018 10:58 AM) Gastroesophageal Reflux Disease  High blood pressure   Vitals ([name] A. Brown RMA; 11/26/2018 10:41 AM) 11/26/2018 10:41 AM Weight: 303.8 lb Height: 62.5in Body Surface Area: 2.3 m Body Mass Index: 54.68 kg/m  Temp.: 98.13F  Pulse: 104 (Regular)  BP: 128/86 (Sitting, Left Arm, Standard)       Physical Exam [name] Fuentes T. Maleigh Bagot MD; 11/26/2018 10:56 AM) The physical exam findings are as follows: Note:General: No acute distress Lungs: Clear to respirations Abdomen: Moderate epigastric and right upper quadrant tenderness. No guarding or peritoneal signs.    Assessment & Plan [name] Fuentes T. [name] Boggess MD; 11/26/2018 10:58 AM) S/P LAPAROSCOPIC SLEEVE GASTRECTOMY (Z98.84) Impression: status post laparoscopic sleeve gastrectomy on 09/28/18 by Dr. Excell Seltzer. RIGHT UPPER QUADRANT ABDOMINAL PAIN (R10.11) Impression: New-onset of postprandial right upper quadrant abdominal pain with nausea. Symptoms and physical exam were suspicious for cholecystitis. Cannot rule out complications such as leak from her sleeve but location and  character of pain does not really fit and she is quite a ways  out from her surgery. I'm going to start a workup with a gallbladder ultrasound, CBC seen at an lipase. If this is negative she will need a CT scan to evaluate her sleeve. She is given small prescriptions for oxycodone and Zofran. She is instructed to go to the emergency room for pain returns and a severe nature and unrelenting.  Addendum: Ultrasound has shown multiple small gallstones without gallbladder wall thickening.  She also has moderate elevated LFTs.  On follow-up by phone her pain has resolved.  Possible passed common bile duct stone.  I recommended proceeding with laparoscopic cholecystectomy with cholangiogram.  We discussed the indications and nature of the procedure, expected recovery, risks of anesthetic complications, bleeding, infection, visceral injury, bile leak or bile duct injury and possible need for open procedure.  As her pain is resolved I will not repeat LFTs but perform a careful intraoperative cholangiogram.  All questions answered and she agrees to proceed.

## 2018-12-01 ENCOUNTER — Ambulatory Visit (HOSPITAL_COMMUNITY): Payer: 59 | Admitting: Anesthesiology

## 2018-12-01 ENCOUNTER — Ambulatory Visit (HOSPITAL_COMMUNITY)
Admission: RE | Admit: 2018-12-01 | Discharge: 2018-12-01 | Disposition: A | Discharge status: Home / Self Care | Payer: 59 | Attending: General Surgery | Admitting: General Surgery

## 2018-12-01 ENCOUNTER — Encounter (HOSPITAL_COMMUNITY): Payer: Self-pay | Admitting: *Deleted

## 2018-12-01 ENCOUNTER — Ambulatory Visit (HOSPITAL_COMMUNITY): Payer: 59

## 2018-12-01 ENCOUNTER — Encounter (HOSPITAL_COMMUNITY)
Admission: RE | Disposition: A | Discharge status: Home / Self Care | Payer: Self-pay | Source: Home / Self Care | Attending: General Surgery

## 2018-12-01 DIAGNOSIS — K8010 Calculus of gallbladder with chronic cholecystitis without obstruction: Secondary | ICD-10-CM | POA: Insufficient documentation

## 2018-12-01 DIAGNOSIS — Z9884 Bariatric surgery status: Secondary | ICD-10-CM | POA: Insufficient documentation

## 2018-12-01 DIAGNOSIS — K8020 Calculus of gallbladder without cholecystitis without obstruction: Secondary | ICD-10-CM | POA: Diagnosis not present

## 2018-12-01 DIAGNOSIS — Z419 Encounter for procedure for purposes other than remedying health state, unspecified: Secondary | ICD-10-CM

## 2018-12-01 DIAGNOSIS — Z6841 Body Mass Index (BMI) 40.0 and over, adult: Secondary | ICD-10-CM | POA: Insufficient documentation

## 2018-12-01 DIAGNOSIS — K219 Gastro-esophageal reflux disease without esophagitis: Secondary | ICD-10-CM | POA: Diagnosis not present

## 2018-12-01 DIAGNOSIS — I10 Essential (primary) hypertension: Secondary | ICD-10-CM | POA: Insufficient documentation

## 2018-12-01 DIAGNOSIS — Z79899 Other long term (current) drug therapy: Secondary | ICD-10-CM | POA: Insufficient documentation

## 2018-12-01 DIAGNOSIS — K828 Other specified diseases of gallbladder: Secondary | ICD-10-CM | POA: Diagnosis not present

## 2018-12-01 DIAGNOSIS — R1011 Right upper quadrant pain: Secondary | ICD-10-CM | POA: Diagnosis present

## 2018-12-01 HISTORY — PX: CHOLECYSTECTOMY: SHX55

## 2018-12-01 HISTORY — DX: Personal history of urinary calculi: Z87.442

## 2018-12-01 LAB — BASIC METABOLIC PANEL
Anion gap: 9 (ref 5–15)
BUN: 5 mg/dL — ABNORMAL LOW (ref 6–20)
CO2: 21 mmol/L — ABNORMAL LOW (ref 22–32)
Calcium: 9.7 mg/dL (ref 8.9–10.3)
Chloride: 111 mmol/L (ref 98–111)
Creatinine, Ser: 0.65 mg/dL (ref 0.44–1.00)
GFR calc Af Amer: >60 mL/min (ref >60)
GFR calc non Af Amer: >60 mL/min (ref >60)
Glucose, Bld: 92 mg/dL (ref 70–99)
Potassium: 3.7 mmol/L (ref 3.5–5.1)
Sodium: 141 mmol/L (ref 135–145)

## 2018-12-01 LAB — CBC
HCT: 45 % (ref 36.0–46.0)
Hemoglobin: 14 g/dL (ref 12.0–15.0)
MCH: 29.2 pg (ref 26.0–34.0)
MCHC: 31.1 g/dL (ref 30.0–36.0)
MCV: 93.9 fL (ref 80.0–100.0)
Platelets: 222 10*3/uL (ref 150–400)
RBC: 4.79 MIL/uL (ref 3.87–5.11)
RDW: 14.5 % (ref 11.5–15.5)
WBC: 5.8 10*3/uL (ref 4.0–10.5)
nRBC: 0 % (ref 0.0–0.2)

## 2018-12-01 LAB — POCT PREGNANCY, URINE: Preg Test, Ur: NEGATIVE

## 2018-12-01 SURGERY — LAPAROSCOPIC CHOLECYSTECTOMY WITH INTRAOPERATIVE CHOLANGIOGRAM
Anesthesia: General | Site: Abdomen

## 2018-12-01 MED ORDER — FENTANYL CITRATE (PF) 100 MCG/2ML IJ SOLN
INTRAMUSCULAR | Status: AC
  Filled 2018-12-01: qty 2

## 2018-12-01 MED ORDER — SODIUM CHLORIDE 0.9 % IR SOLN
Status: DC | PRN
  Administered 2018-12-01: 1000 mL

## 2018-12-01 MED ORDER — FENTANYL CITRATE (PF) 250 MCG/5ML IJ SOLN
INTRAMUSCULAR | Status: AC
  Filled 2018-12-01: qty 5

## 2018-12-01 MED ORDER — MIDAZOLAM HCL 2 MG/2ML IJ SOLN
INTRAMUSCULAR | Status: AC
  Filled 2018-12-01: qty 2

## 2018-12-01 MED ORDER — PROPOFOL 10 MG/ML IV BOLUS
INTRAVENOUS | Status: AC
  Filled 2018-12-01: qty 20

## 2018-12-01 MED ORDER — ACETAMINOPHEN 500 MG PO TABS
1000.0000 mg | ORAL_TABLET | ORAL | Status: AC
  Administered 2018-12-01: 1000 mg via ORAL
  Filled 2018-12-01: qty 2

## 2018-12-01 MED ORDER — BUPIVACAINE-EPINEPHRINE (PF) 0.25% -1:200000 IJ SOLN
INTRAMUSCULAR | Status: AC
  Filled 2018-12-01: qty 30

## 2018-12-01 MED ORDER — SODIUM CHLORIDE 0.9 % IV SOLN
INTRAVENOUS | Status: DC | PRN
  Administered 2018-12-01: 10 mL

## 2018-12-01 MED ORDER — MIDAZOLAM HCL 5 MG/5ML IJ SOLN
INTRAMUSCULAR | Status: DC | PRN
  Administered 2018-12-01: 2 mg via INTRAVENOUS

## 2018-12-01 MED ORDER — ONDANSETRON HCL 4 MG/2ML IJ SOLN
INTRAMUSCULAR | Status: DC | PRN
  Administered 2018-12-01 (×2): 4 mg via INTRAVENOUS

## 2018-12-01 MED ORDER — PROPOFOL 10 MG/ML IV BOLUS
INTRAVENOUS | Status: DC | PRN
  Administered 2018-12-01: 200 mg via INTRAVENOUS

## 2018-12-01 MED ORDER — DEXAMETHASONE SODIUM PHOSPHATE 10 MG/ML IJ SOLN
INTRAMUSCULAR | Status: AC
  Filled 2018-12-01: qty 1

## 2018-12-01 MED ORDER — OXYCODONE HCL 5 MG PO TABS: 5.0000 mg | ORAL_TABLET | Freq: Once | ORAL | Status: DC

## 2018-12-01 MED ORDER — BUPIVACAINE-EPINEPHRINE 0.5% -1:200000 IJ SOLN
INTRAMUSCULAR | Status: DC | PRN
  Administered 2018-12-01: 20 mL

## 2018-12-01 MED ORDER — HEPARIN SODIUM (PORCINE) 5000 UNIT/ML IJ SOLN
5000.0000 [IU] | Freq: Once | INTRAMUSCULAR | Status: AC
  Administered 2018-12-01: 5000 [IU] via SUBCUTANEOUS
  Filled 2018-12-01: qty 1

## 2018-12-01 MED ORDER — ROCURONIUM BROMIDE 50 MG/5ML IV SOSY
PREFILLED_SYRINGE | INTRAVENOUS | Status: AC
  Filled 2018-12-01: qty 5

## 2018-12-01 MED ORDER — SUCCINYLCHOLINE CHLORIDE 20 MG/ML IJ SOLN
INTRAMUSCULAR | Status: DC | PRN
  Administered 2018-12-01: 80 mg via INTRAVENOUS

## 2018-12-01 MED ORDER — LACTATED RINGERS IV SOLN
INTRAVENOUS | Status: DC
  Administered 2018-12-01: 12:00:00 via INTRAVENOUS

## 2018-12-01 MED ORDER — OXYCODONE HCL 5 MG PO TABS
ORAL_TABLET | ORAL | Status: AC
  Filled 2018-12-01: qty 2

## 2018-12-01 MED ORDER — CELECOXIB 200 MG PO CAPS
200.0000 mg | ORAL_CAPSULE | ORAL | Status: AC
  Administered 2018-12-01: 200 mg via ORAL
  Filled 2018-12-01: qty 1

## 2018-12-01 MED ORDER — SCOPOLAMINE 1 MG/3DAYS TD PT72
MEDICATED_PATCH | TRANSDERMAL | Status: AC
  Filled 2018-12-01: qty 1

## 2018-12-01 MED ORDER — CHLORHEXIDINE GLUCONATE CLOTH 2 % EX PADS: 6.0000 | MEDICATED_PAD | Freq: Once | CUTANEOUS | Status: DC

## 2018-12-01 MED ORDER — DEXAMETHASONE SODIUM PHOSPHATE 10 MG/ML IJ SOLN
INTRAMUSCULAR | Status: DC | PRN
  Administered 2018-12-01: 10 mg via INTRAVENOUS

## 2018-12-01 MED ORDER — STERILE WATER FOR IRRIGATION IR SOLN
Status: DC | PRN
  Administered 2018-12-01: 1000 mL

## 2018-12-01 MED ORDER — 0.9 % SODIUM CHLORIDE (POUR BTL) OPTIME
TOPICAL | Status: DC | PRN
  Administered 2018-12-01: 1000 mL

## 2018-12-01 MED ORDER — SUCCINYLCHOLINE CHLORIDE 200 MG/10ML IV SOSY
PREFILLED_SYRINGE | INTRAVENOUS | Status: AC
  Filled 2018-12-01: qty 10

## 2018-12-01 MED ORDER — SUGAMMADEX SODIUM 200 MG/2ML IV SOLN
INTRAVENOUS | Status: DC | PRN
  Administered 2018-12-01: 400 mg via INTRAVENOUS

## 2018-12-01 MED ORDER — FENTANYL CITRATE (PF) 100 MCG/2ML IJ SOLN
INTRAMUSCULAR | Status: DC | PRN
  Administered 2018-12-01: 50 ug via INTRAVENOUS
  Administered 2018-12-01 (×2): 100 ug via INTRAVENOUS

## 2018-12-01 MED ORDER — OXYCODONE HCL 5 MG PO TABS
10.0000 mg | ORAL_TABLET | Freq: Once | ORAL | Status: AC
  Administered 2018-12-01: 10 mg via ORAL

## 2018-12-01 MED ORDER — GABAPENTIN 300 MG PO CAPS
300.0000 mg | ORAL_CAPSULE | ORAL | Status: AC
  Administered 2018-12-01: 300 mg via ORAL
  Filled 2018-12-01: qty 1

## 2018-12-01 MED ORDER — BUPIVACAINE-EPINEPHRINE 0.25% -1:200000 IJ SOLN
INTRAMUSCULAR | Status: AC
  Filled 2018-12-01: qty 1

## 2018-12-01 MED ORDER — FENTANYL CITRATE (PF) 100 MCG/2ML IJ SOLN
25.0000 ug | INTRAMUSCULAR | Status: DC | PRN
  Administered 2018-12-01 (×2): 25 ug via INTRAVENOUS

## 2018-12-01 MED ORDER — ONDANSETRON HCL 4 MG/2ML IJ SOLN
INTRAMUSCULAR | Status: AC
  Filled 2018-12-01: qty 2

## 2018-12-01 MED ORDER — SCOPOLAMINE 1 MG/3DAYS TD PT72
1.0000 | MEDICATED_PATCH | TRANSDERMAL | Status: DC
  Administered 2018-12-01: 1.5 mg via TRANSDERMAL
  Filled 2018-12-01: qty 1

## 2018-12-01 MED ORDER — LIDOCAINE 2% (20 MG/ML) 5 ML SYRINGE
INTRAMUSCULAR | Status: AC
  Filled 2018-12-01: qty 5

## 2018-12-01 MED ORDER — IOPAMIDOL (ISOVUE-300) INJECTION 61%
INTRAVENOUS | Status: AC
  Filled 2018-12-01: qty 50

## 2018-12-01 MED ORDER — FENTANYL CITRATE (PF) 100 MCG/2ML IJ SOLN
25.0000 ug | INTRAMUSCULAR | Status: DC | PRN
  Administered 2018-12-01: 25 ug via INTRAVENOUS
  Administered 2018-12-01 (×2): 50 ug via INTRAVENOUS
  Administered 2018-12-01: 25 ug via INTRAVENOUS

## 2018-12-01 MED ORDER — ROCURONIUM BROMIDE 50 MG/5ML IV SOSY
PREFILLED_SYRINGE | INTRAVENOUS | Status: DC | PRN
  Administered 2018-12-01: 20 mg via INTRAVENOUS
  Administered 2018-12-01: 30 mg via INTRAVENOUS

## 2018-12-01 MED ORDER — LIDOCAINE 2% (20 MG/ML) 5 ML SYRINGE
INTRAMUSCULAR | Status: DC | PRN
  Administered 2018-12-01: 60 mg via INTRAVENOUS

## 2018-12-01 MED ORDER — FENTANYL CITRATE (PF) 100 MCG/2ML IJ SOLN
INTRAMUSCULAR | Status: AC
  Administered 2018-12-01: 50 ug via INTRAVENOUS
  Filled 2018-12-01: qty 2

## 2018-12-01 MED FILL — oxyCODONE HCL 5 MG TABS: 5 | 3 days supply | Qty: 10 | Fill #0

## 2018-12-01 SURGICAL SUPPLY — 44 items
ADH SKN CLS APL DERMABOND .7 (GAUZE/BANDAGES/DRESSINGS) ×1
APPLIER CLIP ROT 10 11.4 M/L (STAPLE) ×2
APR CLP MED LRG 11.4X10 (STAPLE) ×1
BAG SPEC RTRVL 10 TROC 200 (ENDOMECHANICALS) ×1
BLADE CLIPPER SURG (BLADE) IMPLANT
CANISTER SUCT 3000ML PPV (MISCELLANEOUS) ×2 IMPLANT
CHLORAPREP W/TINT 26ML (MISCELLANEOUS) ×2 IMPLANT
CLIP APPLIE ROT 10 11.4 M/L (STAPLE) ×1 IMPLANT
COVER MAYO STAND STRL (DRAPES) ×2 IMPLANT
COVER SURGICAL LIGHT HANDLE (MISCELLANEOUS) ×2 IMPLANT
COVER WAND RF STERILE (DRAPES) ×1 IMPLANT
DERMABOND ADVANCED (GAUZE/BANDAGES/DRESSINGS) ×1
DERMABOND ADVANCED .7 DNX12 (GAUZE/BANDAGES/DRESSINGS) ×1 IMPLANT
DRAPE C-ARM 42X72 X-RAY (DRAPES) ×2 IMPLANT
DRAPE UTILITY XL STRL (DRAPES) ×1 IMPLANT
ELECT REM PT RETURN 9FT ADLT (ELECTROSURGICAL) ×2
ELECTRODE REM PT RTRN 9FT ADLT (ELECTROSURGICAL) ×1 IMPLANT
GLOVE BIOGEL PI IND STRL 8 (GLOVE) ×1 IMPLANT
GLOVE BIOGEL PI INDICATOR 8 (GLOVE) ×1
GLOVE ECLIPSE 7.5 STRL STRAW (GLOVE) ×2 IMPLANT
GOWN STRL REUS W/ TWL LRG LVL3 (GOWN DISPOSABLE) ×2 IMPLANT
GOWN STRL REUS W/ TWL XL LVL3 (GOWN DISPOSABLE) ×1 IMPLANT
GOWN STRL REUS W/TWL LRG LVL3 (GOWN DISPOSABLE) ×4
GOWN STRL REUS W/TWL XL LVL3 (GOWN DISPOSABLE) ×4
KIT BASIN OR (CUSTOM PROCEDURE TRAY) ×2 IMPLANT
KIT TURNOVER KIT B (KITS) ×2 IMPLANT
NS IRRIG 1000ML POUR BTL (IV SOLUTION) ×2 IMPLANT
PAD ARMBOARD 7.5X6 YLW CONV (MISCELLANEOUS) ×2 IMPLANT
POUCH RETRIEVAL ECOSAC 10 (ENDOMECHANICALS) ×1 IMPLANT
POUCH RETRIEVAL ECOSAC 10MM (ENDOMECHANICALS) ×1
SCISSORS LAP 5X35 DISP (ENDOMECHANICALS) ×2 IMPLANT
SET CHOLANGIOGRAPH 5 50 .035 (SET/KITS/TRAYS/PACK) ×2 IMPLANT
SET IRRIG TUBING LAPAROSCOPIC (IRRIGATION / IRRIGATOR) ×2 IMPLANT
SET TUBE SMOKE EVAC HIGH FLOW (TUBING) ×2 IMPLANT
SLEEVE ENDOPATH XCEL 5M (ENDOMECHANICALS) ×4 IMPLANT
SPECIMEN JAR SMALL (MISCELLANEOUS) ×2 IMPLANT
SUT MON AB 5-0 PS2 18 (SUTURE) ×2 IMPLANT
TOWEL OR 17X24 6PK STRL BLUE (TOWEL DISPOSABLE) ×2 IMPLANT
TOWEL OR 17X26 10 PK STRL BLUE (TOWEL DISPOSABLE) ×2 IMPLANT
TRAY LAPAROSCOPIC MC (CUSTOM PROCEDURE TRAY) ×2 IMPLANT
TROCAR XCEL BLUNT TIP 100MML (ENDOMECHANICALS) ×2 IMPLANT
TROCAR XCEL NON-BLD 11X100MML (ENDOMECHANICALS) ×2 IMPLANT
TROCAR XCEL NON-BLD 5MMX100MML (ENDOMECHANICALS) ×2 IMPLANT
WATER STERILE IRR 1000ML POUR (IV SOLUTION) ×2 IMPLANT

## 2018-12-01 NOTE — Transfer of Care (Signed)
Immediate Anesthesia Transfer of Care Note  Patient: [name] Fuentes  Procedure(s) Performed: LAPAROSCOPIC CHOLECYSTECTOMY WITH INTRAOPERATIVE CHOLANGIOGRAM (N/A Abdomen)  Patient Location: PACU  Anesthesia Type:General  Level of Consciousness: awake, alert  and sedated  Airway & Oxygen Therapy: Patient connected to nasal cannula oxygen  Post-op Assessment: Post -op Vital signs reviewed and stable  Post vital signs: stable  Last Vitals:  Vitals Value Taken Time  BP    Temp    Pulse    Resp    SpO2      Last Pain:  Vitals:   12/01/18 0952  TempSrc: Oral  PainSc:       Patients Stated Pain Goal: 3 (61/90/12 2241)  Complications: No apparent anesthesia complications

## 2018-12-01 NOTE — Discharge Instructions (Signed)
[facility] ______[facility] °LAPAROSCOPIC SURGERY: POST OP INSTRUCTIONS °Always review your discharge instruction sheet given to you by the facility where your surgery was performed. °IF YOU HAVE DISABILITY OR FAMILY LEAVE FORMS, YOU MUST BRING THEM TO THE OFFICE FOR PROCESSING.   °DO NOT GIVE THEM TO YOUR DOCTOR. ° °1. A prescription for pain medication may be given to you upon discharge.  Take your pain medication as prescribed, if needed.  If narcotic pain medicine is not needed, then you may take acetaminophen (Tylenol) or ibuprofen (Advil) as needed. °2. Take your usually prescribed medications unless otherwise directed. °3. If you need a refill on your pain medication, please contact your pharmacy.  They will contact our office to request authorization. Prescriptions will not be filled after 5pm or on week-ends. °4. You should follow a light diet the first few days after arrival home, such as soup and crackers, etc.  Be sure to include lots of fluids daily. °5. Most patients will experience some swelling and bruising in the area of the incisions.  Ice packs will help.  Swelling and bruising can take several days to resolve.  °6. It is common to experience some constipation if taking pain medication after surgery.  Increasing fluid intake and taking a stool softener (such as Colace) will usually help or prevent this problem from occurring.  A mild laxative (Milk of Magnesia or Miralax) should be taken according to package instructions if there are no bowel movements after 48 hours. °7. Unless discharge instructions indicate otherwise, you may remove your bandages 24-48 hours after surgery, and you may shower at that time.  You may have steri-strips (small skin tapes) in place directly over the incision.  These strips should be left on the skin for 7-10 days.  If your surgeon used skin glue on the incision, you may shower in 24 hours.  The glue will flake off over the next 2-3 weeks.  Any sutures or  staples will be removed at the office during your follow-up visit. °8. ACTIVITIES:  You may resume regular (light) daily activities beginning the next day--such as daily self-care, walking, climbing stairs--gradually increasing activities as tolerated.  You may have sexual intercourse when it is comfortable.  Refrain from any heavy lifting or straining until approved by your doctor. °a. You may drive when you are no longer taking prescription pain medication, you can comfortably wear a seatbelt, and you can safely maneuver your car and apply brakes. °b. RETURN TO WORK:  __________________________________________________________ °9. You should see your doctor in the office for a follow-up appointment approximately 2-3 weeks after your surgery.  Make sure that you call for this appointment within a day or two after you arrive home to insure a convenient appointment time. °10. OTHER INSTRUCTIONS: __________________________________________________________________________________________________________________________ __________________________________________________________________________________________________________________________ °WHEN TO CALL YOUR DOCTOR: °1. Fever over 101.0 °2. Inability to urinate °3. Continued bleeding from incision. °4. Increased pain, redness, or drainage from the incision. °5. Increasing abdominal pain ° °The clinic staff is available to answer your questions during regular business hours.  Please don’t hesitate to call and ask to speak to one of the nurses for clinical concerns.  If you have a medical emergency, go to the nearest emergency room or call 911.  A surgeon from [facility] is always on call at the hospital. °[address], [address], [place], [place]  [address] ? [address] [address], [place], [place]   [address] °[phone] ? [phone] ? FAX [phone] °Web site:   [email or web address] °

## 2018-12-01 NOTE — Interval H&P Note (Signed)
History and Physical Interval Note:  12/01/2018 9:53 AM  [name] Fuentes  has presented today for surgery, with the diagnosis of GALLSTONES  The various methods of treatment have been discussed with the patient and family. After consideration of risks, benefits and other options for treatment, the patient has consented to  Procedure(s): LAPAROSCOPIC CHOLECYSTECTOMY WITH INTRAOPERATIVE CHOLANGIOGRAM (N/A) as a surgical intervention .  The patient's history has been reviewed, patient examined, no change in status, stable for surgery.  I have reviewed the patient's chart and labs.  Questions were answered to the patient's satisfaction.     [name]

## 2018-12-01 NOTE — Anesthesia Preprocedure Evaluation (Addendum)
Anesthesia Evaluation  Patient identified by MRN, date of birth, ID band Patient awake    Reviewed: Allergy & Precautions, NPO status , Patient's Chart, lab work & pertinent test results  History of Anesthesia Complications Negative for: history of anesthetic complications  Airway Mallampati: I  TM Distance: >3 FB Neck ROM: Full    Dental no notable dental hx. (+) Teeth Intact, Dental Advisory Given   Pulmonary neg pulmonary ROS,    Pulmonary exam normal breath sounds clear to auscultation       Cardiovascular hypertension, negative cardio ROS Normal cardiovascular exam Rhythm:Regular Rate:Normal     Neuro/Psych negative neurological ROS  negative psych ROS   GI/Hepatic Neg liver ROS, GERD  Medicated and Controlled,  Endo/Other  Morbid obesityPCOS  Renal/GU negative Renal ROS  negative genitourinary   Musculoskeletal negative musculoskeletal ROS (+)   Abdominal   Peds  Hematology negative hematology ROS (+)   Anesthesia Other Findings gallstones  Reproductive/Obstetrics                           Anesthesia Physical Anesthesia Plan  ASA: III  Anesthesia Plan: General   Post-op Pain Management:    Induction: Intravenous  PONV Risk Score and Plan: 3 and Midazolam, Dexamethasone, Ondansetron and Scopolamine patch - Pre-op  Airway Management Planned: Oral ETT  Additional Equipment:   Intra-op Plan:   Post-operative Plan: Extubation in OR  Informed Consent: I have reviewed the patients History and Physical, chart, labs and discussed the procedure including the risks, benefits and alternatives for the proposed anesthesia with the patient or authorized representative who has indicated his/her understanding and acceptance.     Dental advisory given  Plan Discussed with: CRNA  Anesthesia Plan Comments:        Anesthesia Quick Evaluation

## 2018-12-01 NOTE — Anesthesia Procedure Notes (Signed)
Procedure Name: Intubation Date/Time: 12/01/2018 12:20 PM Performed by: Lavell Luster, CRNA Pre-anesthesia Checklist: Patient identified, Emergency Drugs available, Suction available, Patient being monitored and Timeout performed Patient Re-evaluated:Patient Re-evaluated prior to induction Oxygen Delivery Method: Circle system utilized Preoxygenation: Pre-oxygenation with 100% oxygen Induction Type: IV induction Ventilation: Mask ventilation without difficulty Laryngoscope Size: Miller and 2 Grade View: Grade I Tube type: Oral Tube size: 7.5 mm Number of attempts: 1 Airway Equipment and Method: Stylet Placement Confirmation: ETT inserted through vocal cords under direct vision,  positive ETCO2 and breath sounds checked- equal and bilateral Secured at: 21 cm Tube secured with: Tape Dental Injury: Teeth and Oropharynx as per pre-operative assessment  Comments: Intubation performed by Darius Bump  Dr [facility] verified placement of ETT.  Henderson Cloud, CRNA

## 2018-12-01 NOTE — Op Note (Signed)
Preoperative Diagnosis: GALLSTONES  Postoprative Diagnosis: GALLSTONES  Procedure: Procedure(s): LAPAROSCOPIC CHOLECYSTECTOMY WITH INTRAOPERATIVE CHOLANGIOGRAM   Surgeon: Excell Seltzer T   Assistants: Donnie Mesa  Anesthesia:  General endotracheal anesthesia  Indications: Patient is a 28 year old female 2 months status post laparoscopic sleeve gastrectomy with no apparent complication a good initial weight loss who presents with 3 days of intermittent severe epigastric and right flank pain.  Gallbladder ultrasound has revealed multiple small gallstones without evidence of cholecystitis.  Initial LFTs were moderately elevated, bilirubin 2.2 and transaminases 4-500.  Her pain quickly resolved.  I suspect she may have passed a common bile duct stone.  I recommend proceeding with laparoscopic cholecystectomy with cholangiogram.  The indications and nature of the procedure and risks have been discussed and detailed elsewhere and she agrees to proceed.    Procedure Detail: Patient was brought to the operating room, placed in supine position on the operating table, and general endotracheal anesthesia induced.  She received preoperative IV antibiotics and subcutaneous heparin.  PAS were in place.  The abdomen was widely sterilely prepped and draped.  Patient timeout was performed and correct procedure verified.  Access was obtained in the right upper quadrant through a previous incision with a 5 mm Optiview trocar without difficulty and pneumoperitoneum established.  Under direct vision a 5 mm trocar was placed just above and to the left of the umbilicus through previous incision and an additional 5 mm trocar placed laterally in the right upper quadrant and finally an 11 mm trocar subxiphoid.  The gallbladder was visualized and did not appear severely inflamed.  The fundus was grasped and elevated up over the liver.  There were some fatty adhesions to the infundibulum which were taken down with blunt  and cautery dissection and then the infundibulum retracted inferolaterally.  Peritoneum anterior and posterior to close triangle was incised and fibrofatty tissue stripped down off the gallbladder toward the porta hepatis.  The cystic duct and artery were skeletonized in close triangle with a good critical view.  The cystic artery was doubly clipped proximally clipped distally and divided line directly over the cystic duct.  The cystic duct was clipped of the gallbladder junction and an operative cholangiogram obtained through the cystic duct.  This showed diminutive common bile duct and intrahepatic ducts with free flow into the duodenum and no filling defects.  The Cholangiocath was removed and the cystic duct doubly clipped proximally and divided.  The gallbladder was dissected free from its bed using hook cautery.  A posterior branch of the cystic artery was controlled with clips.  The gallbladder was detached and placed in an eco-sac and brought out through the subxiphoid incision after dilating the slightly.  The right upper quadrant was thoroughly irrigated and complete hemostasis assured.  No evidence of injury or other problems.  All CO2 was evacuated and trochars removed.  Skin incisions were closed with subcuticular 5-0 Monocryl and Dermabond.  Sponge needle and instrument counts were correct.    Findings: As above  Estimated Blood Loss:  Minimal         Drains: None  Blood Given: none          Specimens: Gallbladder and contents        Complications:  * No complications entered in OR log *         Disposition: PACU - hemodynamically stable.         Condition: stable

## 2018-12-01 NOTE — Anesthesia Postprocedure Evaluation (Signed)
Anesthesia Post Note  Patient: [name] Fuentes  Procedure(s) Performed: LAPAROSCOPIC CHOLECYSTECTOMY WITH INTRAOPERATIVE CHOLANGIOGRAM (N/A Abdomen)     Patient location during evaluation: PACU Anesthesia Type: General Level of consciousness: awake and alert Pain management: pain level controlled Vital Signs Assessment: post-procedure vital signs reviewed and stable Respiratory status: spontaneous breathing, nonlabored ventilation and respiratory function stable Cardiovascular status: blood pressure returned to baseline and stable Postop Assessment: no apparent nausea or vomiting Anesthetic complications: no    Last Vitals:  Vitals:   12/01/18 1426 12/01/18 1438  BP: (!) 157/91 (!) 149/95  Pulse: 63 62  Resp: 12 18  Temp:    SpO2: 100% 100%    Last Pain:  Vitals:   12/01/18 1426  TempSrc:   PainSc: [place]

## 2018-12-02 ENCOUNTER — Encounter (HOSPITAL_COMMUNITY): Payer: Self-pay | Admitting: General Surgery

## 2019-01-18 ENCOUNTER — Encounter: Payer: Self-pay | Admitting: Physician Assistant

## 2019-01-18 ENCOUNTER — Telehealth: Payer: 59 | Admitting: Physician Assistant

## 2019-01-18 DIAGNOSIS — N390 Urinary tract infection, site not specified: Secondary | ICD-10-CM | POA: Diagnosis not present

## 2019-01-18 MED ORDER — NITROFURANTOIN MONOHYD MACRO 100 MG PO CAPS: 100.0000 mg | ORAL_CAPSULE | Freq: Two times a day (BID) | ORAL | 0 refills | Status: DC

## 2019-01-18 NOTE — Progress Notes (Signed)
We are sorry that you are not feeling well.  Here is how we plan to help!  Based on what you shared with me it looks like you most likely have a simple urinary tract infection.  A UTI (Urinary Tract Infection) is a bacterial infection of the bladder.  Most cases of urinary tract infections are simple to treat but a key part of your care is to encourage you to drink plenty of fluids and watch your symptoms carefully.  I have prescribed MacroBid 100 mg twice a day for 5 days.  Your symptoms should gradually improve. Call us if the burning in your urine worsens, you develop worsening fever, back pain or pelvic pain or if your symptoms do not resolve after completing the antibiotic.   Ms.Frieling,   Review of your records shows you may have had surgery or procedure in the hospital in the past 2 months. If during this event, you had a urinary catheter placed, this increases your risk of getting a special UTI that must be treated differently. If that is the case, please follow up with your doctor for a urine culture before starting this antibiotic I prescribed for you.    Urinary tract infections can be prevented by drinking plenty of water to keep your body hydrated.  Also be sure when you wipe, wipe from front to back and don't hold it in!  If possible, empty your bladder every 4 hours.  Your e-visit answers were reviewed by a board certified advanced clinical practitioner to complete your personal care plan.  Depending on the condition, your plan could have included both over the counter or prescription medications.  If there is a problem please reply  once you have received a response from your provider.  Your safety is important to [place].  If you have drug allergies check your prescription carefully.    You can use MyChart to ask questions about today's visit, request a non-urgent call back, or ask for a work or school excuse for 24 hours related to this e-Visit. If it has been greater than 24  hours you will need to follow up with your provider, or enter a new e-Visit to address those concerns.   You will get an e-mail in the next two days asking about your experience.  I hope that your e-visit has been valuable and will speed your recovery. Thank you for using e-visits.   I have spent 7 min min in completion of this note- SO

## 2019-02-25 ENCOUNTER — Telehealth: Payer: Self-pay | Admitting: Allergy & Immunology

## 2019-02-25 DIAGNOSIS — Z20822 Contact with and (suspected) exposure to covid-19: Secondary | ICD-10-CM

## 2019-02-25 DIAGNOSIS — Z20828 Contact with and (suspected) exposure to other viral communicable diseases: Secondary | ICD-10-CM

## 2019-02-25 NOTE — Telephone Encounter (Signed)
Patient was exposed to known COVID case. Swab ordered.   Salvatore Marvel, MD [facility]

## 2019-02-26 LAB — NOVEL CORONAVIRUS, NAA: SARS-CoV-2, NAA: NOT DETECTED

## 2019-03-01 ENCOUNTER — Encounter: Payer: Self-pay | Admitting: Physician Assistant

## 2019-03-16 ENCOUNTER — Ambulatory Visit: Payer: Self-pay

## 2019-03-18 ENCOUNTER — Encounter: Payer: 59 | Attending: General Surgery | Admitting: Skilled Nursing Facility1

## 2019-03-18 ENCOUNTER — Other Ambulatory Visit: Payer: Self-pay

## 2019-03-18 DIAGNOSIS — E669 Obesity, unspecified: Secondary | ICD-10-CM | POA: Insufficient documentation

## 2019-03-18 NOTE — Patient Instructions (Addendum)
-  In the frozen food aisle try soy crumbles   -For protein use seafood and plant based such as beans, lentils, and soy  -Aim to be active for 30 minutes 5 days a week: Swimming!  -Try cutting down to 1 snack and drinking instead   -Try making sugar free slushees for the day  -Eat until satisfaction not necessarily full

## 2019-03-18 NOTE — Progress Notes (Signed)
This visit was completed via telephone due to the COVID-19 pandemic.   I spoke with pt and verified that I was speaking with the correct person with two patient identifiers (full name and date of birth).   I discussed the limitations related to this kind of visit and the patient is willing to proceed.   Bariatric Follow-Up Visit  Post-Operative Sleeve Gastrectomy Surgery Medical Nutrition Therapy  Appt time: 5:15-  Pt states she had gallstone after that having had to have a cholecystectomy feeling much better now: now nausea and pain. Pt states she feels hunger every 2 hours so she drinks instead. Pt states she has never tried fish before. Pt state she has tried breaded white fish more recently. Pt states she is experiencing a plateau. Pt states she has been feeling good but a little stressed due to [place]. Pt states she weighs every day. Pt states he has lost 76 pounds. Pt states she no longer snores at night. Pt states she does not drink because she just forgets. Pt states she is trying not to eat just vegetables anymore. Pt states she was advice not to take stool softeners due to kidney stones. Pt states she never had a menstrual cycle and now it is normalized with heavy bleeding with intense cramps. Pt states she has a IUD. Pt was educated on waiting to get pregnant.  Pt states the bari multi makes her nauseous. Pt states when she is hunger she has stomach pains. Pt states she eats until she feels it.    NUTRITION ASSESSMENT  Anthropometrics  Start weight at [facility]: 348.8 lbs (Date: 08/05/2018) Today's weight: telephone visit  TANITA  Body Composition Results  10/13/2018 11/19/2018   BMI (kg/m2) 57.6 55.4   Fat Mass (lbs) 193.4 166.4   Fat Free Mass (lbs) 126.8 141.2   Total Body Water (lbs) - 106   Total Body Water (%) - 34.6   Clinical Medications: see list Supplements: Flintstone's, biotin, thiamin, calcium citrate, B12, iron   24-Hr Dietary Recall First Meal 7:30: squeezable  applesauce unsweetened and cheese stick and ornage or half greek yogurt + cheese stick  Snack10: snacking on the breakfast items  Second Meal 12:30: cheese stick with lunch meat and pickle Snack 3: protein chips or protein bar or pickle or fruit  And then nuts  Third Meal: steak and broccoli or chicken and peas or chicken and carrots  Snack: carrots with ranch or sugar free pudding or slim jim Beverages: water + lemon water + lime water + lemonade + powerade zero  Estimated Daily Fluid Intake: 40 oz Estimated Daily Protein Intake: 60+ g  Physical Activity  Current average weekly physical activity:  ADL's  Post-Op Goals Drinking while eating: yes Chewing/swallowing difficulties: no Changes in vision: no Changes to mood/headaches: no Hair loss/changes to skin/nails: yes  Difficulty focusing/concentrating: no Sweating: no Dizziness/lightheadedness: no Palpitations: no  Carbonated/caffeinated beverages: no N/V/D/C/Gas: hard stools Abdominal pain: no Dumping syndrome: no  NUTRITION DIAGNOSIS  Overweight/obesity ([place]-3.3) related to past poor dietary habits and physical inactivity as evidenced by patient w/ completed Sleeve Gastrectomy surgery following dietary guidelines for continued weight loss.   NUTRITION INTERVENTION Nutrition counseling (C-1) and education (E-2) to facilitate bariatric surgery goals, including: Goals: -In the frozen food aisle try soy crumbles   -For protein use seafood and plant based such as beans, lentils, and soy  -Aim to be active for 30 minutes 5 days a week: Swimming!  -Try cutting down to 1 snack and  drinking instead   -Try making sugar free slushees for the day  -Eat until satisfaction not necessarily full  Handouts Provided Include   apps  Vegetarian options  Snack sheet  Exercise   Next phase   Learning Style & Readiness for Change Teaching method utilized: Visual & Auditory  Demonstrated degree of understanding via: Teach  Back  Barriers to learning/adherence to lifestyle change: None Identified   MONITORING & EVALUATION Dietary intake, weekly physical activity, body weight, and labs to ensure pt's current supplement choices are meeting nutritional needs.  Next Steps Patient is to follow-up in 6 weeks for 6 month post-op class. Patient is to call [facility] between now and next follow-up appointment as needed with questions or concerns.

## 2019-03-23 ENCOUNTER — Encounter: Payer: Self-pay | Admitting: Family Medicine

## 2019-03-23 ENCOUNTER — Other Ambulatory Visit: Payer: Self-pay

## 2019-03-23 ENCOUNTER — Ambulatory Visit (INDEPENDENT_AMBULATORY_CARE_PROVIDER_SITE_OTHER): Payer: 59 | Admitting: Family Medicine

## 2019-03-23 VITALS — BP 134/80 | HR 84 | Wt 285.0 lb

## 2019-03-23 DIAGNOSIS — S90222A Contusion of left lesser toe(s) with damage to nail, initial encounter: Secondary | ICD-10-CM | POA: Diagnosis not present

## 2019-03-23 DIAGNOSIS — L600 Ingrowing nail: Secondary | ICD-10-CM

## 2019-03-23 MED ORDER — DOXYCYCLINE HYCLATE 100 MG PO TABS: 100.0000 mg | ORAL_TABLET | Freq: Two times a day (BID) | ORAL | 0 refills | Status: DC

## 2019-03-23 NOTE — Progress Notes (Signed)
[name] Fuentes is a 28 y.o. female who presents to [facility] today for left toe injury.  About a month ago [place] her left first toe.  She developed a black toenail and had some pain.  This resolved pretty quickly.  About a week ago she developed an ingrown toenail on the lateral aspect of the left great toenail.  She trimmed it back and has been doing Epson salt soaks.  She was able to express some pus and the redness is going down however it still a bit painful.  She is worried the toenail needs to be removed and is here for evaluation and treatment.  She denies fevers chills nausea vomiting or diarrhea.   ROS:  As above  Exam:  BP 134/80   Pulse 84   Wt 285 lb (129.3 kg)   SpO2 100%   BMI 52.13 kg/m  Wt Readings from Last 5 Encounters:  03/23/19 285 lb (129.3 kg)  12/01/18 299 lb (135.6 kg)  11/19/18 (!) 307 lb 9.6 oz (139.5 kg)  10/29/18 (!) 316 lb 1.6 oz (143.4 kg)  10/14/18 (!) 320 lb (145.2 kg)   General: Well Developed, well nourished, and in no acute distress.  Neuro/Psych: Alert and oriented x3, extra-ocular muscles intact, able to move all 4 extremities, sensation grossly intact. Skin: Warm and dry, no rashes noted.  Respiratory: Not using accessory muscles, speaking in full sentences, trachea midline.  Cardiovascular: Pulses palpable, no extremity edema. Abdomen: Does not appear distended. MSK:  Left great toe: Old subungual hematoma present.  Slight erythema at the lateral nail border.  Mildly tender.  The toenail itself is not loose and cannot move easily.  Not particularly tender.    Lab and Radiology Results No results found for this or any previous visit (from the past 72 hour(s)). No results found.     Assessment and Plan: 28 y.o. female with ingrown toenail: Improving with conservative management.  I am hopeful that with a course of antibiotics will be able to settle this down and not need to  proceed to nail removal.  The nail does not appear to be ready for removal today and I am worried that I well damage the nail matrix by forcing a removal early.  Plan for watchful waiting and recheck as needed.  Precautions reviewed.   PDMP not reviewed this encounter. No orders of the defined types were placed in this encounter.  No orders of the defined types were placed in this encounter.   Historical information moved to improve visibility of documentation.  Past Medical History:  Diagnosis Date  . Dyspnea    on exertion   . GERD (gastroesophageal reflux disease)   . History of kidney stones 10/28/2019   passed   . Hydradenitis   . Hypertension    no longer on medication  . Obese   . PCOS (polycystic ovarian syndrome)   . Psoriasis    Past Surgical History:  Procedure Laterality Date  . ANKLE RECONSTRUCTION Left    L ankle; plates and screws in place   . CHOLECYSTECTOMY N/A 12/01/2018   Procedure: LAPAROSCOPIC CHOLECYSTECTOMY WITH INTRAOPERATIVE CHOLANGIOGRAM;  Surgeon: Excell Seltzer, MD;  Location: [facility];  Service: General;  Laterality: N/A;  . LAPAROSCOPIC GASTRIC SLEEVE RESECTION N/A 09/28/2018   Procedure: LAPAROSCOPIC GASTRIC SLEEVE RESECTION, UPPER ENDO, ERAS PATHWAY;  Surgeon: Excell Seltzer, MD;  Location: [facility];  Service: General;  Laterality: N/A;  . [place] Plasty    .  TONSILLECTOMY    . TYMPANOSTOMY TUBE PLACEMENT     several sets  . WISDOM TOOTH EXTRACTION     Social History   Tobacco Use  . Smoking status: Never Smoker  . Smokeless tobacco: Never Used  Substance Use Topics  . Alcohol use: No   family history includes Cancer in her paternal grandfather; Hypertension in her brother, father, maternal grandfather, and mother; Stroke in her paternal grandfather.  Medications: Current Outpatient Medications  Medication Sig Dispense Refill  . CALCIUM CITRATE PO Take 1 tablet by mouth 2 (two) times daily.    . clobetasol cream (TEMOVATE) 8.29 %  Apply 1 application topically 2 (two) times daily. (Patient taking differently: Apply 1 application topically 2 (two) times daily as needed (psoriasis). ) 60 g 2  . Levonorgestrel (KYLEENA) 19.5 MG IUD 1 each by Intrauterine route once.    . Pediatric Multivit-Minerals-C (FLINTSTONES COMPLETE PO) Take 1 tablet by mouth 2 (two) times daily.    . vitamin B-12 (CYANOCOBALAMIN) 500 MCG tablet Take 500 mcg by mouth 2 (two) times daily.     No current facility-administered medications for this visit.    Allergies  Allergen Reactions  . Metformin And Related Diarrhea      Discussed warning signs or symptoms. Please see discharge instructions. Patient expresses understanding.

## 2019-03-23 NOTE — Patient Instructions (Signed)
Thank you for coming in today. Take doxycycline twice daily for 1 week.  If the toe is worsening or not improving we can proceed with removal.  Return sooner if needed.    Ingrown Toenail An ingrown toenail occurs when the corner or sides of a toenail grow into the surrounding skin. This causes discomfort and pain. The big toe is most commonly affected, but any of the toes can be affected. If an ingrown toenail is not treated, it can become infected. What are the causes? This condition may be caused by:  Wearing shoes that are too small or tight.  An injury, such as stubbing your toe or having your toe stepped on.  Improper cutting or care of your toenails.  Having nail or foot abnormalities that were present from birth (congenital abnormalities), such as having a nail that is too big for your toe. What increases the risk? The following factors may make you more likely to develop ingrown toenails:  Age. Nails tend to get thicker with age, so ingrown nails are more common among older people.  Cutting your toenails incorrectly, such as cutting them very short or cutting them unevenly. An ingrown toenail is more likely to get infected if you have:  Diabetes.  Blood flow (circulation) problems. What are the signs or symptoms? Symptoms of an ingrown toenail may include:  Pain, soreness, or tenderness.  Redness.  Swelling.  Hardening of the skin that surrounds the toenail. Signs that an ingrown toenail may be infected include:  Fluid or pus.  Symptoms that get worse instead of better. How is this diagnosed? An ingrown toenail may be diagnosed based on your medical history, your symptoms, and a physical exam. If you have fluid or blood coming from your toenail, a sample may be collected to test for the specific type of bacteria that is causing the infection. How is this treated? Treatment depends on how severe your ingrown toenail is. You may be able to care for your toenail  at home.  If you have an infection, you may be prescribed antibiotic medicines.  If you have fluid or pus draining from your toenail, your health care provider may drain it.  If you have trouble walking, you may be given crutches to use.  If you have a severe or infected ingrown toenail, you may need a procedure to remove part or all of the nail. Follow these instructions at home: Foot care   Do not pick at your toenail or try to remove it yourself.  Soak your foot in warm, soapy water. Do this for 20 minutes, 3 times a day, or as often as told by your health care provider. This helps to keep your toe clean and keep your skin soft.  Wear shoes that fit well and are not too tight. Your health care provider may recommend that you wear open-toed shoes while you heal.  Trim your toenails regularly and carefully. Cut your toenails straight across to prevent injury to the skin at the corners of the toenail. Do not cut your nails in a curved shape.  Keep your feet clean and dry to help prevent infection. Medicines  Take over-the-counter and prescription medicines only as told by your health care provider.  If you were prescribed an antibiotic, take it as told by your health care provider. Do not stop taking the antibiotic even if you start to feel better. Activity  Return to your normal activities as told by your health care provider. Ask your  health care provider what activities are safe for you.  Avoid activities that cause pain. General instructions  If your health care provider told you to use crutches to help you move around, use them as instructed.  Keep all follow-up visits as told by your health care provider. This is important. Contact a health care provider if:  You have more redness, swelling, pain, or other symptoms that do not improve with treatment.  You have fluid, blood, or pus coming from your toenail. Get help right away if:  You have a red streak on your skin  that starts at your foot and spreads up your leg.  You have a fever. Summary  An ingrown toenail occurs when the corner or sides of a toenail grow into the surrounding skin. This causes discomfort and pain. The big toe is most commonly affected, but any of the toes can be affected.  If an ingrown toenail is not treated, it can become infected.  Fluid or pus draining from your toenail is a sign of infection. Your health care provider may need to drain it. You may be given antibiotics to treat the infection.  Trimming your toenails regularly and properly can help you prevent an ingrown toenail. This information is not intended to replace advice given to you by your health care provider. Make sure you discuss any questions you have with your health care provider. Document Released: 10/18/2000 Document Revised: 07/09/2017 Document Reviewed: 07/09/2017 Elsevier Interactive Patient Education  2019 [employer] Inc[employer]   Subungual Hematoma A subungual hematoma, sometimes called runner's toe or tennis toe, is a collection of blood under a fingernail or toenail. It can cause pain and a dark blue area under the nail. What are the causes? This condition is caused by an injury to a finger or toe that breaks a blood vessel beneath the nail. It can develop from:  A hard, direct hit to a finger or toe (crush injury).  Pressure being repeatedly put on a finger or toe, such as pressure on a toe from running or playing tennis. What are the signs or symptoms? Symptoms of this condition include:  A blue or dark blue color under the nail.  Pain or throbbing in the injured area. How is this diagnosed? This condition is diagnosed with a medical history and a physical exam. X-rays may be done to check for damage to the surrounding bones and tissues. How is this treated? Usually, treatment is not needed for this condition. The pain often goes away in a few days, and the dark color under the nail will go away as  the nail grows. If the injury is more severe, your health care provider may:  Perform a painless procedure to drain the blood from beneath the nail. This may be done if the condition is causing a lot of pain or if a lot of blood collects under the fingernail or toenail.  Remove the nail. This may be needed if there is a cut under the nail that requires stitches (sutures). Follow these instructions at home: Managing pain, stiffness, and swelling   If directed, apply ice to the injured area: ? Put ice in a plastic bag. ? Place a towel between your skin and the bag. ? Leave the ice on for 20 minutes, 2-3 times a day.  Raise (elevate) the injured finger or toe above the level of your heart while you are sitting or lying down. This will help to decrease pain and swelling. Injury care  Follow  instructions from your health care provider about how to take care of your injury. Make sure you: ? Change any bandage (dressing) as told by your health care provider. ? Wash your hands with soap and water before you change your dressing. If soap and water are not available, use hand sanitizer. ? Leave stitches (sutures) in place. You may have these if your health care provider repaired a cut under the nail. The sutures may need to stay in place for 2 weeks or longer.  If part of your nail falls off, gently trim the remaining nail. This prevents the remaining nail from catching on something and causing further injury. General instructions  Take over-the-counter and prescription medicines only as told by your health care provider.  Return to your normal activities as told by your health care provider. Ask your health care provider what activities are safe for you.  Keep all follow-up visits as told by your health care provider. This is important. Contact a health care provider if you have:  Pain that is not controlled with medicine.  A fever.  Redness, swelling, or pain around your nail. Get help  right away if you have:  Fluid, blood, or pus coming from your nail. Summary  A subungual hematoma is a collection of blood under a fingernail or toenail.  This condition is typically caused by a crush injury or an injury from repeatedly putting stress on a finger or toe.  The condition causes pain and a dark blue area under the nail.  A subungual hematoma will usually go away on its own.  In some cases, a health care provider may drain the blood from underneath the nail. This information is not intended to replace advice given to you by your health care provider. Make sure you discuss any questions you have with your health care provider. Document Released: 10/18/2000 Document Revised: 03/26/2018 Document Reviewed: 03/26/2018 Elsevier Interactive Patient Education  2019 [employer].

## 2019-04-27 ENCOUNTER — Encounter: Payer: Self-pay | Admitting: Physician Assistant

## 2019-04-27 NOTE — Telephone Encounter (Signed)
Patient needs appt. Thanks!

## 2019-04-28 ENCOUNTER — Telehealth (INDEPENDENT_AMBULATORY_CARE_PROVIDER_SITE_OTHER): Payer: 59 | Admitting: Physician Assistant

## 2019-04-28 VITALS — BP 130/84 | HR 81 | Ht 62.0 in | Wt 276.0 lb

## 2019-04-28 DIAGNOSIS — K219 Gastro-esophageal reflux disease without esophagitis: Secondary | ICD-10-CM | POA: Diagnosis not present

## 2019-04-28 DIAGNOSIS — Z0000 Encounter for general adult medical examination without abnormal findings: Secondary | ICD-10-CM

## 2019-04-28 DIAGNOSIS — Z9884 Bariatric surgery status: Secondary | ICD-10-CM

## 2019-04-28 MED ORDER — FAMOTIDINE 10 MG PO TABS: 10.0000 mg | ORAL_TABLET | ORAL | 5 refills | Status: DC | PRN

## 2019-05-03 NOTE — Progress Notes (Signed)
Patient ID: [name] Fuentes, female   DOB: 10/08/1991, 28 y.o.   MRN: [id number] .Marland KitchenVirtual Visit via Video Note  I connected with [name] Fuentes on 05/03/19 at  1:00 PM EDT by a video enabled telemedicine application and verified that I am speaking with the correct person using two identifiers.  Location: Patient: work Provider: clinic   I discussed the limitations of evaluation and management by telemedicine and the availability of in person appointments. The patient expressed understanding and agreed to proceed.  History of Present Illness: Pt is a 28 yo female s/p gastric sleeve who presents to the clinic to have forms filled out for foster care placement. Pt is doing well. She continues to lose weight. She has 2 foster placements and doing well. No problems or concerns.   .. Active Ambulatory Problems    Diagnosis Date Noted  . Psoriasis 03/12/2017  . PCOS (polycystic ovarian syndrome) 03/12/2017  . Hypertension goal BP (blood pressure) < 130/80 03/12/2017  . Elevated blood pressure reading 03/12/2017  . Morbid obesity ([place]) 03/12/2017  . Gastroesophageal reflux disease without esophagitis 03/12/2017  . Elevated TSH 04/27/2018  . Body aches 04/27/2018  . Elevated ALT measurement 04/28/2018  . 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 Otalgia, right 10/18/2018   Resolved Ambulatory Problems    Diagnosis Date Noted  . Morbid obesity with BMI of 60.0-69.9, adult ([place]) 09/28/2018   Past Medical History:  Diagnosis Date  . Dyspnea   . GERD (gastroesophageal reflux disease)   . History of kidney stones 10/28/2019  . Hydradenitis   . Hypertension   . Obese        Observations/Objective: No acute distress. Normal mood.  Normal breathing.  Normal appearance.   .. Today's Vitals   04/28/19 1101  BP: 130/84  Pulse: 81  Weight: 276 lb (125.2 kg)  Height: 5\' 2"  (1.575 m)   Body mass index is 50.48  kg/m.  .. Depression screen [facility] 2/9 05/03/2019 10/14/2018 03/12/2017  Decreased Interest 0 0 0  Down, Depressed, Hopeless 0 0 0  PHQ - 2 Score 0 0 0     Assessment and Plan: Marland KitchenMarland KitchenMadison was seen today for follow-up.  Diagnoses and all orders for this visit:  Preventative health care  Status post laparoscopic sleeve gastrectomy  Morbid obesity ([place])  Gastroesophageal reflux disease without esophagitis -     famotidine (PEPCID) 10 MG tablet; Take 1 tablet (10 mg total) by mouth as needed for heartburn or indigestion.   Pt will get labs with bariatric surgeon.  BMI 50. Trending down.  Paperwork filled out for foster care. No restrictions.   Follow Up Instructions:    I discussed the assessment and treatment plan with the patient. The patient was provided an opportunity to ask questions and all were answered. The patient agreed with the plan and demonstrated an understanding of the instructions.   The patient was advised to call back or seek an in-person evaluation if the symptoms worsen or if the condition fails to improve as anticipated.    Iran Planas, PA-C

## 2019-05-14 DIAGNOSIS — D2239 Melanocytic nevi of other parts of face: Secondary | ICD-10-CM | POA: Diagnosis not present

## 2019-05-14 DIAGNOSIS — B081 Molluscum contagiosum: Secondary | ICD-10-CM | POA: Diagnosis not present

## 2019-05-14 DIAGNOSIS — L700 Acne vulgaris: Secondary | ICD-10-CM | POA: Diagnosis not present

## 2019-07-13 ENCOUNTER — Telehealth: Payer: Self-pay | Admitting: Allergy & Immunology

## 2019-07-13 DIAGNOSIS — J3489 Other specified disorders of nose and nasal sinuses: Secondary | ICD-10-CM

## 2019-07-13 NOTE — Telephone Encounter (Signed)
Patient needs a COVID19 screen to come back to work. Lab ordered.  Salvatore Marvel, MD [facility]

## 2019-07-15 LAB — NOVEL CORONAVIRUS, NAA: SARS-CoV-2, NAA: NOT DETECTED

## 2019-08-06 ENCOUNTER — Telehealth: Payer: BC Managed Care – PPO | Admitting: Physician Assistant

## 2019-08-06 DIAGNOSIS — N3000 Acute cystitis without hematuria: Secondary | ICD-10-CM

## 2019-08-06 MED ORDER — CEPHALEXIN 500 MG PO CAPS: ORAL_CAPSULE | ORAL | 0 refills | Status: DC

## 2019-08-06 NOTE — Progress Notes (Signed)

## 2019-09-10 ENCOUNTER — Encounter: Payer: Self-pay | Admitting: Physician Assistant

## 2019-09-17 ENCOUNTER — Telehealth (INDEPENDENT_AMBULATORY_CARE_PROVIDER_SITE_OTHER): Payer: BC Managed Care – PPO | Admitting: Physician Assistant

## 2019-09-17 ENCOUNTER — Encounter: Payer: Self-pay | Admitting: Physician Assistant

## 2019-09-17 VITALS — BP 120/84 | HR 82 | Temp 97.4°F | Wt 272.3 lb

## 2019-09-17 DIAGNOSIS — F410 Panic disorder [episodic paroxysmal anxiety] without agoraphobia: Secondary | ICD-10-CM

## 2019-09-17 DIAGNOSIS — F419 Anxiety disorder, unspecified: Secondary | ICD-10-CM | POA: Diagnosis not present

## 2019-09-17 MED ORDER — HYDROXYZINE HCL 25 MG PO TABS: 25.0000 mg | ORAL_TABLET | Freq: Three times a day (TID) | ORAL | 0 refills | Status: DC | PRN

## 2019-09-17 MED ORDER — ESCITALOPRAM OXALATE 5 MG PO TABS: 5.0000 mg | ORAL_TABLET | Freq: Every day | ORAL | 1 refills | Status: DC

## 2019-09-17 NOTE — Progress Notes (Signed)
Patient ID: [name] Fuentes, female   DOB: 1991-07-31, 28 y.o.   MRN: [id number] .Virtual Visit via Video Note  I connected with [name] Fuentes on 09/20/19 at  3:00 PM EST by a video enabled telemedicine application and verified that I am speaking with the correct person using two identifiers.  Location: Patient: home Provider: clinic   I discussed the limitations of evaluation and management by telemedicine and the availability of in person appointments. The patient expressed understanding and agreed to proceed.  History of Present Illness: Pt is a 28 yo female who calls into the clinic to discuss anxiety. Over the last month or so her anxiety has been really bad. Denies any depression. She does have 4 children and working full time. Her mother, father, sisters all have anxiety. She reports panic attack symptoms that have happened 2 times of the past month. She is not on any medication. She does admit to starting Accutane back in July. She had to decrease accutane once due to GI side effects.   .. Active Ambulatory Problems    Diagnosis Date Noted  . Psoriasis 03/12/2017  . PCOS (polycystic ovarian syndrome) 03/12/2017  . Hypertension goal BP (blood pressure) < 130/80 03/12/2017  . Elevated blood pressure reading 03/12/2017  . Morbid obesity ([place]) 03/12/2017  . Gastroesophageal reflux disease without esophagitis 03/12/2017  . Elevated TSH 04/27/2018  . Body aches 04/27/2018  . Elevated ALT measurement 04/28/2018  . 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 Otalgia, right 10/18/2018  . Anxiety 09/20/2019  . Panic attacks 09/20/2019   Resolved Ambulatory Problems    Diagnosis Date Noted  . Morbid obesity with BMI of 60.0-69.9, adult ([place]) 09/28/2018   Past Medical History:  Diagnosis Date  . Dyspnea   . GERD (gastroesophageal reflux disease)   . History of kidney stones 10/28/2019  . Hydradenitis   .  Hypertension   . Obese    Reviewed med, allergy, problem list.     Observations/Objective: No acute distress.  Normal mood and appearance.   .. Today's Vitals   09/17/19 1505  BP: 120/84  Pulse: 82  Temp: (!) 97.4 F (36.3 C)  TempSrc: Oral  Weight: 272 lb 4.8 oz (123.5 kg)   Body mass index is 49.8 kg/m.  .. Depression screen [facility] 2/9 09/17/2019 05/03/2019 10/14/2018 03/12/2017  Decreased Interest 0 0 0 0  Down, Depressed, Hopeless 0 0 0 0  PHQ - 2 Score 0 0 0 0  Altered sleeping 1 - - -  Tired, decreased energy 0 - - -  Change in appetite 0 - - -  Feeling bad or failure about yourself  0 - - -  Trouble concentrating 0 - - -  Moving slowly or fidgety/restless 0 - - -  Suicidal thoughts 0 - - -  PHQ-9 Score 1 - - -  Difficult doing work/chores Not difficult at all - - -   .Marland Kitchen GAD 7 : Generalized Anxiety Score 09/17/2019  Nervous, Anxious, on Edge 3  Control/stop worrying 3  Worry too much - different things 3  Trouble relaxing 3  Restless 3  Easily annoyed or irritable 3  Afraid - awful might happen 3  Total GAD 7 Score 21  Anxiety Difficulty Somewhat difficult       Assessment and Plan: Marland KitchenMarland KitchenDiagnoses and all orders for this visit:  Anxiety -     escitalopram (LEXAPRO) 5 MG tablet; Take 1 tablet (5 mg  total) by mouth daily. -     hydrOXYzine (ATARAX/VISTARIL) 25 MG tablet; Take 1 tablet (25 mg total) by mouth every 8 (eight) hours as needed.  Panic attacks   Concerned about Accutane causing this. She has already decreased dose once before due to GI side effects. Discuss at next visit with dermatology about decreasing even more. She does have a very strong hx of anxiety in her family and she has 4 children right now. Discussed stress management. Start lexapro and vistaril as needed. Follow up in 6 weeks.    Follow Up Instructions:    I discussed the assessment and treatment plan with the patient. The patient was provided an opportunity to ask questions  and all were answered. The patient agreed with the plan and demonstrated an understanding of the instructions.   The patient was advised to call back or seek an in-person evaluation if the symptoms worsen or if the condition fails to improve as anticipated.    Iran Planas, PA-C

## 2019-09-19 ENCOUNTER — Telehealth: Payer: BC Managed Care – PPO | Admitting: Physician Assistant

## 2019-09-19 DIAGNOSIS — L03119 Cellulitis of unspecified part of limb: Secondary | ICD-10-CM | POA: Diagnosis not present

## 2019-09-19 MED ORDER — SULFAMETHOXAZOLE-TRIMETHOPRIM 800-160 MG PO TABS: 1.0000 | ORAL_TABLET | Freq: Two times a day (BID) | ORAL | 0 refills | Status: DC

## 2019-09-19 NOTE — Progress Notes (Signed)
E Visit for Cellulitis ° °We are sorry that you are not feeling well. Here is how we plan to help! ° °Based on what you shared with me it looks like you have cellulitis.  Cellulitis looks like areas of skin redness, swelling, and warmth; it develops as a result of bacteria entering under the skin. Little red spots and/or bleeding can be seen in skin, and tiny surface sacs containing fluid can occur. Fever can be present. Cellulitis is almost always on one side of a body, and the lower limbs are the most common site of involvement.  ° °I have prescribed:  Bactrim DS 1 tablet by mouth twice a day for 7 days ° °HOME CARE: ° °Take your medications as ordered and take all of them, even if the skin irritation appears to be healing.  ° °GET HELP RIGHT AWAY IF: ° °Symptoms that don't begin to go away within 48 hours. °Severe redness persists or worsens °If the area turns color, spreads or swells. °If it blisters and opens, develops yellow-brown crust or bleeds. °You develop a fever or chills. °If the pain increases or becomes unbearable.  °Are unable to keep fluids and food down. ° °MAKE SURE YOU  ° °Understand these instructions. °Will watch your condition. °Will get help right away if you are not doing well or get worse. ° °Thank you for choosing an e-visit. ° °Your e-visit answers were reviewed by a board certified advanced clinical practitioner to complete your personal care plan. Depending upon the condition, your plan could have included both over the counter or prescription medications. ° °Please review your pharmacy choice. Make sure the pharmacy is open so you can pick up prescription now. If there is a problem, you may contact your provider through MyChart messaging and have the prescription routed to another pharmacy.  Your safety is important to us. If you have drug allergies check your prescription carefully.  ° °For the next 24 hours you can use MyChart to ask questions about today's visit, request a  non-urgent call back, or ask for a work or school excuse. °You will get an email in the next two days asking about your experience. I hope that your e-visit has been valuable and will speed your recovery. ° °

## 2019-09-19 NOTE — Progress Notes (Signed)
I have spent 5 minutes in review of e-visit questionnaire, review and updating patient chart, medical decision making and response to patient.   Jayleon Mcfarlane Cody Jabar Krysiak, PA-C    

## 2019-09-20 ENCOUNTER — Encounter: Payer: Self-pay | Admitting: Physician Assistant

## 2019-09-20 DIAGNOSIS — F410 Panic disorder [episodic paroxysmal anxiety] without agoraphobia: Secondary | ICD-10-CM | POA: Insufficient documentation

## 2019-09-20 DIAGNOSIS — F419 Anxiety disorder, unspecified: Secondary | ICD-10-CM | POA: Insufficient documentation

## 2019-09-21 ENCOUNTER — Telehealth: Payer: Self-pay | Admitting: Allergy & Immunology

## 2019-09-21 DIAGNOSIS — J3489 Other specified disorders of nose and nasal sinuses: Secondary | ICD-10-CM

## 2019-09-21 DIAGNOSIS — Z20822 Contact with and (suspected) exposure to covid-19: Secondary | ICD-10-CM

## 2019-09-21 DIAGNOSIS — Z20828 Contact with and (suspected) exposure to other viral communicable diseases: Secondary | ICD-10-CM

## 2019-09-21 NOTE — Telephone Encounter (Signed)
COVID order placed.   Salvatore Marvel, MD [facility] [place] [facility] [place]

## 2019-09-23 LAB — NOVEL CORONAVIRUS, NAA: SARS-CoV-2, NAA: NOT DETECTED

## 2019-10-09 ENCOUNTER — Other Ambulatory Visit: Payer: Self-pay | Admitting: Physician Assistant

## 2019-10-09 DIAGNOSIS — F419 Anxiety disorder, unspecified: Secondary | ICD-10-CM

## 2019-10-11 ENCOUNTER — Telehealth: Payer: Self-pay

## 2019-10-11 ENCOUNTER — Telehealth: Payer: Self-pay | Admitting: Allergy & Immunology

## 2019-10-11 DIAGNOSIS — T148XXA Other injury of unspecified body region, initial encounter: Secondary | ICD-10-CM

## 2019-10-11 LAB — CBC WITH DIFFERENTIAL/PLATELET
Absolute Monocytes: 394 cells/uL (ref 200–950)
Basophils Absolute: 48 cells/uL (ref 0–200)
Basophils Relative: 0.7 %
Eosinophils Absolute: 163 cells/uL (ref 15–500)
Eosinophils Relative: 2.4 %
HCT: 42.1 % (ref 35.0–45.0)
Hemoglobin: 14.4 g/dL (ref 11.7–15.5)
Lymphs Abs: 2740 cells/uL (ref 850–3900)
MCH: 30.4 pg (ref 27.0–33.0)
MCHC: 34.2 g/dL (ref 32.0–36.0)
MCV: 89 fL (ref 80.0–100.0)
MPV: 10.2 fL (ref 7.5–12.5)
Monocytes Relative: 5.8 %
Neutro Abs: 3454 cells/uL (ref 1500–7800)
Neutrophils Relative %: 50.8 %
Platelets: 324 10*3/uL (ref 140–400)
RBC: 4.73 10*6/uL (ref 3.80–5.10)
RDW: 12.5 % (ref 11.0–15.0)
Total Lymphocyte: 40.3 %
WBC: 6.8 10*3/uL (ref 3.8–10.8)

## 2019-10-11 NOTE — Telephone Encounter (Signed)
Dr. Ernst Bowler wanted to order lab work due to pt.'s bruising.

## 2019-10-11 NOTE — Telephone Encounter (Signed)
Patient reports bruising on her bilateral LEs for the past week with some intense itching of a rash on her bilateral hands. She had a recent viral infection (COVID swab negative), therefore I suspect some type of viral suppression. However, we are going to be a CBC just in case.  Salvatore Marvel, MD [facility] [place] [facility] [place]

## 2019-10-14 ENCOUNTER — Telehealth: Payer: BC Managed Care – PPO | Admitting: Physician Assistant

## 2019-10-14 DIAGNOSIS — B86 Scabies: Secondary | ICD-10-CM

## 2019-10-14 MED ORDER — PERMETHRIN 5 % EX CREA: 1.0000 "application " | TOPICAL_CREAM | Freq: Once | CUTANEOUS | 0 refills | Status: AC

## 2019-10-14 NOTE — Progress Notes (Signed)
E-Visit for Scabies  We are sorry that you are not feeling well. Here is how we plan to help!  Based on what you shared with me it looks like you have scabies.  Scabies is an infection caused by very tiny mites that burrow into the skin.  They cause severe itching. Though children are most commonly infected, anyone can get scabies.  Scabies mites can pass from person to person through physical close contact.  They can also be passed through shared clothing, towels, and bedding.  Scabies infection is not usually dangerous, but it is uncomfortable.  Because it is so contagious, scabies should be treated immediately to keep the infection from spreading.  If you have children in day care, please have any exposed children examined by their pediatrician. .   I have prescribed Permethrin topical cream 5% thoroughly massage cream from head to soles of feet; leave on for 8 to 14 hours before removing (shower or bath). May repeat if living mites are observed 14 days after first treatment; one application is generally curative.    HOME CARE:  . You should treat all members in your household whether they show symptoms or not. . Wash towels, clothes, bed linens, cloth toys, hats, and other personal items in hot water and dry on high heat. . Vacuum floors and furniture and throw away the bag afterwards. Marland Kitchen Keep your child home from day care or school until the morning after treatment for scabies. . Notify your child's day care or school so that other children can be checked and treated.  GET HELP RIGHT AWAY IF:  . The infected person has a fever, red streaks, pain, or swelling of the skin. . Sores get worse or don't heal. . New rashes appear or itching continues for more than 2 weeks after treatment.  MAKE SURE YOU:   Understand these instructions.  Will watch your condition.  Will get help right away if you are not doing well or get worse.  Thank you for choosing an e-visit. Your e-visit answers  were reviewed by a board certified advanced clinical practitioner to complete your personal care plan.  Depending upon the condition, your plan could have included both over the counter or prescription medications.    Please review your pharmacy choice.  Make sure the pharmacy is open so you can pick up prescription now.   If there is a problem, you may contact your provider through [employer] and have the prescription routed to another pharmacy. Your safety is important to [place].  If you have drug allergies check your prescription carefully.    For the next 24 hours you can use MyChart to ask questions about today's visit, request a non-urgent call back, or ask for a work or school excuse.  You will get an email in the next 2 days asking about your experience.  I hope that your e-visit has been valuable and will speed your recovery.   Particia Nearing PA-C   Approximately 5 minutes was spent documenting and reviewing patient's chart.

## 2019-10-28 DIAGNOSIS — Z87442 Personal history of urinary calculi: Secondary | ICD-10-CM

## 2019-10-28 HISTORY — DX: Personal history of urinary calculi: Z87.442

## 2019-11-03 ENCOUNTER — Telehealth: Payer: BC Managed Care – PPO | Admitting: Family

## 2019-11-03 DIAGNOSIS — B86 Scabies: Secondary | ICD-10-CM | POA: Diagnosis not present

## 2019-11-03 MED ORDER — PERMETHRIN 5 % EX CREA: 1.0000 "application " | TOPICAL_CREAM | Freq: Once | CUTANEOUS | 1 refills | Status: AC

## 2019-11-03 MED ORDER — IVERMECTIN 3 MG PO TABS: 200.0000 ug/kg | ORAL_TABLET | Freq: Once | ORAL | 0 refills | Status: AC

## 2019-11-03 NOTE — Progress Notes (Signed)
E-Visit for Scabies  We are sorry that you are not feeling well. Here is how we plan to help!  Based on what you shared with me it looks like you have scabies.  Scabies is an infection caused by very tiny mites that burrow into the skin.  They cause severe itching. Though children are most commonly infected, anyone can get scabies.  Scabies mites can pass from person to person through physical close contact.  They can also be passed through shared clothing, towels, and bedding.  Scabies infection is not usually dangerous, but it is uncomfortable.  Because it is so contagious, scabies should be treated immediately to keep the infection from spreading.  If you have children in day care, please have any exposed children examined by their pediatrician. .   I have prescribed Permethrin topical cream 5% thoroughly massage cream from head to soles of feet; leave on for 8 to 14 hours before removing (shower or bath). May repeat if living mites are observed 14 days after first treatment; one application is generally curative.   I have also sent in oral Ivermectin that is a one time dose. If your symptoms do not improve you will need to be seen face to face.    HOME CARE:  . You should treat all members in your household whether they show symptoms or not. . Wash towels, clothes, bed linens, cloth toys, hats, and other personal items in hot water and dry on high heat. . Vacuum floors and furniture and throw away the bag afterwards. Marland Kitchen Keep your child home from day care or school until the morning after treatment for scabies. . Notify your child's day care or school so that other children can be checked and treated.  GET HELP RIGHT AWAY IF:  . The infected person has a fever, red streaks, pain, or swelling of the skin. . Sores get worse or don't heal. . New rashes appear or itching continues for more than 2 weeks after treatment.  MAKE SURE YOU:   Understand these instructions.  Will watch your  condition.  Will get help right away if you are not doing well or get worse.  Thank you for choosing an e-visit. Your e-visit answers were reviewed by a board certified advanced clinical practitioner to complete your personal care plan.  Depending upon the condition, your plan could have included both over the counter or prescription medications.    Please review your pharmacy choice.  Make sure the pharmacy is open so you can pick up prescription now.   If there is a problem, you may contact your provider through [employer] and have the prescription routed to another pharmacy. Your safety is important to [place].  If you have drug allergies check your prescription carefully.    For the next 24 hours you can use MyChart to ask questions about today's visit, request a non-urgent call back, or ask for a work or school excuse.  You will get an email in the next 2 days asking about your experience.  I hope that your e-visit has been valuable and will speed your recovery.    Approximately 5 minutes was spent documenting and reviewing patient's chart.

## 2019-11-08 DIAGNOSIS — L700 Acne vulgaris: Secondary | ICD-10-CM | POA: Diagnosis not present

## 2019-11-16 ENCOUNTER — Encounter: Payer: Self-pay | Admitting: Physician Assistant

## 2019-12-16 ENCOUNTER — Encounter: Payer: Self-pay | Admitting: Physician Assistant

## 2019-12-16 ENCOUNTER — Telehealth: Payer: Self-pay | Admitting: Physician Assistant

## 2019-12-16 DIAGNOSIS — F419 Anxiety disorder, unspecified: Secondary | ICD-10-CM

## 2019-12-16 MED ORDER — ESCITALOPRAM OXALATE 10 MG PO TABS: 10.0000 mg | ORAL_TABLET | Freq: Every day | ORAL | 0 refills | Status: DC

## 2019-12-16 NOTE — Telephone Encounter (Signed)
Patient calls and states she was on Lexapro 5mg  and you increased her dose to Lexapro 10mg .  Is out of the med now. Needs new rx sent to pharmacy.  KG LPN

## 2019-12-17 MED ORDER — ESCITALOPRAM OXALATE 10 MG PO TABS: 10.0000 mg | ORAL_TABLET | Freq: Every day | ORAL | 0 refills | Status: DC

## 2019-12-17 NOTE — Telephone Encounter (Signed)
Sent 10mg . Please give me up date on how you are doing.

## 2019-12-17 NOTE — Addendum Note (Signed)
Addended by: Donella Stade on: 12/17/2019 05:37 AM   Modules accepted: Orders

## 2020-01-03 ENCOUNTER — Other Ambulatory Visit: Payer: Self-pay | Admitting: Physician Assistant

## 2020-01-03 DIAGNOSIS — F419 Anxiety disorder, unspecified: Secondary | ICD-10-CM

## 2020-01-19 ENCOUNTER — Encounter: Payer: Self-pay | Admitting: Neurology

## 2020-01-19 DIAGNOSIS — Z9884 Bariatric surgery status: Secondary | ICD-10-CM | POA: Diagnosis not present

## 2020-01-19 DIAGNOSIS — Z130 Encounter for screening for diseases of the blood and blood-forming organs and certain disorders involving the immune mechanism: Secondary | ICD-10-CM

## 2020-01-19 NOTE — Addendum Note (Signed)
Addended byAnnamaria Helling on: 01/19/2020 09:33 AM   Modules accepted: Orders

## 2020-01-19 NOTE — Telephone Encounter (Signed)
Mychart message sent to patient.

## 2020-01-20 LAB — CBC WITH DIFFERENTIAL/PLATELET
Basophils Absolute: 0 10*3/uL (ref 0.0–0.2)
Basos: 0 %
EOS (ABSOLUTE): 0.1 10*3/uL (ref 0.0–0.4)
Eos: 2 %
Hematocrit: 45.4 % (ref 34.0–46.6)
Hemoglobin: 15.1 g/dL (ref 11.1–15.9)
Immature Grans (Abs): 0 10*3/uL (ref 0.0–0.1)
Immature Granulocytes: 0 %
Lymphocytes Absolute: 2.2 10*3/uL (ref 0.7–3.1)
Lymphs: 32 %
MCH: 29.8 pg (ref 26.6–33.0)
MCHC: 33.3 g/dL (ref 31.5–35.7)
MCV: 90 fL (ref 79–97)
Monocytes Absolute: 0.4 10*3/uL (ref 0.1–0.9)
Monocytes: 6 %
Neutrophils Absolute: 4.1 10*3/uL (ref 1.4–7.0)
Neutrophils: 60 %
Platelets: 329 10*3/uL (ref 150–450)
RBC: 5.06 x10E6/uL (ref 3.77–5.28)
RDW: 12.9 % (ref 11.7–15.4)
WBC: 6.9 10*3/uL (ref 3.4–10.8)

## 2020-01-20 LAB — IRON,TIBC AND FERRITIN PANEL
Ferritin: 83 ng/mL (ref 15–150)
Iron Saturation: 25 % (ref 15–55)
Iron: 85 ug/dL (ref 27–159)
Total Iron Binding Capacity: 336 ug/dL (ref 250–450)
UIBC: 251 ug/dL (ref 131–425)

## 2020-01-20 LAB — VITAMIN B12: Vitamin B-12: 718 pg/mL (ref 232–1245)

## 2020-01-20 NOTE — Telephone Encounter (Signed)
[name] Fuentes,   No anemia.  B12 GREAT.  Iron looks perfect.   GREAT labs.   [name]

## 2020-01-27 DIAGNOSIS — H66001 Acute suppurative otitis media without spontaneous rupture of ear drum, right ear: Secondary | ICD-10-CM | POA: Diagnosis not present

## 2020-01-27 DIAGNOSIS — J0140 Acute pansinusitis, unspecified: Secondary | ICD-10-CM | POA: Diagnosis not present

## 2020-02-02 ENCOUNTER — Telehealth: Payer: Self-pay

## 2020-02-02 NOTE — Telephone Encounter (Signed)
Received FMLA paperwork, filled out and faxed back to [employer] @ [phone].

## 2020-03-10 ENCOUNTER — Other Ambulatory Visit: Payer: Self-pay

## 2020-03-10 ENCOUNTER — Encounter: Payer: Self-pay | Admitting: Physician Assistant

## 2020-03-10 DIAGNOSIS — K219 Gastro-esophageal reflux disease without esophagitis: Secondary | ICD-10-CM

## 2020-03-10 MED ORDER — FAMOTIDINE 10 MG PO TABS: 10.0000 mg | ORAL_TABLET | ORAL | 1 refills | Status: DC | PRN

## 2020-03-13 MED ORDER — FAMOTIDINE 20 MG PO TABS: 10.0000 mg | ORAL_TABLET | ORAL | 0 refills | Status: DC | PRN

## 2020-03-13 NOTE — Telephone Encounter (Signed)
Previously written for 10 mg, OK to increase?   Updated RX pended

## 2020-03-17 ENCOUNTER — Telehealth: Payer: BC Managed Care – PPO | Admitting: Family

## 2020-03-17 DIAGNOSIS — J019 Acute sinusitis, unspecified: Secondary | ICD-10-CM

## 2020-03-17 DIAGNOSIS — B9689 Other specified bacterial agents as the cause of diseases classified elsewhere: Secondary | ICD-10-CM

## 2020-03-17 MED ORDER — AMOXICILLIN-POT CLAVULANATE 875-125 MG PO TABS: 1.0000 | ORAL_TABLET | Freq: Two times a day (BID) | ORAL | 0 refills | Status: DC

## 2020-03-17 NOTE — Progress Notes (Signed)

## 2020-03-20 ENCOUNTER — Other Ambulatory Visit: Payer: Self-pay | Admitting: *Deleted

## 2020-03-20 DIAGNOSIS — Z20822 Contact with and (suspected) exposure to covid-19: Secondary | ICD-10-CM

## 2020-03-20 NOTE — Addendum Note (Signed)
Addended by: Hartley Barefoot on: 03/20/2020 11:39 AM   Modules accepted: Orders

## 2020-03-22 LAB — SARS-COV-2, NAA 2 DAY TAT

## 2020-03-22 LAB — NOVEL CORONAVIRUS, NAA: SARS-CoV-2, NAA: NOT DETECTED

## 2020-03-31 ENCOUNTER — Telehealth (INDEPENDENT_AMBULATORY_CARE_PROVIDER_SITE_OTHER): Payer: BC Managed Care – PPO | Admitting: Nurse Practitioner

## 2020-03-31 ENCOUNTER — Encounter: Payer: Self-pay | Admitting: Nurse Practitioner

## 2020-03-31 ENCOUNTER — Encounter: Payer: Self-pay | Admitting: Physician Assistant

## 2020-03-31 DIAGNOSIS — R197 Diarrhea, unspecified: Secondary | ICD-10-CM

## 2020-03-31 DIAGNOSIS — B9781 Human metapneumovirus as the cause of diseases classified elsewhere: Secondary | ICD-10-CM

## 2020-03-31 DIAGNOSIS — J208 Acute bronchitis due to other specified organisms: Secondary | ICD-10-CM

## 2020-03-31 MED ORDER — PREDNISONE 20 MG PO TABS: 20.0000 mg | ORAL_TABLET | Freq: Two times a day (BID) | ORAL | 0 refills | Status: DC

## 2020-03-31 NOTE — Progress Notes (Addendum)
Virtual Visit via MyChart Note VIDEO VISIT  I connected with  Clinton Sawyer on 03/31/20 at  4:10 PM EDT by the video enabled telemedicine application, MyChart, and verified that I am speaking with the correct person using two identifiers.   I introduced myself as a Designer, jewellery with the practice. We discussed the limitations of evaluation and management by telemedicine and the availability of in person appointments. The patient expressed understanding and agreed to proceed.  The patient is: in her car I am: in the office  Subjective:    CC: diarrhea  HPI: [name] Fuentes is a 29 y.o. y/o female presenting via [place] today for ongoing diarrhea. She reports she and her family all came down with a gastrointestinal illness on April 20 with nausea, vomiting, and diarrhea. Last month, her family also became ill with metapneumovirus and they were all put on antibiotics. Her youngest child was hospitalized for symptoms and subsequently had a stool test performed last week for continued diarrhea. His results came back today positive for E. coli and C. diff. She is concerned because of the contagiousness of C. Diff and the ongoing diarrhea that she has been experiencing.   She endorses runny to soft stools daily for several weeks, but worse since being on antibiotics in the last two weeks.  She endorses frequent stools (>7 per day), but reports this is her normal pattern. She endorses occasional presence of mucous in stool, but not every time.  She endorses dizziness. She endorses mild abdominal cramping that comes and goes. She endorses a fever last week when ill with metapneumovirus, but none this week.  She endorses recent use of amoxicillin.  She denies blood in her stool, severe abdominal pain, current fever or chills, nausea or vomiting.   She has not tried anything for her symptoms and nothing seems to make it better or worse.   Bronchitis She would also like to discuss  ongoing shortness of breath, cough, and ear fullness since having metapneumovirus. She did have the physician she works with look in her ears today and listen to her lungs. She reports he stated her ears appear to have clear fluid behind the TM with no signs of infection, and her lungs sounded clear. She reports her symptoms have significantly improved, but since stopping the prednisone taper provided for the virus she is still having symptoms. She is using her albuterol inhaler with some relief, but feels she would benefit from a few additional days on the prednisone.   Past medical history, Surgical history, Family history not pertinant except as noted below, Social history, Allergies, and medications have been entered into the medical record, reviewed, and corrections made.   Review of Systems:  See HPI  Objective:    General: Speaking clearly in complete sentences without any shortness of breath.  Alert and oriented x3.  Normal judgment. No apparent acute distress. She is audibly congested. Appears healthy.  Impression and Recommendations:   1. Diarrhea, unspecified type Diarrhea of unknown origin. It is likely that her symptoms of loose stools are related to the recent metapneumovirus infection and/or antibiotic use. She does not have the classic symptoms of fever, chills, and severe abdominal cramping that are typically associated with C. Diff, however, I do not think it is unreasonable to consider stool testing, given that a member of her household is C. Diff positive.  Given the hour of the appointment and the holiday weekend, we discussed the difficulty of getting a stool sample  to the lab before they close. A joint decision was made to try conservative therapy over the weekend and consider testing next week if symptoms persist.   PLAN: -Ensure you are staying well hydrated. Drink at least 8, 8 ounce glasses of water a day. -Follow a sensitive diet and avoid foods that may cause stomach  upset or diarrhea. Information provided on AVS. -Recommend probiotic treatment to help build natural gastrointestinal flora. -Recommend utilizing imodium or pepto bismol for diarrhea as long as fever and blood are not present.  -Make sure you wash your hands thoroughly after changing the baby's diaper.  -Throw all soiled baby diapers in the trash wrapped inside a plastic bag to avoid contamination with surfaces. -Wash all clothing soiled with stool in hot water separately from other clothing.    2. Acute bronchitis due to human metapneumovirus Symptoms and presentation consistent with bronchitis due to recent metapneuomvirus infection. I feel a 5 day burst of prednisone is reasonable treatment for continued symptoms of cough and shortness of breath. Explained to patient that the ear fullness and popping may take some time to resolve despite steroid therapy.   PLAN:  -5 day predonisone 20 mg twice a day burst -Flonase may be used to help with congestion in the head  Follow-up if symptoms worsen or fail to improve.  - predniSONE (DELTASONE) 20 MG tablet; Take 1 tablet (20 mg total) by mouth 2 (two) times daily with a meal.  Dispense: 10 tablet; Refill: 0    I discussed the assessment and treatment plan with the patient. The patient was provided an opportunity to ask questions and all were answered. The patient agreed with the plan and demonstrated an understanding of the instructions.   The patient was advised to call back or seek an in-person evaluation if the symptoms worsen or if the condition fails to improve as anticipated.  I provided 20 minutes of non-face-to-face interaction with this [place] visit.   Orma Render, NP '

## 2020-03-31 NOTE — Patient Instructions (Addendum)
I recommend taking a probiotic to help with digestive health. [facility] is the equivalent to the brand name Florastor that is often used in the hospital setting and found to be very effective for many patients. You may choose the brand of your choice.   If you develop a fever, severe abdominal cramping, or bloody diarrhea need to consider testing your stool for pathogens. Stool can only be tested if it is a very thin, runny consistency.   You may take over the counter antidiarrheal medication, such as Imodium, if you are not experiencing fever or bloody bowel movements. Refer to the medication packaging for specific directions based on the brand and dose chosen.    Flonase may help with the fullness and popping in your ears. You can purchase this over the counter at the pharmacy. Generic is just as effective as the brand name and fine to use.   Food Choices to Help Relieve Diarrhea, Adult When you have diarrhea, the foods you eat and your eating habits are very important. Choosing the right foods and drinks can help:  Relieve diarrhea.  Replace lost fluids and nutrients.  Prevent dehydration. What general guidelines should I follow?  Relieving diarrhea  Choose foods with less than 2 g or .07 oz. of fiber per serving.  Limit fats to less than 8 tsp (38 g or 1.34 oz.) a day.  Avoid the following: ? Foods and beverages sweetened with high-fructose corn syrup, honey, or sugar alcohols such as xylitol, sorbitol, and mannitol. ? Foods that contain a lot of fat or sugar. ? Fried, greasy, or spicy foods. ? High-fiber grains, breads, and cereals. ? Raw fruits and vegetables.  Eat foods that are rich in probiotics. These foods include dairy products such as yogurt and fermented milk products. They help increase healthy bacteria in the stomach and intestines (gastrointestinal tract, or GI tract).  If you have lactose intolerance, avoid dairy products. These may make your diarrhea  worse.  Take medicine to help stop diarrhea (antidiarrheal medicine) only as told by your health care provider. Replacing nutrients  Eat small meals or snacks every 3-4 hours.  Eat bland foods, such as white rice, toast, or baked potato, until your diarrhea starts to get better. Gradually reintroduce nutrient-rich foods as tolerated or as told by your health care provider. This includes: ? Well-cooked protein foods. ? Peeled, seeded, and soft-cooked fruits and vegetables. ? Low-fat dairy products.  Take vitamin and mineral supplements as told by your health care provider. Preventing dehydration  Start by sipping water or a special solution to prevent dehydration (oral rehydration solution, ORS). Urine that is clear or pale yellow means that you are getting enough fluid.  Try to drink at least 8-10 cups of fluid each day to help replace lost fluids.  You may add other liquids in addition to water, such as clear juice or decaffeinated sports drinks, as tolerated or as told by your health care provider.  Avoid drinks with caffeine, such as coffee, tea, or soft drinks.  Avoid alcohol. What foods are recommended?     The items listed may not be a complete list. Talk with your health care provider about what dietary choices are best for you. Grains White rice. White, [place], or pita breads (fresh or toasted), including plain rolls, buns, or bagels. White pasta. Saltine, soda, or graham crackers. Pretzels. Low-fiber cereal. Cooked cereals made with water (such as cornmeal, farina, or cream cereals). Plain muffins. Matzo. Melba toast. Zwieback. Vegetables  Potatoes (without the skin). Most well-cooked and canned vegetables without skins or seeds. Tender lettuce. Fruits Apple sauce. Fruits canned in juice. Cooked apricots, cherries, grapefruit, peaches, pears, or plums. Fresh bananas and cantaloupe. Meats and other protein foods Baked or boiled chicken. Eggs. Tofu. Fish. Seafood. Smooth nut  butters. Ground or well-cooked tender beef, ham, veal, lamb, pork, or poultry. Dairy Plain yogurt, kefir, and unsweetened liquid yogurt. Lactose-free milk, buttermilk, skim milk, or soy milk. Low-fat or nonfat hard cheese. Beverages Water. Low-calorie sports drinks. Fruit juices without pulp. Strained tomato and vegetable juices. Decaffeinated teas. Sugar-free beverages not sweetened with sugar alcohols. Oral rehydration solutions, if approved by your health care provider. Seasoning and other foods Bouillon, broth, or soups made from recommended foods. What foods are not recommended? The items listed may not be a complete list. Talk with your health care provider about what dietary choices are best for you. Grains Whole grain, whole wheat, bran, or rye breads, rolls, pastas, and crackers. Wild or Linzy Darling rice. Whole grain or bran cereals. Barley. Oats and oatmeal. Corn tortillas or taco shells. Granola. Popcorn. Vegetables Raw vegetables. Fried vegetables. Cabbage, broccoli, Brussels sprouts, artichokes, baked beans, beet greens, corn, kale, legumes, peas, sweet potatoes, and yams. Potato skins. Cooked spinach and cabbage. Fruits Dried fruit, including raisins and dates. Raw fruits. Stewed or dried prunes. Canned fruits with syrup. Meat and other protein foods Fried or fatty meats. Deli meats. Chunky nut butters. Nuts and seeds. Beans and lentils. Berniece Salines. Hot dogs. Sausage. Dairy High-fat cheeses. Whole milk, chocolate milk, and beverages made with milk, such as milk shakes. Half-and-half. Cream. sour cream. Ice cream. Beverages Caffeinated beverages (such as coffee, tea, soda, or energy drinks). Alcoholic beverages. Fruit juices with pulp. Prune juice. Soft drinks sweetened with high-fructose corn syrup or sugar alcohols. High-calorie sports drinks. Fats and oils Butter. Cream sauces. Margarine. Salad oils. Plain salad dressings. Olives. Avocados. Mayonnaise. Sweets and desserts Sweet rolls,  doughnuts, and sweet breads. Sugar-free desserts sweetened with sugar alcohols such as xylitol and sorbitol. Seasoning and other foods Honey. Hot sauce. Chili powder. Gravy. Cream-based or milk-based soups. Pancakes and waffles. Summary  When you have diarrhea, the foods you eat and your eating habits are very important.  Make sure you get at least 8-10 cups of fluid each day, or enough to keep your urine clear or pale yellow.  Eat bland foods and gradually reintroduce healthy, nutrient-rich foods as tolerated, or as told by your health care provider.  Avoid high-fiber, fried, greasy, or spicy foods. This information is not intended to replace advice given to you by your health care provider. Make sure you discuss any questions you have with your health care provider. Document Revised: 02/11/2019 Document Reviewed: 10/18/2016 Elsevier Patient Education  [place].   Diarrhea, Adult Diarrhea is frequent loose and watery bowel movements. Diarrhea can make you feel weak and cause you to become dehydrated. Dehydration can make you tired and thirsty, cause you to have a dry mouth, and decrease how often you urinate. Diarrhea typically lasts 2-3 days. However, it can last longer if it is a sign of something more serious. It is important to treat your diarrhea as told by your health care provider. Follow these instructions at home: Eating and drinking     Follow these recommendations as told by your health care provider:  Take an oral rehydration solution (ORS). This is an over-the-counter medicine that helps return your body to its normal balance of nutrients and water. It  is found at pharmacies and retail stores.  Drink plenty of fluids, such as water, ice chips, diluted fruit juice, and low-calorie sports drinks. You can drink milk also, if desired.  Avoid drinking fluids that contain a lot of sugar or caffeine, such as energy drinks, sports drinks, and soda.  Eat bland,  easy-to-digest foods in small amounts as you are able. These foods include bananas, applesauce, rice, lean meats, toast, and crackers.  Avoid alcohol.  Avoid spicy or fatty foods.  Medicines  Take over-the-counter and prescription medicines only as told by your health care provider.  If you were prescribed an antibiotic medicine, take it as told by your health care provider. Do not stop using the antibiotic even if you start to feel better. General instructions   Wash your hands often using soap and water. If soap and water are not available, use a hand sanitizer. Others in the household should wash their hands as well. Hands should be washed: ? After using the toilet or changing a diaper. ? Before preparing, cooking, or serving food. ? While caring for a sick person or while visiting someone in a hospital.  Drink enough fluid to keep your urine pale yellow.  Rest at home while you recover.  Watch your condition for any changes.  Take a warm bath to relieve any burning or pain from frequent diarrhea episodes.  Keep all follow-up visits as told by your health care provider. This is important. Contact a health care provider if:  You have a fever.  Your diarrhea gets worse.  You have new symptoms.  You cannot keep fluids down.  You feel light-headed or dizzy.  You have a headache.  You have muscle cramps. Get help right away if:  You have chest pain.  You feel extremely weak or you faint.  You have bloody or black stools or stools that look like tar.  You have severe pain, cramping, or bloating in your abdomen.  You have trouble breathing or you are breathing very quickly.  Your heart is beating very quickly.  Your skin feels cold and clammy.  You feel confused.  You have signs of dehydration, such as: ? Dark urine, very little urine, or no urine. ? Cracked lips. ? Dry mouth. ? Sunken eyes. ? Sleepiness. ? Weakness. Summary  Diarrhea is frequent  loose and watery bowel movements. Diarrhea can make you feel weak and cause you to become dehydrated.  Drink enough fluids to keep your urine pale yellow.  Make sure that you wash your hands after using the toilet. If soap and water are not available, use hand sanitizer.  Contact a health care provider if your diarrhea gets worse or you have new symptoms.  Get help right away if you have signs of dehydration. This information is not intended to replace advice given to you by your health care provider. Make sure you discuss any questions you have with your health care provider. Document Revised: 03/09/2019 Document Reviewed: 03/27/2018 Elsevier Patient Education  [place].  Probiotics Probiotics are the good bacteria and yeasts that live in your body and keep your digestive system healthy. Probiotics also help your body's defense system (immune system) and protect your body against the growth of harmful bacteria. Your health care provider may recommend taking a probiotic if you are taking antibiotics or have certain medical conditions, such as:  Diarrhea.  Constipation.  Irritable bowel syndrome.  Lung infections.  Yeast infections.  Acne, eczema, and other skin conditions.  Frequent urinary tract infections. What affects the balance of bacteria in my body? The balance of good bacteria in your body can be affected by:  Antibiotic medicines. These medicines treat infections caused by bacteria. Unfortunately, they may kill the good bacteria in your body as well as the bad bacteria.  Certain medical conditions. Conditions related to an imbalance of bacteria include: ? Stomach and intestine (gastrointestinal) infections. ? Lung infections. ? Skin infections. ? Vaginal infections. ? Inflammatory bowel diseases. ? Stomach ulcers (gastric ulcers). ? Tooth decay and gum disease (periodontal disease).  Stress.  Poor diet. What type of probiotic is right for me?  Probiotics contain different types of bacteria (strains). Strains commonly found in probiotics include:  Lactobacillus.  Saccharomyces.  Bifidobacterium. Specific strains have been shown to be more effective for certain health conditions. Ask your health care provider which strain or strains you should use and how often. Probiotics come in many different forms, strain combinations, and strengths. Some may need to be refrigerated. Always read the label for storage and usage instructions. Certain foods, such as yogurt, contain probiotics. Probiotics can also be bought as a supplement at a pharmacy, health food store, or grocery store. Talk to your health care provider before starting any supplement. What are the side effects of probiotics? Some people have side effects when taking probiotics. Side effects are usually temporary and may include:  Gas.  Bloating.  Cramping. Serious side effects are rare. Follow these instructions at home:   If you are taking probiotics with antibiotics: ? Wait at least 2 hours between taking your medicine and the probiotic. ? Eat foods high in fiber, such as whole grains, beans, and vegetables. These foods can help good bacteria grow. ? Avoid certain foods as told by your health care provider. Summary  Probiotics are the good bacteria and yeasts that live in your body and keep you and your digestive system healthy.  Certain foods, such as yogurt, contain probiotics.  Probiotics can be taken as supplements. They can be bought at a pharmacy, health food store, or grocery store. They come in many different forms, strain combinations, and strengths.  Be sure to talk with your health care provider before taking a probiotic supplement. This information is not intended to replace advice given to you by your health care provider. Make sure you discuss any questions you have with your health care provider. Document Revised: 07/10/2018 Document Reviewed:  11/05/2017 Elsevier Patient Education  2020 [employer].

## 2020-03-31 NOTE — Telephone Encounter (Signed)
Both were scheduled today for [name] Fuentes and [name] Fuentes at 4:00 and 4:10. They did request mychart appts. The son has an appt today somewhere else so that he can be checked out as well.

## 2020-04-11 ENCOUNTER — Other Ambulatory Visit: Payer: Self-pay | Admitting: Physician Assistant

## 2020-04-27 ENCOUNTER — Encounter (HOSPITAL_COMMUNITY): Payer: Self-pay

## 2020-05-03 ENCOUNTER — Encounter: Payer: Self-pay | Admitting: Allergy

## 2020-05-03 ENCOUNTER — Other Ambulatory Visit: Payer: Self-pay

## 2020-05-03 ENCOUNTER — Ambulatory Visit (INDEPENDENT_AMBULATORY_CARE_PROVIDER_SITE_OTHER): Payer: 59 | Admitting: Allergy

## 2020-05-03 ENCOUNTER — Telehealth: Payer: Self-pay

## 2020-05-03 VITALS — BP 118/68 | HR 88 | Temp 99.1°F | Resp 16 | Ht 62.0 in | Wt 267.0 lb

## 2020-05-03 DIAGNOSIS — R12 Heartburn: Secondary | ICD-10-CM | POA: Diagnosis not present

## 2020-05-03 DIAGNOSIS — T50905D Adverse effect of unspecified drugs, medicaments and biological substances, subsequent encounter: Secondary | ICD-10-CM

## 2020-05-03 DIAGNOSIS — Z8709 Personal history of other diseases of the respiratory system: Secondary | ICD-10-CM | POA: Diagnosis not present

## 2020-05-03 DIAGNOSIS — T781XXA Other adverse food reactions, not elsewhere classified, initial encounter: Secondary | ICD-10-CM

## 2020-05-03 DIAGNOSIS — T781XXD Other adverse food reactions, not elsewhere classified, subsequent encounter: Secondary | ICD-10-CM

## 2020-05-03 DIAGNOSIS — R0602 Shortness of breath: Secondary | ICD-10-CM

## 2020-05-03 DIAGNOSIS — J310 Chronic rhinitis: Secondary | ICD-10-CM | POA: Diagnosis not present

## 2020-05-03 DIAGNOSIS — T7840XD Allergy, unspecified, subsequent encounter: Secondary | ICD-10-CM

## 2020-05-03 MED ORDER — PANTOPRAZOLE SODIUM 40 MG PO TBEC: 40.0000 mg | DELAYED_RELEASE_TABLET | Freq: Every day | ORAL | 5 refills | Status: DC

## 2020-05-03 MED ORDER — EPINEPHRINE 0.3 MG/0.3ML IJ SOAJ: 0.3000 mg | INTRAMUSCULAR | 1 refills | Status: DC | PRN

## 2020-05-03 MED ORDER — OLOPATADINE HCL 0.2 % OP SOLN
1.0000 [drp] | Freq: Every day | OPHTHALMIC | 5 refills | Status: DC | PRN
Start: 2020-05-03 — End: 2021-10-08

## 2020-05-03 MED ORDER — ALBUTEROL SULFATE HFA 108 (90 BASE) MCG/ACT IN AERS
2.0000 | INHALATION_SPRAY | RESPIRATORY_TRACT | 2 refills | Status: DC | PRN
Start: 2020-05-03 — End: 2021-04-20

## 2020-05-03 NOTE — Telephone Encounter (Signed)
Patient was bitten by a tick and then consumed pork. Developed oral itching, hives on arms and legs and face, rash and burning all over skin. Same night patient had diarrhea and vomiting. Dizziness and clamminess as well.

## 2020-05-03 NOTE — Telephone Encounter (Signed)
She has never been actually seen in our office as a patient. I recommend that she schedules a new patient visit for evaluation.  She can get the bloodwork drawn before.  Thank you.

## 2020-05-03 NOTE — Progress Notes (Signed)
New Patient Note  RE: [name] Fuentes MRN: [id number] DOB: 06-17-1991 Date of Office Visit: 05/03/2020  Referring provider: Lavada Mesi Primary care provider: Donella Stade, PA-C  Chief Complaint: Food Intolerance, Allergies (throat clearing, eye itchy), and Breathing Problem (can not get a good breath in)  History of Present Illness: I had the pleasure of seeing [name] Fuentes for initial evaluation at the [facility] on 05/04/2020. She is a 29 y.o. female, who is self-referred here for the evaluation of food allergies, allergic rhinitis and breathing problems. Up to date with COVID-19 vaccine: yes  Food: She reports food allergy to red meat.  Patient had pork chops about 2 weeks ago and noticed perioral pruritus and diarrhea after consumption.  A few days later she had leftover pork chops with similar symptoms. She had skin prick testing done to pork which was positive.   She had hamburger, steak and ham with no issues.  However, 2 days ago she had bacon and broke out in hives. She had diarrhea and vomiting at home afterwards as well.   Denies any associated cofactors such as exertion, infection, NSAID use, or alcohol consumption. The symptoms lasted for 1 day. She was not evaluated in ED.  She does not have access to epinephrine autoinjector and not needed to use it.   She started taking a new OTC gummy supplements and is still taking it now.   Past work up includes: none. Dietary History: patient has been eating other foods including milk, eggs, peanut, treenuts, soy, wheat, meats, fruits and vegetables. Does not like seafood/shellfish.   Pineapple causes perioral pruritus.   Rhinitis: She reports symptoms of itchy eyes and dry eyes and frequent sinus. She does wear contacts. Symptoms have been going on for 2 years. The symptoms are present all year around with worsening in summer and fall. Other triggers include exposure to pollen. Anosmia:  no. Headache: yes. She has used Human resources officer, Nasacort and saline eye drops with fair improvement in symptoms. Sinus infections: 5 per year. Previous work up includes: none. Previous ENT evaluation: for ears Previous sinus imaging: no. History of nasal polyps: no. Last eye exam: last year. History of reflux: yes and takes Pepcid 20mg  daily and worse since gastric bypass. Used to be on omeprazole.   Breathing: She reports symptoms of chest heaviness and slight coughing for 2 weeks. Current medications include none which help. She reports not using aerochamber with inhalers. She tried the following inhalers: none. Main triggers are infections. In the last month, frequency of symptoms: daily. Frequency of nocturnal symptoms: 0x/month. Frequency of SABA use: 0x/week. Interference with physical activity: yes. Sleep is undisturbed. In the last 12 months, emergency room visits/urgent care visits/doctor office visits or hospitalizations due to respiratory issues: 0. In the last 12 months, oral steroids courses: 2 rounds. Lifetime history of hospitalization for respiratory issues: 9. Prior intubations: 0. History of pneumonia: no. She was not evaluated by allergist/pulmonologist in the past. Smoking exposure: no. Up to date with flu vaccine: yes. Up to date with pneumonia vaccine: no.   Assessment and Plan: [name] Fuentes is a 29 y.o. female with: Adverse food reaction Broke out in urticaria after eating bacon at work.  She also had diarrhea and vomiting afterwards at home.  2 weeks prior she had pork chops and noticed perioral pruritus and diarrhea on 2 separate occasions.  She also had recent tick bite.  No issues with hamburger, steak and ham.  Pineapple  cause perioral pruritus in the past.  Unable to skin test today due to recent antihistamine intake.  Continue to avoid red meat - including beef, venison, pork, lamb.  Avoid pineapple as well.   I have prescribed epinephrine injectable and demonstrated proper  use. For mild symptoms you can take over the counter antihistamines such as Benadryl and monitor symptoms closely. If symptoms worsen or if you have severe symptoms including breathing issues, throat closure, significant swelling, whole body hives, severe diarrhea and vomiting, lightheadedness then inject epinephrine and seek immediate medical care afterwards.  Food action plan given.  Get blood work to check for alpha gal panel.  Chronic rhinitis Perennial rhinoconjunctivitis symptoms for 2 years with worsening summer and fall.  Noted hives after her new puppy licked her skin.  Tried Allegra, Nasacort and saline eyedrops with some benefit.  Wears contacts on a daily basis.  Has 5 sinus infections per year.  Unable to skin test today due to recent antihistamine intake.  Get bloodwork to check for environmental allergies.  May use over the counter antihistamines such as Zyrtec (cetirizine), Claritin (loratadine), Allegra (fexofenadine), or Xyzal (levocetirizine) daily as needed. May take twice a day if needed.  Continue with Nasacort 1-2 sprays per nostril daily as needed for sinus symptoms.  May use olopatadine eye drops 0.2% once a day as needed for itchy/watery eyes.  Do not place over contacts. If you have to use the eye drops, wait about 10-15 minutes before putting contacts back in.  See below for pollen and pet avoidance measures.  Shortness of breath Noticed some chest heaviness, shortness of breath and coughing for the last few weeks which started after an upper respiratory infection.  She has been using her daughter's albuterol nebulizer with good benefit.  No previous asthma or reactive airway disease diagnosis.  Today's spirometry was normal with no improvement in FEV1 post bronchodilator treatment however patient feeling clinically improved.  May use albuterol rescue inhaler 2 puffs or nebulizer every 4 to 6 hours as needed for shortness of breath, chest tightness, coughing,  and wheezing. May use albuterol rescue inhaler 2 puffs 5 to 15 minutes prior to strenuous physical activities. Monitor frequency of use.   Heartburn Patient has history of reflux and used to be on PPI.  Currently on Pepcid 20 mg daily.  Discussed with patient concerned about heartburn contributing to her chest tightness symptoms.  Start Protonix 40mg  in the morning. Nothing to eat or drink for 30 minutes afterwards.   See below for heartburn lifestyle modifications diet.   Drug reaction Developed some type of bruising after using Accutane in the past.  Continue avoidance.  Return in about 4 months (around 09/02/2020).  Meds ordered this encounter  Medications  . pantoprazole (PROTONIX) 40 MG tablet    Sig: Take 1 tablet (40 mg total) by mouth daily.    Dispense:  30 tablet    Refill:  5  . EPINEPHrine (AUVI-Q) 0.3 mg/0.3 mL IJ SOAJ injection    Sig: Inject 0.3 mLs (0.3 mg total) into the muscle as needed for anaphylaxis.    Dispense:  1 each    Refill:  1  . Olopatadine HCl 0.2 % SOLN    Sig: Apply 1 drop to eye daily as needed (itchy/watery eyes).    Dispense:  2.5 mL    Refill:  5  . albuterol (VENTOLIN HFA) 108 (90 Base) MCG/ACT inhaler    Sig: Inhale 2 puffs into the lungs every 4 (four)  hours as needed for wheezing or shortness of breath (coughing fit).    Dispense:  18 g    Refill:  2    Lab Orders     Alpha-Gal Panel     Allergens w/Total IgE Area 2     CBC with Differential/Platelet     Strep pneumoniae 23 Serotypes IgG     IgG, IgA, IgM     Complement, total     Diphtheria / Tetanus Antibody Panel     Allergen, Pineapple, [id number]     Tryptase  Other allergy screening: Medication allergy:  Accutane - bruising Hymenoptera allergy: no Urticaria: no Eczema:no  Psoriasis  History of recurrent infections suggestive of immunodeficency:  Patient has history multiple infections including sinus infection, ear infections, bronchitis. Denies any GI  infections/diarrhea, skin infections/abscesses. Patient has no history of opportunistic infections including fungal infections, viral infections.   Patient reports 6 antibiotic use in the last 12 months and  1 hospital admissions as a teenager Patient does not have any secondary causes of immunodeficiency including chronic steroid use, diabetes mellitus, protein losing enteropathy, renal or hepatic dysfunction, history of cancer or irradiation or history of HIV, hepatitis B or C.  Diagnostics: Spirometry:  Tracings reviewed. Her effort: Good reproducible efforts. FVC: 3.72L FEV1: 3.22L, 106% predicted FEV1/FVC ratio: 87% Interpretation: Spirometry consistent with normal pattern with no improvement in FEV1 post bronchodilator treatment.  Please see scanned spirometry results for details.  Skin Testing: Deferred due to recent antihistamines use.  Past Medical History: Patient Active Problem List   Diagnosis Date Noted  . Allergic reaction 05/04/2020  . Drug reaction 05/04/2020  . Heartburn 05/04/2020  . History of frequent upper respiratory infection 05/03/2020  . Chronic rhinitis 05/03/2020  . Adverse food reaction 05/03/2020  . Shortness of breath 05/03/2020  . Anxiety 09/20/2019  . Panic attacks 09/20/2019  . 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 Otalgia, right 10/18/2018  . 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 Elevated ALT measurement 04/28/2018  . Elevated TSH 04/27/2018  . Body aches 04/27/2018  . Psoriasis 03/12/2017  . PCOS (polycystic ovarian syndrome) 03/12/2017  . Hypertension goal BP (blood pressure) < 130/80 03/12/2017  . Elevated blood pressure reading 03/12/2017  . Morbid obesity ([place]) 03/12/2017  . Gastroesophageal reflux disease without esophagitis 03/12/2017   Past Medical History:  Diagnosis Date  . Dyspnea    on exertion   . GERD (gastroesophageal reflux disease)   . History of kidney stones 10/28/2019   passed   .  Hydradenitis   . Hypertension    no longer on medication  . Obese   . PCOS (polycystic ovarian syndrome)   . Psoriasis    Past Surgical History: Past Surgical History:  Procedure Laterality Date  . ANKLE RECONSTRUCTION Left    L ankle; plates and screws in place   . CHOLECYSTECTOMY N/A 12/01/2018   Procedure: LAPAROSCOPIC CHOLECYSTECTOMY WITH INTRAOPERATIVE CHOLANGIOGRAM;  Surgeon: Excell Seltzer, MD;  Location: [facility];  Service: General;  Laterality: N/A;  . LAPAROSCOPIC GASTRIC SLEEVE RESECTION N/A 09/28/2018   Procedure: LAPAROSCOPIC GASTRIC SLEEVE RESECTION, UPPER ENDO, ERAS PATHWAY;  Surgeon: Excell Seltzer, MD;  Location: [facility];  Service: General;  Laterality: N/A;  . [place] Plasty    . TONSILLECTOMY    . TYMPANOSTOMY TUBE PLACEMENT     several sets  . WISDOM TOOTH EXTRACTION     Medication List:  Current Outpatient Medications  Medication Sig Dispense Refill  . clobetasol cream (TEMOVATE) 0.05 %  Apply 1 application topically 2 (two) times daily. (Patient taking differently: Apply 1 application topically 2 (two) times daily as needed (psoriasis). ) 60 g 2  . escitalopram (LEXAPRO) 10 MG tablet TAKE 1 TABLET BY MOUTH EVERY DAY 30 tablet 0  . famotidine (PEPCID) 20 MG tablet Take 0.5 tablets (10 mg total) by mouth as needed for heartburn or indigestion. 90 tablet 0  . golimumab 2 mg/kg in sodium chloride 0.9 % Inject 2 mg/kg into the vein once.    . Levonorgestrel (KYLEENA) 19.5 MG IUD 1 each by Intrauterine route once.    Marland Kitchen albuterol (VENTOLIN HFA) 108 (90 Base) MCG/ACT inhaler Inhale 2 puffs into the lungs every 4 (four) hours as needed for wheezing or shortness of breath (coughing fit). 18 g 2  . EPINEPHrine (AUVI-Q) 0.3 mg/0.3 mL IJ SOAJ injection Inject 0.3 mLs (0.3 mg total) into the muscle as needed for anaphylaxis. 1 each 1  . hydrOXYzine (ATARAX/VISTARIL) 25 MG tablet Take 1 tablet (25 mg total) by mouth every 8 (eight) hours as needed. (Patient not taking:  Reported on 05/03/2020) 60 tablet 0  . Olopatadine HCl 0.2 % SOLN Apply 1 drop to eye daily as needed (itchy/watery eyes). 2.5 mL 5  . pantoprazole (PROTONIX) 40 MG tablet Take 1 tablet (40 mg total) by mouth daily. 30 tablet 5   No current facility-administered medications for this visit.   Allergies: Allergies  Allergen Reactions  . Accutane [Isotretinoin]     ? bruising   Social History: Social History   Socioeconomic History  . Marital status: Married    Spouse name: Not on file  . Number of children: Not on file  . Years of education: Not on file  . Highest education level: Not on file  Occupational History  . Not on file  Tobacco Use  . Smoking status: Never Smoker  . Smokeless tobacco: Never Used  Vaping Use  . Vaping Use: Some days  Substance and Sexual Activity  . Alcohol use: Yes    Comment: occ  . Drug use: No  . Sexual activity: Yes  Other Topics Concern  . Not on file  Social History Narrative  . Not on file   Social Determinants of Health   Financial Resource Strain:   . Difficulty of Paying Living Expenses:   Food Insecurity:   . Worried About Charity fundraiser in the Last Year:   . Arboriculturist in the Last Year:   Transportation Needs:   . Film/video editor (Medical):   Marland Kitchen Lack of Transportation (Non-Medical):   Physical Activity:   . Days of Exercise per Week:   . Minutes of Exercise per Session:   Stress:   . Feeling of Stress :   Social Connections:   . Frequency of Communication with Friends and Family:   . Frequency of Social Gatherings with Friends and Family:   . Attends Religious Services:   . Active Member of Clubs or Organizations:   . Attends Archivist Meetings:   Marland Kitchen Marital Status:    Lives in a home. 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 Hypertension Mother   . Hypertension Father   . Hypertension Brother   . Hypertension Maternal Grandfather   .  Cancer Paternal Grandfather        liver  . Stroke Paternal Grandfather   . Allergic rhinitis Neg Hx   . Angioedema Neg Hx   . Asthma Neg Hx   . Eczema Neg Hx   . Immunodeficiency Neg Hx   . Urticaria Neg Hx    Review of Systems  Constitutional: Negative for appetite change, chills, fever and unexpected weight change.  HENT: Negative for congestion and rhinorrhea.   Eyes: Negative for itching.  Respiratory: Positive for chest tightness. Negative for cough, shortness of breath and wheezing.   Cardiovascular: Negative for chest pain.  Gastrointestinal: Negative for abdominal pain.  Genitourinary: Negative for difficulty urinating.  Skin: Positive for rash.  Neurological: Negative for headaches.   Objective: BP 118/68   Pulse 88   Temp 99.1 F (37.3 C) (Temporal)   Resp 16   Ht 5\' 2"  (1.575 m)   Wt 267 lb (121.1 kg)   BMI 48.83 kg/m  Body mass index is 48.83 kg/m. Physical Exam Vitals and nursing note reviewed.  Constitutional:      Appearance: Normal appearance. She is well-developed.  HENT:     Head: Normocephalic and atraumatic.     Right Ear: External ear normal.     Left Ear: External ear normal.     Nose: Nose normal.     Mouth/Throat:     Mouth: Mucous membranes are moist.     Pharynx: Oropharynx is clear.  Eyes:     Conjunctiva/sclera: Conjunctivae normal.  Cardiovascular:     Rate and Rhythm: Normal rate and regular rhythm.     Heart sounds: Normal heart sounds. No murmur heard.  No friction rub. No gallop.   Pulmonary:     Effort: Pulmonary effort is normal.     Breath sounds: Normal breath sounds. No wheezing, rhonchi or rales.  Musculoskeletal:     Cervical back: Neck supple.  Skin:    General: Skin is warm.     Findings: No rash.  Neurological:     Mental  Status: She is alert and oriented to person, place, and time.  Psychiatric:        Mood and Affect: Mood normal.        Behavior: Behavior normal.    The plan was reviewed with the patient/family, and all questions/concerned were addressed.  It was my pleasure to see [name] Fuentes today and participate in her care. Please feel free to contact me with any questions or concerns.  Sincerely,  Rexene Alberts, DO Allergy & Immunology  [facility] office: [phone] [facility] office: [place] office: [phone]

## 2020-05-03 NOTE — Patient Instructions (Addendum)
Food allergy:  Continue to avoid red meat - including beef, venison, pork, lamb.  Avoid pineapple as well.   I have prescribed epinephrine injectable and demonstrated proper use. For mild symptoms you can take over the counter antihistamines such as Benadryl and monitor symptoms closely. If symptoms worsen or if you have severe symptoms including breathing issues, throat closure, significant swelling, whole body hives, severe diarrhea and vomiting, lightheadedness then inject epinephrine and seek immediate medical care afterwards.  Food action plan given. Get bloodwork:  We are ordering labs, so please allow 1-2 weeks for the results to come back. With the newly implemented Cures Act, the labs might be visible to you at the same time that they become visible to me. However, I will not address the results until all of the results are back, so please be patient.  In the meantime, continue recommendations in your patient instructions, including avoidance measures (if applicable), until you hear from me.  Environmental allergies:  Get bloodwork to check for environmental allergies.  May use over the counter antihistamines such as Zyrtec (cetirizine), Claritin (loratadine), Allegra (fexofenadine), or Xyzal (levocetirizine) daily as needed. May take twice a day if needed.  Continue with Nasacort 1-2 sprays per nostril daily as needed for sinus symptoms.  May use olopatadine eye drops 0.2% once a day as needed for itchy/watery eyes.  Do not place over contacts. If you have to use the eye drops, wait about 10-15 minutes before putting contacts back in.  See below for pollen and pet avoidance measures.  Chest tightness:  Breathing test normal.  May use albuterol rescue inhaler 2 puffs or nebulizer every 4 to 6 hours as needed for shortness of breath, chest tightness, coughing, and wheezing. May use albuterol rescue inhaler 2 puffs 5 to 15 minutes prior to strenuous physical activities. Monitor  frequency of use.   Heartburn:   Concerning if your reflux is acting up.  Start Protonix 40mg  in the morning. Nothing to eat or drink for 30 minutes afterwards.   See below for heartburn lifestyle modifications diet.   Follow up in 4 months or sooner if needed.  Reducing Pollen Exposure . Pollen seasons: trees (spring), grass (summer) and ragweed/weeds (fall). Marland Kitchen Keep windows closed in your home and car to lower pollen exposure.  Susa Simmonds air conditioning in the bedroom and throughout the house if possible.  . Avoid going out in dry windy days - especially early morning. . Pollen counts are highest between 5 - 10 AM and on dry, hot and windy days.  . Save outside activities for late afternoon or after a heavy rain, when pollen levels are lower.  . Avoid mowing of grass if you have grass pollen allergy. Marland Kitchen Be aware that pollen can also be transported indoors on people and pets.  . Dry your clothes in an automatic dryer rather than hanging them outside where they might collect pollen.  . Rinse hair and eyes before bedtime.  Pet Allergen Avoidance: . Contrary to popular opinion, there are no "hypoallergenic" breeds of dogs or cats. That is because people are not allergic to an animal's hair, but to an allergen found in the animal's saliva, dander (dead skin flakes) or urine. Pet allergy symptoms typically occur within minutes. For some people, symptoms can build up and become most severe 8 to 12 hours after contact with the animal. People with severe allergies can experience reactions in public places if dander has been transported on the pet owners' clothing. Marland Kitchen  Keeping an animal outdoors is only a partial solution, since homes with pets in the yard still have higher concentrations of animal allergens. . Before getting a pet, ask your allergist to determine if you are allergic to animals. If your pet is already considered part of your family, try to minimize contact and keep the pet out of the  bedroom and other rooms where you spend a great deal of time. . As with dust mites, vacuum carpets often or replace carpet with a hardwood floor, tile or linoleum. . High-efficiency particulate air (HEPA) cleaners can reduce allergen levels over time. . While dander and saliva are the source of cat and dog allergens, urine is the source of allergens from rabbits, hamsters, mice and [place] pigs; so ask a non-allergic family member to clean the animal's cage. . If you have a pet allergy, talk to your allergist about the potential for allergy immunotherapy (allergy shots). This strategy can often provide long-term relief.   Heartburn Heartburn is a type of pain or discomfort that can happen in the throat or chest. It is often described as a burning pain. It may also cause a bad, acid-like taste in the mouth. Heartburn may feel worse when you lie down or bend over. It may be worse at night. It may be caused by stomach contents that move back up (reflux) into the tube that connects the mouth with the stomach (esophagus). Follow these instructions at home: Eating and drinking   Avoid certain foods and drinks as told by your doctor. This may include: ? Coffee and tea (with or without caffeine). ? Drinks that have alcohol. ? Energy drinks and sports drinks. ? Carbonated drinks or sodas. ? Chocolate and cocoa. ? Peppermint and mint flavorings. ? Garlic and onions. ? Horseradish. ? Spicy and acidic foods, such as:  Peppers.  Chili powder and curry powder.  Vinegar.  Hot sauces and BBQ sauce. ? Citrus fruit juices and citrus fruits, such as:  Oranges.  Lemons.  Limes. ? Tomato-based foods, such as:  Red sauce and pizza with red sauce.  Chili.  Salsa. ? Fried and fatty foods, such as:  Donuts.  [place] fries and potato chips.  High-fat dressings. ? High-fat meats, such as:  Hot dogs and sausage.  Rib eye steak.  Ham and bacon. ? High-fat dairy items, such as:  Whole  milk.  Butter.  Cream cheese.  Eat small meals often. Avoid eating large meals.  Avoid drinking large amounts of liquid with your meals.  Avoid eating meals during the 2-3 hours before bedtime.  Avoid lying down right after you eat.  Do not exercise right after you eat. Lifestyle      If you are overweight, lose an amount of weight that is healthy for you. Ask your doctor about a safe weight loss goal.  Do not use any products that contain nicotine or tobacco, including cigarettes, e-cigarettes, and chewing tobacco. These can make your symptoms worse. If you need help quitting, ask your doctor.  Wear loose clothes. Do not wear anything tight around your waist.  Raise (elevate) the head of your bed about 6 inches (15 cm) when you sleep.  Try to lower your stress. If you need help doing this, ask your doctor. General instructions  Pay attention to any changes in your symptoms.  Take over-the-counter and prescription medicines only as told by your doctor. ? Do not take aspirin, ibuprofen, or other NSAIDs unless your doctor says it is okay. ?  Stop medicines only as told by your doctor.  Keep all follow-up visits as told by your doctor. This is important. Contact a doctor if:  You have new symptoms.  You lose weight and you do not know why it is happening.  You have trouble swallowing, or it hurts to swallow.  You have wheezing or a cough that keeps happening.  Your symptoms do not get better with treatment.  You have heartburn often for more than 2 weeks. Get help right away if:  You have pain in your arms, neck, jaw, teeth, or back.  You feel sweaty, dizzy, or light-headed.  You have chest pain or shortness of breath.  You throw up (vomit) and your throw up looks like blood or coffee grounds.  Your poop (stool) is bloody or black. These symptoms may represent a serious problem that is an emergency. Do not wait to see if the symptoms will go away. Get medical  help right away. Call your local emergency services (911 in the [place] Do not drive yourself to the hospital. Summary  Heartburn is a type of pain that can happen in the throat or chest. It can feel like a burning pain. It may also cause a bad, acid-like taste in the mouth.  You may need to avoid certain foods and drinks to help your symptoms. Ask your doctor what foods and drinks you should avoid.  Take over-the-counter and prescription medicines only as told by your doctor. Do not take aspirin, ibuprofen, or other NSAIDs unless your doctor told you to do so.  Contact your doctor if your symptoms do not get better or they get worse. This information is not intended to replace advice given to you by your health care provider. Make sure you discuss any questions you have with your health care provider. Document Revised: 03/23/2018 Document Reviewed: 03/23/2018 Elsevier Patient Education  [place].

## 2020-05-04 ENCOUNTER — Other Ambulatory Visit: Payer: Self-pay | Admitting: Physician Assistant

## 2020-05-04 ENCOUNTER — Encounter: Payer: Self-pay | Admitting: Allergy

## 2020-05-04 DIAGNOSIS — T50905A Adverse effect of unspecified drugs, medicaments and biological substances, initial encounter: Secondary | ICD-10-CM | POA: Insufficient documentation

## 2020-05-04 DIAGNOSIS — T7840XA Allergy, unspecified, initial encounter: Secondary | ICD-10-CM | POA: Insufficient documentation

## 2020-05-04 DIAGNOSIS — R12 Heartburn: Secondary | ICD-10-CM | POA: Insufficient documentation

## 2020-05-04 NOTE — Assessment & Plan Note (Signed)
Noticed some chest heaviness, shortness of breath and coughing for the last few weeks which started after an upper respiratory infection.  She has been using her daughter's albuterol nebulizer with good benefit.  No previous asthma or reactive airway disease diagnosis.  Today's spirometry was normal with no improvement in FEV1 post bronchodilator treatment however patient feeling clinically improved.  May use albuterol rescue inhaler 2 puffs or nebulizer every 4 to 6 hours as needed for shortness of breath, chest tightness, coughing, and wheezing. May use albuterol rescue inhaler 2 puffs 5 to 15 minutes prior to strenuous physical activities. Monitor frequency of use.

## 2020-05-04 NOTE — Assessment & Plan Note (Signed)
Patient has history of reflux and used to be on PPI.  Currently on Pepcid 20 mg daily.  Discussed with patient concerned about heartburn contributing to her chest tightness symptoms.  Start Protonix 40mg  in the morning. Nothing to eat or drink for 30 minutes afterwards.   See below for heartburn lifestyle modifications diet.

## 2020-05-04 NOTE — Assessment & Plan Note (Signed)
Perennial rhinoconjunctivitis symptoms for 2 years with worsening summer and fall.  Noted hives after her new puppy licked her skin.  Tried Allegra, Nasacort and saline eyedrops with some benefit.  Wears contacts on a daily basis.  Has 5 sinus infections per year.  Unable to skin test today due to recent antihistamine intake.  Get bloodwork to check for environmental allergies.  May use over the counter antihistamines such as Zyrtec (cetirizine), Claritin (loratadine), Allegra (fexofenadine), or Xyzal (levocetirizine) daily as needed. May take twice a day if needed.  Continue with Nasacort 1-2 sprays per nostril daily as needed for sinus symptoms.  May use olopatadine eye drops 0.2% once a day as needed for itchy/watery eyes.  Do not place over contacts. If you have to use the eye drops, wait about 10-15 minutes before putting contacts back in.  See below for pollen and pet avoidance measures.

## 2020-05-04 NOTE — Assessment & Plan Note (Signed)
Developed some type of bruising after using Accutane in the past.  Continue avoidance.

## 2020-05-04 NOTE — Telephone Encounter (Signed)
Patient was seen as new patient on 05-03-20.

## 2020-05-04 NOTE — Assessment & Plan Note (Signed)
Broke out in urticaria after eating bacon at work.  She also had diarrhea and vomiting afterwards at home.  2 weeks prior she had pork chops and noticed perioral pruritus and diarrhea on 2 separate occasions.  She also had recent tick bite.  No issues with hamburger, steak and ham.  Pineapple cause perioral pruritus in the past.  Unable to skin test today due to recent antihistamine intake.  Continue to avoid red meat - including beef, venison, pork, lamb.  Avoid pineapple as well.   I have prescribed epinephrine injectable and demonstrated proper use. For mild symptoms you can take over the counter antihistamines such as Benadryl and monitor symptoms closely. If symptoms worsen or if you have severe symptoms including breathing issues, throat closure, significant swelling, whole body hives, severe diarrhea and vomiting, lightheadedness then inject epinephrine and seek immediate medical care afterwards.  Food action plan given.  Get blood work to check for alpha gal panel.

## 2020-05-05 LAB — TRYPTASE: Tryptase: 4.1 ug/L (ref 2.2–13.2)

## 2020-05-06 ENCOUNTER — Encounter (HOSPITAL_BASED_OUTPATIENT_CLINIC_OR_DEPARTMENT_OTHER): Payer: Self-pay

## 2020-05-06 ENCOUNTER — Emergency Department (HOSPITAL_BASED_OUTPATIENT_CLINIC_OR_DEPARTMENT_OTHER)
Admission: EM | Admit: 2020-05-06 | Discharge: 2020-05-06 | Disposition: A | Discharge status: Home / Self Care | Payer: 59 | Attending: Emergency Medicine | Admitting: Emergency Medicine

## 2020-05-06 ENCOUNTER — Emergency Department (HOSPITAL_BASED_OUTPATIENT_CLINIC_OR_DEPARTMENT_OTHER): Payer: 59

## 2020-05-06 ENCOUNTER — Other Ambulatory Visit: Payer: Self-pay

## 2020-05-06 DIAGNOSIS — Y939 Activity, unspecified: Secondary | ICD-10-CM | POA: Insufficient documentation

## 2020-05-06 DIAGNOSIS — Y929 Unspecified place or not applicable: Secondary | ICD-10-CM | POA: Insufficient documentation

## 2020-05-06 DIAGNOSIS — S59901A Unspecified injury of right elbow, initial encounter: Secondary | ICD-10-CM | POA: Diagnosis not present

## 2020-05-06 DIAGNOSIS — S80212A Abrasion, left knee, initial encounter: Secondary | ICD-10-CM | POA: Diagnosis not present

## 2020-05-06 DIAGNOSIS — S5011XA Contusion of right forearm, initial encounter: Secondary | ICD-10-CM

## 2020-05-06 DIAGNOSIS — Y999 Unspecified external cause status: Secondary | ICD-10-CM | POA: Diagnosis not present

## 2020-05-06 DIAGNOSIS — S50811A Abrasion of right forearm, initial encounter: Secondary | ICD-10-CM | POA: Insufficient documentation

## 2020-05-06 DIAGNOSIS — Z79899 Other long term (current) drug therapy: Secondary | ICD-10-CM | POA: Diagnosis not present

## 2020-05-06 DIAGNOSIS — I10 Essential (primary) hypertension: Secondary | ICD-10-CM | POA: Diagnosis not present

## 2020-05-06 DIAGNOSIS — S59911A Unspecified injury of right forearm, initial encounter: Secondary | ICD-10-CM | POA: Diagnosis not present

## 2020-05-06 DIAGNOSIS — R600 Localized edema: Secondary | ICD-10-CM | POA: Diagnosis not present

## 2020-05-06 DIAGNOSIS — M7989 Other specified soft tissue disorders: Secondary | ICD-10-CM | POA: Diagnosis not present

## 2020-05-06 HISTORY — DX: Allergy to other foods: Z91.018
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
## 2020-05-06 NOTE — ED Notes (Signed)
Patient transported to X-ray 
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
## 2020-05-06 NOTE — Discharge Instructions (Signed)
Take tylenol, motrin for pain. Apply ice for comfort   See your doctor   Return to ER if you have worse elbow pain or swelling, headaches, vomiting

## 2020-05-07 ENCOUNTER — Other Ambulatory Visit: Payer: Self-pay | Admitting: Nurse Practitioner

## 2020-05-07 DIAGNOSIS — K219 Gastro-esophageal reflux disease without esophagitis: Secondary | ICD-10-CM

## 2020-05-10 LAB — COMPLEMENT, TOTAL: Compl, Total (CH50): >60 U/mL (ref >41)

## 2020-05-10 LAB — CBC WITH DIFFERENTIAL/PLATELET
Basophils Absolute: 0 10*3/uL (ref 0.0–0.2)
Basos: 1 %
EOS (ABSOLUTE): 0.1 10*3/uL (ref 0.0–0.4)
Eos: 1 %
Hematocrit: 44.6 % (ref 34.0–46.6)
Hemoglobin: 14.6 g/dL (ref 11.1–15.9)
Immature Grans (Abs): 0 10*3/uL (ref 0.0–0.1)
Immature Granulocytes: 0 %
Lymphocytes Absolute: 2.1 10*3/uL (ref 0.7–3.1)
Lymphs: 25 %
MCH: 29.3 pg (ref 26.6–33.0)
MCHC: 32.7 g/dL (ref 31.5–35.7)
MCV: 90 fL (ref 79–97)
Monocytes Absolute: 0.6 10*3/uL (ref 0.1–0.9)
Monocytes: 7 %
Neutrophils Absolute: 5.6 10*3/uL (ref 1.4–7.0)
Neutrophils: 66 %
Platelets: 327 10*3/uL (ref 150–450)
RBC: 4.98 x10E6/uL (ref 3.77–5.28)
RDW: 12.5 % (ref 11.7–15.4)
WBC: 8.4 10*3/uL (ref 3.4–10.8)

## 2020-05-10 LAB — ALLERGEN, PINEAPPLE, F210: Pineapple IgE: <0.1 kU/L

## 2020-05-10 LAB — ALPHA-GAL PANEL
Alpha Gal IgE*: <0.1 kU/L (ref <0.10)
Alpha Gal IgE*: <0.1 kU/L (ref <0.10)
Beef (Bos spp) IgE: <0.1 kU/L (ref <0.35)
Beef (Bos spp) IgE: <0.1 kU/L (ref <0.35)
Class Interpretation: 0
Class Interpretation: 0
Class Interpretation: 0
Class Interpretation: 0
Class Interpretation: 0
Class Interpretation: 0
Lamb/Mutton (Ovis spp) IgE: <0.1 kU/L (ref <0.35)
Lamb/Mutton (Ovis spp) IgE: <0.1 kU/L (ref <0.35)
Pork (Sus spp) IgE: <0.1 kU/L (ref <0.35)
Pork (Sus spp) IgE: <0.1 kU/L (ref <0.35)

## 2020-05-10 LAB — STREP PNEUMONIAE 23 SEROTYPES IGG
Pneumo Ab Type 1*: 0.5 ug/mL — ABNORMAL LOW (ref >1.3)
Pneumo Ab Type 12 (12F)*: 0.6 ug/mL — ABNORMAL LOW (ref >1.3)
Pneumo Ab Type 14*: 6.5 ug/mL (ref >1.3)
Pneumo Ab Type 17 (17F)*: 0.3 ug/mL — ABNORMAL LOW (ref >1.3)
Pneumo Ab Type 19 (19F)*: 6.2 ug/mL (ref >1.3)
Pneumo Ab Type 2*: 1.1 ug/mL — ABNORMAL LOW (ref >1.3)
Pneumo Ab Type 20*: 2 ug/mL (ref >1.3)
Pneumo Ab Type 22 (22F)*: 1.6 ug/mL (ref >1.3)
Pneumo Ab Type 23 (23F)*: 0.2 ug/mL — ABNORMAL LOW (ref >1.3)
Pneumo Ab Type 26 (6B)*: 0.4 ug/mL — ABNORMAL LOW (ref >1.3)
Pneumo Ab Type 3*: 3 ug/mL (ref >1.3)
Pneumo Ab Type 34 (10A)*: 1.2 ug/mL — ABNORMAL LOW (ref >1.3)
Pneumo Ab Type 4*: 0.5 ug/mL — ABNORMAL LOW (ref >1.3)
Pneumo Ab Type 43 (11A)*: 2.3 ug/mL (ref >1.3)
Pneumo Ab Type 5*: 5.5 ug/mL (ref >1.3)
Pneumo Ab Type 51 (7F)*: 0.3 ug/mL — ABNORMAL LOW (ref >1.3)
Pneumo Ab Type 54 (15B)*: 4 ug/mL (ref >1.3)
Pneumo Ab Type 56 (18C)*: 0.3 ug/mL — ABNORMAL LOW (ref >1.3)
Pneumo Ab Type 57 (19A)*: 5.1 ug/mL (ref >1.3)
Pneumo Ab Type 68 (9V)*: 1.5 ug/mL (ref >1.3)
Pneumo Ab Type 70 (33F)*: 0.3 ug/mL — ABNORMAL LOW (ref >1.3)
Pneumo Ab Type 8*: 0.2 ug/mL — ABNORMAL LOW (ref >1.3)
Pneumo Ab Type 9 (9N)*: 1.2 ug/mL — ABNORMAL LOW (ref >1.3)

## 2020-05-10 LAB — ALLERGENS W/TOTAL IGE AREA 2
Alternaria Alternata IgE: <0.1 kU/L
Aspergillus Fumigatus IgE: <0.1 kU/L
Bermuda Grass IgE: 0.54 kU/L — AB
Cat Dander IgE: <0.1 kU/L
Cedar, Mountain IgE: 0.11 kU/L — AB
Cladosporium Herbarum IgE: <0.1 kU/L
Cockroach, German IgE: <0.1 kU/L
Common Silver Birch IgE: <0.1 kU/L
Cottonwood IgE: 0.13 kU/L — AB
D Farinae IgE: <0.1 kU/L
D Pteronyssinus IgE: <0.1 kU/L
Dog Dander IgE: 0.2 kU/L — AB
Elm, American IgE: 0.16 kU/L — AB
IgE (Immunoglobulin E), Serum: 62 IU/mL (ref 6–495)
Johnson Grass IgE: 5.3 kU/L — AB
Maple/Box Elder IgE: <0.1 kU/L
Mouse Urine IgE: <0.1 kU/L
Oak, White IgE: 0.14 kU/L — AB
Pecan, Hickory IgE: 0.26 kU/L — AB
Penicillium Chrysogen IgE: <0.1 kU/L
Pigweed, Rough IgE: <0.1 kU/L
Ragweed, Short IgE: 0.13 kU/L — AB
Sheep Sorrel IgE Qn: 0.16 kU/L — AB
Timothy Grass IgE: 13.8 kU/L — AB
White Mulberry IgE: <0.1 kU/L

## 2020-05-10 LAB — IGG, IGA, IGM
IgA/Immunoglobulin A, Serum: 157 mg/dL (ref 87–352)
IgG (Immunoglobin G), Serum: 893 mg/dL (ref 586–1602)
IgM (Immunoglobulin M), Srm: 103 mg/dL (ref 26–217)

## 2020-05-10 LAB — DIPHTHERIA / TETANUS ANTIBODY PANEL
Diphtheria Ab: 0.3 IU/mL (ref <0.10)
Tetanus Ab, IgG: 0.21 IU/mL (ref <0.10)

## 2020-05-18 ENCOUNTER — Other Ambulatory Visit: Payer: Self-pay | Admitting: Physician Assistant

## 2020-06-02 ENCOUNTER — Telehealth: Payer: Self-pay | Admitting: Allergy & Immunology

## 2020-06-02 DIAGNOSIS — Z20822 Contact with and (suspected) exposure to covid-19: Secondary | ICD-10-CM

## 2020-06-02 NOTE — Telephone Encounter (Signed)
Patient is ill and has a couple of recent [place] exposures. Ordered swab.  Salvatore Marvel, MD [facility] [place] [facility] [place]

## 2020-06-03 LAB — SARS-COV-2, NAA 2 DAY TAT

## 2020-06-03 LAB — NOVEL CORONAVIRUS, NAA: SARS-CoV-2, NAA: NOT DETECTED

## 2020-06-04 ENCOUNTER — Telehealth: Payer: 59 | Admitting: Family

## 2020-06-04 DIAGNOSIS — R399 Unspecified symptoms and signs involving the genitourinary system: Secondary | ICD-10-CM | POA: Diagnosis not present

## 2020-06-04 MED ORDER — CEPHALEXIN 500 MG PO CAPS: 500.0000 mg | ORAL_CAPSULE | Freq: Two times a day (BID) | ORAL | 0 refills | Status: DC

## 2020-06-04 NOTE — Progress Notes (Signed)

## 2020-06-16 DIAGNOSIS — H1012 Acute atopic conjunctivitis, left eye: Secondary | ICD-10-CM | POA: Diagnosis not present

## 2020-06-19 ENCOUNTER — Ambulatory Visit: Payer: BC Managed Care – PPO | Admitting: Allergy
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
## 2020-07-09 DIAGNOSIS — R070 Pain in throat: Secondary | ICD-10-CM | POA: Diagnosis not present

## 2020-07-09 DIAGNOSIS — H9203 Otalgia, bilateral: Secondary | ICD-10-CM | POA: Diagnosis not present

## 2020-07-09 DIAGNOSIS — R05 Cough: Secondary | ICD-10-CM | POA: Diagnosis not present

## 2020-07-09 DIAGNOSIS — Z20828 Contact with and (suspected) exposure to other viral communicable diseases: Secondary | ICD-10-CM | POA: Diagnosis not present

## 2020-07-09 DIAGNOSIS — R519 Headache, unspecified: Secondary | ICD-10-CM | POA: Diagnosis not present

## 2020-07-14 ENCOUNTER — Other Ambulatory Visit: Payer: Self-pay | Admitting: Physician Assistant

## 2020-07-27 ENCOUNTER — Other Ambulatory Visit: Payer: Self-pay | Admitting: Physician Assistant

## 2020-08-05 ENCOUNTER — Other Ambulatory Visit: Payer: Self-pay | Admitting: Physician Assistant

## 2020-08-05 DIAGNOSIS — F419 Anxiety disorder, unspecified: Secondary | ICD-10-CM

## 2020-08-07 ENCOUNTER — Telehealth (INDEPENDENT_AMBULATORY_CARE_PROVIDER_SITE_OTHER): Payer: 59 | Admitting: Physician Assistant

## 2020-08-07 ENCOUNTER — Other Ambulatory Visit: Payer: Self-pay | Admitting: Physician Assistant

## 2020-08-07 ENCOUNTER — Encounter: Payer: Self-pay | Admitting: Physician Assistant

## 2020-08-07 DIAGNOSIS — F419 Anxiety disorder, unspecified: Secondary | ICD-10-CM

## 2020-08-07 DIAGNOSIS — Z638 Other specified problems related to primary support group: Secondary | ICD-10-CM | POA: Insufficient documentation

## 2020-08-07 DIAGNOSIS — R4589 Other symptoms and signs involving emotional state: Secondary | ICD-10-CM | POA: Insufficient documentation

## 2020-08-07 DIAGNOSIS — F410 Panic disorder [episodic paroxysmal anxiety] without agoraphobia: Secondary | ICD-10-CM | POA: Diagnosis not present

## 2020-08-07 MED ORDER — BUPROPION HCL ER (XL) 150 MG PO TB24: 150.0000 mg | ORAL_TABLET | ORAL | 0 refills | Status: DC

## 2020-08-07 MED ORDER — ESCITALOPRAM OXALATE 10 MG PO TABS: ORAL_TABLET | ORAL | 0 refills | Status: DC

## 2020-08-07 MED FILL — buPROPion HCL ER (XL) 150 M: 150 | 90 days supply | Qty: 90 | Fill #0

## 2020-08-07 MED FILL — ESCITALOPRAM 10 MG TABLET: 10 | 90 days supply | Qty: 135 | Fill #0

## 2020-08-07 NOTE — Progress Notes (Signed)
Patient ID: [name] Fuentes, female   DOB: February 26, 1991, 29 y.o.   MRN: [id number] .Marland KitchenVirtual Visit via Telephone Note  I connected with [name] Fuentes on 08/07/20 at  1:00 PM EDT by telephone and verified that I am speaking with the correct person using two identifiers.  Location: Patient: home Provider: clinic   I discussed the limitations, risks, security and privacy concerns of performing an evaluation and management service by telephone and the availability of in person appointments. I also discussed with the patient that there may be a patient responsible charge related to this service. The patient expressed understanding and agreed to proceed.   History of Present Illness: Pt is a 29 yo female anxiety, Depression, panic attacks who presents to the clinic to discuss mood.  Patient is going through a lot currently.  She is in the middle of a separation.  Her and her 5 biological and foster children had Covid and they have been quarantine for the last month.  She is having to make a decision about her to foster boys and their return to [employer].  She is just overwhelmed.  She has not been working currently and not getting paid.  She did have to take an Atarax the other day and it seemed to help her anxiety and panic.  She does have a history of ADD but does not like the way the medications make her feel.  She is on Lexapro and it helped significantly when she first started.  Her situation seems to have worsened.  She has made a call for counseling but not able to get in for a few weeks.  She denies any suicidal or homicidal thoughts.   .. Active Ambulatory Problems    Diagnosis Date Noted  . Psoriasis 03/12/2017  . PCOS (polycystic ovarian syndrome) 03/12/2017  . Hypertension goal BP (blood pressure) < 130/80 03/12/2017  . Elevated blood pressure reading 03/12/2017  . Morbid obesity ([place]) 03/12/2017  . Gastroesophageal reflux disease without esophagitis 03/12/2017  . Elevated TSH 04/27/2018   . Body aches 04/27/2018  . Elevated ALT measurement 04/28/2018  . 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 Otalgia, right 10/18/2018  . Anxiety 09/20/2019  . Panic attacks 09/20/2019  . History of frequent upper respiratory infection 05/03/2020  . Chronic rhinitis 05/03/2020  . Adverse food reaction 05/03/2020  . Shortness of breath 05/03/2020  . Allergic reaction 05/04/2020  . Drug reaction 05/04/2020  . Heartburn 05/04/2020  . Depressed mood 08/07/2020  . Stress due to family tension 08/07/2020   Resolved Ambulatory Problems    Diagnosis Date Noted  . Morbid obesity with BMI of 60.0-69.9, adult ([place]) 09/28/2018   Past Medical History:  Diagnosis Date  . Allergy to alpha-gal   . Dyspnea   . GERD (gastroesophageal reflux disease)   . History of kidney stones 10/28/2019  . Hydradenitis   . Hypertension   . Obese    Reviewed med, allergy, problem list.   Observations/Objective: No acute distress Anxious mood.   .. Active Ambulatory Problems    Diagnosis Date Noted  . Psoriasis 03/12/2017  . PCOS (polycystic ovarian syndrome) 03/12/2017  . Hypertension goal BP (blood pressure) < 130/80 03/12/2017  . Elevated blood pressure reading 03/12/2017  . Morbid obesity ([place]) 03/12/2017  . Gastroesophageal reflux disease without esophagitis 03/12/2017  . Elevated TSH 04/27/2018  . Body aches 04/27/2018  . Elevated ALT measurement 04/28/2018  . Status post laparoscopic sleeve gastrectomy  10/14/2018  . 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 Otalgia, right 10/18/2018  . Anxiety 09/20/2019  . Panic attacks 09/20/2019  . History of frequent upper respiratory infection 05/03/2020  . Chronic rhinitis 05/03/2020  . Adverse food reaction 05/03/2020  . Shortness of breath 05/03/2020  . Allergic reaction 05/04/2020  . Drug reaction 05/04/2020  . Heartburn 05/04/2020  . Depressed mood 08/07/2020  . Stress due to  family tension 08/07/2020   Resolved Ambulatory Problems    Diagnosis Date Noted  . Morbid obesity with BMI of 60.0-69.9, adult ([place]) 09/28/2018   Past Medical History:  Diagnosis Date  . Allergy to alpha-gal   . Dyspnea   . GERD (gastroesophageal reflux disease)   . History of kidney stones 10/28/2019  . Hydradenitis   . Hypertension   . Obese       Assessment and Plan: Marland KitchenMarland KitchenMadison was seen today for anxiety.  Diagnoses and all orders for this visit:  Panic attacks -     escitalopram (LEXAPRO) 10 MG tablet; Take 1 and 1/2 tablet daily -     buPROPion (WELLBUTRIN XL) 150 MG 24 hr tablet; Take 1 tablet (150 mg total) by mouth every morning.  Anxiety -     escitalopram (LEXAPRO) 10 MG tablet; Take 1 and 1/2 tablet daily -     buPROPion (WELLBUTRIN XL) 150 MG 24 hr tablet; Take 1 tablet (150 mg total) by mouth every morning.  Depressed mood -     escitalopram (LEXAPRO) 10 MG tablet; Take 1 and 1/2 tablet daily -     buPROPion (WELLBUTRIN XL) 150 MG 24 hr tablet; Take 1 tablet (150 mg total) by mouth every morning.  Stress due to family tension -     escitalopram (LEXAPRO) 10 MG tablet; Take 1 and 1/2 tablet daily -     buPROPion (WELLBUTRIN XL) 150 MG 24 hr tablet; Take 1 tablet (150 mg total) by mouth every morning.   Patient is not doing well with her anxiety and depression.  Lexapro was increased to 1-1/2 tablets to equal 15 mg.  Also added Wellbutrin in the morning.  I think this could help some with her focus as well as her overall mood. Discussed to stop wellbutrin if worsening anxiety. Ok to use atarax as needed for acute anxiety. I do think counseling would be a great idea.  Follow up in 4-6 weeks.    Follow Up Instructions:    I discussed the assessment and treatment plan with the patient. The patient was provided an opportunity to ask questions and all were answered. The patient agreed with the plan and demonstrated an understanding of the instructions.   The  patient was advised to call back or seek an in-person evaluation if the symptoms worsen or if the condition fails to improve as anticipated.  I provided 20 minutes of non-face-to-face time during this encounter.   Iran Planas, PA-C

## 2020-08-07 NOTE — Progress Notes (Deleted)
Refill, nolonger has [employer], switching to [facility], LEXAPRO 10 mg, up to 15 with  Wellbutrin   Anxiety attacy yesterday and the day before  aterax helped with ADD and anxiety.

## 2020-08-15 ENCOUNTER — Other Ambulatory Visit: Payer: Self-pay | Admitting: Physician Assistant

## 2020-08-15 DIAGNOSIS — F419 Anxiety disorder, unspecified: Secondary | ICD-10-CM

## 2020-09-04 ENCOUNTER — Ambulatory Visit: Payer: 59 | Admitting: Physician Assistant

## 2020-09-05 ENCOUNTER — Encounter: Payer: Self-pay | Admitting: Physician Assistant

## 2020-09-05 ENCOUNTER — Ambulatory Visit (INDEPENDENT_AMBULATORY_CARE_PROVIDER_SITE_OTHER): Payer: 59 | Admitting: Physician Assistant

## 2020-09-05 ENCOUNTER — Other Ambulatory Visit: Payer: Self-pay | Admitting: Physician Assistant

## 2020-09-05 VITALS — BP 128/76 | HR 81 | Ht 62.0 in | Wt 282.0 lb

## 2020-09-05 DIAGNOSIS — B9689 Other specified bacterial agents as the cause of diseases classified elsewhere: Secondary | ICD-10-CM | POA: Diagnosis not present

## 2020-09-05 DIAGNOSIS — Z638 Other specified problems related to primary support group: Secondary | ICD-10-CM

## 2020-09-05 DIAGNOSIS — Z9884 Bariatric surgery status: Secondary | ICD-10-CM | POA: Diagnosis not present

## 2020-09-05 DIAGNOSIS — K219 Gastro-esophageal reflux disease without esophagitis: Secondary | ICD-10-CM

## 2020-09-05 DIAGNOSIS — F419 Anxiety disorder, unspecified: Secondary | ICD-10-CM

## 2020-09-05 DIAGNOSIS — J019 Acute sinusitis, unspecified: Secondary | ICD-10-CM

## 2020-09-05 DIAGNOSIS — R4589 Other symptoms and signs involving emotional state: Secondary | ICD-10-CM | POA: Diagnosis not present

## 2020-09-05 MED ORDER — BUSPIRONE HCL 7.5 MG PO TABS: 7.5000 mg | ORAL_TABLET | Freq: Three times a day (TID) | ORAL | 1 refills | Status: DC

## 2020-09-05 MED ORDER — ESCITALOPRAM OXALATE 20 MG PO TABS: 20.0000 mg | ORAL_TABLET | Freq: Every day | ORAL | 1 refills | Status: DC

## 2020-09-05 MED ORDER — AMOXICILLIN-POT CLAVULANATE 875-125 MG PO TABS: 1.0000 | ORAL_TABLET | Freq: Two times a day (BID) | ORAL | 0 refills | Status: DC

## 2020-09-05 MED ORDER — PANTOPRAZOLE SODIUM 40 MG PO TBEC: 40.0000 mg | DELAYED_RELEASE_TABLET | Freq: Every day | ORAL | 3 refills | Status: DC

## 2020-09-05 MED FILL — PANTOPRAZOLE SOD DR 40 MG T: 40 | 90 days supply | Qty: 90 | Fill #0 | Status: TO

## 2020-09-05 MED FILL — AMOX-CLAV 875-125 MG TABLET: 875-125 | 10 days supply | Qty: 20 | Fill #0 | Status: TO

## 2020-09-05 MED FILL — PANTOPRAZOLE SOD DR 40 MG T: 40 | 90 days supply | Qty: 90 | Fill #0

## 2020-09-05 MED FILL — BUSPIRONE HCL 7.5 MG TABS: 7.5 | 30 days supply | Qty: 90 | Fill #0 | Status: TO

## 2020-09-05 MED FILL — ESCITALOPRAM 20 MG TABLET: 20 | 30 days supply | Qty: 30 | Fill #0

## 2020-09-05 MED FILL — ESCITALOPRAM 20 MG TABLET: 20 | 30 days supply | Qty: 30 | Fill #0 | Status: TO

## 2020-09-05 MED FILL — AMOX-CLAV 875-125 MG TABLET: 875-125 | 10 days supply | Qty: 20 | Fill #0

## 2020-09-05 MED FILL — busPIRone HCL 7.5 MG TABS: 7.5 | 30 days supply | Qty: 90 | Fill #0

## 2020-09-05 NOTE — Patient Instructions (Signed)

## 2020-09-05 NOTE — Progress Notes (Signed)
Subjective:    Patient ID: [name] Fuentes, female    DOB: 06-19-1991, 29 y.o.   MRN: [id number]  HPI  Pt is a 29 yo obese female with depressed mood, anxiety, worsening GERD and under a lot of stress who presents to the clinic for follow up.   She is taking lexapro and wellbutrin. wellbutrin has made her more irritable. She has moved out of her house and separated from husband. She has scheduled counseling but needs to make time for it. No SI/HC. She is taking another job working from home and hoping that will help with stress. She has 3 girls at home. She is sleeping well.   Her acid reflux is worsening. She is taking omeprazole. She feels like weight gain is not helping. Her food choices are not helping either.   Sinus pressure/congestion URI symptoms for at least 2 weeks. Tried OtC sinus meds with little relief. No fever, chills, body aches.   .. Active Ambulatory Problems    Diagnosis Date Noted  . Psoriasis 03/12/2017  . PCOS (polycystic ovarian syndrome) 03/12/2017  . Hypertension goal BP (blood pressure) < 130/80 03/12/2017  . Elevated blood pressure reading 03/12/2017  . Morbid obesity ([place]) 03/12/2017  . Gastroesophageal reflux disease without esophagitis 03/12/2017  . Elevated TSH 04/27/2018  . Body aches 04/27/2018  . Elevated ALT measurement 04/28/2018  . 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 Otalgia, right 10/18/2018  . Anxiety 09/20/2019  . Panic attacks 09/20/2019  . History of frequent upper respiratory infection 05/03/2020  . Chronic rhinitis 05/03/2020  . Adverse food reaction 05/03/2020  . Shortness of breath 05/03/2020  . Allergic reaction 05/04/2020  . Drug reaction 05/04/2020  . Heartburn 05/04/2020  . Depressed mood 08/07/2020  . Stress due to family tension 08/07/2020   Resolved Ambulatory Problems    Diagnosis Date Noted  . Morbid obesity with BMI of 60.0-69.9, adult ([place])  09/28/2018   Past Medical History:  Diagnosis Date  . Allergy to alpha-gal   . Dyspnea   . GERD (gastroesophageal reflux disease)   . History of kidney stones 10/28/2019  . Hydradenitis   . Hypertension   . Obese      Review of Systems  All other systems reviewed and are negative.      Objective:   Physical Exam Vitals reviewed.  Constitutional:      Appearance: Normal appearance. She is obese.  HENT:     Head: Normocephalic.     Right Ear: Tympanic membrane normal.     Left Ear: Tympanic membrane normal.     Ears:     Comments: Maxillary sinus pressure to palpation    Nose: Congestion present.     Mouth/Throat:     Mouth: Mucous membranes are moist.  Eyes:     Extraocular Movements: Extraocular movements intact.     Conjunctiva/sclera: Conjunctivae normal.     Pupils: Pupils are equal, round, and reactive to light.  Cardiovascular:     Rate and Rhythm: Normal rate and regular rhythm.     Pulses: Normal pulses.     Heart sounds: Normal heart sounds.  Pulmonary:     Effort: Pulmonary effort is normal.  Neurological:     General: No focal deficit present.     Mental Status: She is alert and oriented to person, place, and time.  Psychiatric:        Mood and Affect: Mood normal.      .Marland Kitchen  Depression screen [facility] 2/9 09/05/2020 09/17/2019 05/03/2019 10/14/2018 03/12/2017  Decreased Interest 0 0 0 0 0  Down, Depressed, Hopeless 0 0 0 0 0  PHQ - 2 Score 0 0 0 0 0  Altered sleeping 1 1 - - -  Tired, decreased energy 1 0 - - -  Change in appetite 0 0 - - -  Feeling bad or failure about yourself  0 0 - - -  Trouble concentrating 1 0 - - -  Moving slowly or fidgety/restless 3 0 - - -  Suicidal thoughts 0 0 - - -  PHQ-9 Score 6 1 - - -  Difficult doing work/chores Not difficult at all Not difficult at all - - -   .Marland Kitchen GAD 7 : Generalized Anxiety Score 09/05/2020 09/17/2019  Nervous, Anxious, on Edge 3 3  Control/stop worrying 3 3  Worry too much - different things 3 3   Trouble relaxing 3 3  Restless 3 3  Easily annoyed or irritable 3 3  Afraid - awful might happen 3 3  Total GAD 7 Score 21 21  Anxiety Difficulty Very difficult Somewhat difficult         Assessment & Plan:  Marland KitchenMarland KitchenMadison was seen today for anxiety and depression.  Diagnoses and all orders for this visit:  Stress due to family tension -     escitalopram (LEXAPRO) 20 MG tablet; Take 1 tablet (20 mg total) by mouth daily. -     busPIRone (BUSPAR) 7.5 MG tablet; Take 1 tablet (7.5 mg total) by mouth 3 (three) times daily.  Gastroesophageal reflux disease without esophagitis -     pantoprazole (PROTONIX) 40 MG tablet; Take 1 tablet (40 mg total) by mouth daily.  Status post laparoscopic sleeve gastrectomy  Depressed mood -     escitalopram (LEXAPRO) 20 MG tablet; Take 1 tablet (20 mg total) by mouth daily.  Anxiety -     escitalopram (LEXAPRO) 20 MG tablet; Take 1 tablet (20 mg total) by mouth daily. -     busPIRone (BUSPAR) 7.5 MG tablet; Take 1 tablet (7.5 mg total) by mouth 3 (three) times daily.  Acute bacterial sinusitis -     amoxicillin-clavulanate (AUGMENTIN) 875-125 MG tablet; Take 1 tablet by mouth 2 (two) times daily for 10 days.   PHQ/GAD not to goal.  Increased lexapro. Added buspar.  Stop wellbutrin.  Follow up in 2 months.  Encouraged counseling.   protonix for reflux. Discussed food choices.   augmentin for sinusitis. Ok to use flonase. Follow up as needed.

## 2020-09-19 ENCOUNTER — Other Ambulatory Visit: Payer: Self-pay | Admitting: Physician Assistant

## 2020-09-19 DIAGNOSIS — F419 Anxiety disorder, unspecified: Secondary | ICD-10-CM

## 2020-09-20 ENCOUNTER — Other Ambulatory Visit: Payer: Self-pay | Admitting: Physician Assistant

## 2020-09-20 ENCOUNTER — Telehealth: Payer: 59 | Admitting: Nurse Practitioner

## 2020-09-20 DIAGNOSIS — J020 Streptococcal pharyngitis: Secondary | ICD-10-CM

## 2020-09-20 MED ORDER — AMOXICILLIN 500 MG PO CAPS
500.0000 mg | ORAL_CAPSULE | Freq: Two times a day (BID) | ORAL | 0 refills | Status: DC
Start: 2020-09-20 — End: 2020-10-27

## 2020-09-20 MED ORDER — HYDROXYZINE HCL 25 MG PO TABS: 25.0000 mg | ORAL_TABLET | Freq: Four times a day (QID) | ORAL | 2 refills | Status: DC | PRN

## 2020-09-20 MED FILL — hydrOXYzine HCL 25 MG TABS: 25 | 15 days supply | Qty: 60 | Fill #0

## 2020-09-20 NOTE — Progress Notes (Signed)
We are sorry that you are not feeling well.  Here is how we plan to help!  Based on what you have shared with me it is likely that you have strep pharyngitis.  Strep pharyngitis is inflammation and infection in the back of the throat.  This is an infection cause by bacteria and is treated with antibiotics.  I have prescribed Amoxicillin 500 mg twice a day for 10 days. For throat pain, we recommend over the counter oral pain relief medications such as acetaminophen or aspirin, or anti-inflammatory medications such as ibuprofen or naproxen sodium. Topical treatments such as oral throat lozenges or sprays may be used as needed. Strep infections are not as easily transmitted as other respiratory infections, however we still recommend that you avoid close contact with loved ones, especially the very young and elderly.  Remember to wash your hands thoroughly throughout the day as this is the number one way to prevent the spread of infection and wipe down door knobs and counters with disinfectant.   Home Care:  Only take medications as instructed by your medical team.  Complete the entire course of an antibiotic.  Do not take these medications with alcohol.  A steam or ultrasonic humidifier can help congestion.  You can place a towel over your head and breathe in the steam from hot water coming from a faucet.  Avoid close contacts especially the very young and the elderly.  Cover your mouth when you cough or sneeze.  Always remember to wash your hands.  Get Help Right Away If:  You develop worsening fever or sinus pain.  You develop a severe head ache or visual changes.  Your symptoms persist after you have completed your treatment plan.  Make sure you  Understand these instructions.  Will watch your condition.  Will get help right away if you are not doing well or get worse.  Your e-visit answers were reviewed by a board certified advanced clinical practitioner to complete your  personal care plan.  Depending on the condition, your plan could have included both over the counter or prescription medications.  If there is a problem please reply  once you have received a response from your provider.  Your safety is important to [place].  If you have drug allergies check your prescription carefully.    You can use MyChart to ask questions about today's visit, request a non-urgent call back, or ask for a work or school excuse for 24 hours related to this e-Visit. If it has been greater than 24 hours you will need to follow up with your provider, or enter a new e-Visit to address those concerns.  You will get an e-mail in the next two days asking about your experience.  I hope that your e-visit has been valuable and will speed your recovery. Thank you for using e-visits.  5-10 minutes spent reviewing and documenting in chart.

## 2020-09-20 NOTE — Telephone Encounter (Signed)
It should help her to use less of this but she can still have as needed.

## 2020-09-20 NOTE — Telephone Encounter (Signed)
You added Buspar last visit, I think that was supposed to take the place of this medication, is that correct?

## 2020-10-27 ENCOUNTER — Encounter (HOSPITAL_COMMUNITY): Payer: Self-pay | Admitting: Emergency Medicine

## 2020-10-27 ENCOUNTER — Other Ambulatory Visit: Payer: Self-pay

## 2020-10-27 ENCOUNTER — Emergency Department (HOSPITAL_COMMUNITY): Payer: 59

## 2020-10-27 ENCOUNTER — Emergency Department (HOSPITAL_COMMUNITY)
Admission: EM | Admit: 2020-10-27 | Discharge: 2020-10-27 | Disposition: A | Discharge status: Home / Self Care | Payer: 59 | Attending: Emergency Medicine | Admitting: Emergency Medicine

## 2020-10-27 ENCOUNTER — Telehealth: Payer: 59 | Admitting: Physician Assistant

## 2020-10-27 DIAGNOSIS — I10 Essential (primary) hypertension: Secondary | ICD-10-CM | POA: Diagnosis not present

## 2020-10-27 DIAGNOSIS — L0211 Cutaneous abscess of neck: Secondary | ICD-10-CM | POA: Diagnosis not present

## 2020-10-27 DIAGNOSIS — R590 Localized enlarged lymph nodes: Secondary | ICD-10-CM | POA: Insufficient documentation

## 2020-10-27 DIAGNOSIS — R221 Localized swelling, mass and lump, neck: Secondary | ICD-10-CM

## 2020-10-27 DIAGNOSIS — Z20822 Contact with and (suspected) exposure to covid-19: Secondary | ICD-10-CM | POA: Insufficient documentation

## 2020-10-27 DIAGNOSIS — R591 Generalized enlarged lymph nodes: Secondary | ICD-10-CM

## 2020-10-27 DIAGNOSIS — R1310 Dysphagia, unspecified: Secondary | ICD-10-CM | POA: Diagnosis not present

## 2020-10-27 DIAGNOSIS — J351 Hypertrophy of tonsils: Secondary | ICD-10-CM | POA: Diagnosis not present

## 2020-10-27 LAB — CBC
HCT: 40.3 % (ref 36.0–46.0)
Hemoglobin: 13.4 g/dL (ref 12.0–15.0)
MCH: 30.4 pg (ref 26.0–34.0)
MCHC: 33.3 g/dL (ref 30.0–36.0)
MCV: 91.4 fL (ref 80.0–100.0)
Platelets: 276 10*3/uL (ref 150–400)
RBC: 4.41 MIL/uL (ref 3.87–5.11)
RDW: 13.2 % (ref 11.5–15.5)
WBC: 10.1 10*3/uL (ref 4.0–10.5)
nRBC: 0 % (ref 0.0–0.2)

## 2020-10-27 LAB — BASIC METABOLIC PANEL
Anion gap: 7 (ref 5–15)
BUN: 9 mg/dL (ref 6–20)
CO2: 25 mmol/L (ref 22–32)
Calcium: 8.9 mg/dL (ref 8.9–10.3)
Chloride: 107 mmol/L (ref 98–111)
Creatinine, Ser: 0.69 mg/dL (ref 0.44–1.00)
GFR, Estimated: >60 mL/min (ref >60)
Glucose, Bld: 88 mg/dL (ref 70–99)
Potassium: 4.4 mmol/L (ref 3.5–5.1)
Sodium: 139 mmol/L (ref 135–145)

## 2020-10-27 LAB — GROUP A STREP BY PCR: Group A Strep by PCR: NOT DETECTED

## 2020-10-27 LAB — RESP PANEL BY RT-PCR (FLU A&B, COVID) ARPGX2
Influenza A by PCR: NEGATIVE
Influenza B by PCR: NEGATIVE
SARS Coronavirus 2 by RT PCR: NEGATIVE

## 2020-10-27 LAB — MONONUCLEOSIS SCREEN: Mono Screen: NEGATIVE

## 2020-10-27 LAB — I-STAT BETA HCG BLOOD, ED (MC, WL, AP ONLY): I-stat hCG, quantitative: <5 m[IU]/mL (ref <5)

## 2020-10-27 MED ORDER — IOHEXOL 300 MG/ML  SOLN
75.0000 mL | Freq: Once | INTRAMUSCULAR | Status: AC | PRN
  Administered 2020-10-27: 75 mL via INTRAVENOUS

## 2020-10-27 NOTE — ED Provider Notes (Signed)
[facility] EMERGENCY DEPARTMENT Provider Note   CSN: [id number] Arrival date & time: 10/27/20  1309     History Chief Complaint  Patient presents with  . Lymphadenopathy    [name] DOROUGH is a 29 y.o. female.  HPI 29 year old female with a history of GERD, hydradenitis, hypertension, obesity presents to the ER with complaints of swollen lymph node to the right side of her neck x3 days.  Endorses some fevers as high as 102.  Has some pain on swallowing, but no drooling, tripoding, shortness of breath.  She states that she has a lymph node on the back of her neck that has been swollen for the last 2 months, however this is more lateral on the right side.  She was seen via televisit today and was sent here with concerns of a possible abscess.  She reports that she was treated for strep approximately a month ago.  She states she is mainly here to rule out any neck abscess.    Past Medical History:  Diagnosis Date  . Allergy to alpha-gal   . Dyspnea    on exertion   . GERD (gastroesophageal reflux disease)   . History of kidney stones 10/28/2019   passed   . Hydradenitis   . Hypertension    no longer on medication  . Obese   . PCOS (polycystic ovarian syndrome)   . Psoriasis     Patient Active Problem List   Diagnosis Date Noted  . Depressed mood 08/07/2020  . Stress due to family tension 08/07/2020  . Allergic reaction 05/04/2020  . Drug reaction 05/04/2020  . Heartburn 05/04/2020  . History of frequent upper respiratory infection 05/03/2020  . Chronic rhinitis 05/03/2020  . Adverse food reaction 05/03/2020  . Shortness of breath 05/03/2020  . Anxiety 09/20/2019  . Panic attacks 09/20/2019  . 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 Otalgia, right 10/18/2018  . 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 Elevated ALT measurement 04/28/2018  . Elevated TSH 04/27/2018  . Body aches 04/27/2018  . Psoriasis 03/12/2017  . PCOS  (polycystic ovarian syndrome) 03/12/2017  . Hypertension goal BP (blood pressure) < 130/80 03/12/2017  . Elevated blood pressure reading 03/12/2017  . Morbid obesity ([place]) 03/12/2017  . Gastroesophageal reflux disease without esophagitis 03/12/2017    Past Surgical History:  Procedure Laterality Date  . ANKLE RECONSTRUCTION Left    L ankle; plates and screws in place   . CHOLECYSTECTOMY N/A 12/01/2018   Procedure: LAPAROSCOPIC CHOLECYSTECTOMY WITH INTRAOPERATIVE CHOLANGIOGRAM;  Surgeon: Excell Seltzer, MD;  Location: [facility];  Service: General;  Laterality: N/A;  . LAPAROSCOPIC GASTRIC SLEEVE RESECTION N/A 09/28/2018   Procedure: LAPAROSCOPIC GASTRIC SLEEVE RESECTION, UPPER ENDO, ERAS PATHWAY;  Surgeon: Excell Seltzer, MD;  Location: [facility];  Service: General;  Laterality: N/A;  . [place] Plasty    . TONSILLECTOMY    . TYMPANOSTOMY TUBE PLACEMENT     several sets  . WISDOM TOOTH EXTRACTION       OB History   No obstetric history on file.     Family History  Problem Relation Age of Onset  . Hypertension Mother   . Hypertension Father   . Hypertension Brother   . Hypertension Maternal Grandfather   . Cancer Paternal Grandfather        liver  . Stroke Paternal Grandfather   . Allergic rhinitis Neg Hx   . Angioedema Neg Hx   . Asthma Neg Hx   . Eczema  Neg Hx   . Immunodeficiency Neg Hx   . Urticaria Neg Hx     Social History   Tobacco Use  . Smoking status: Never Smoker  . Smokeless tobacco: Never Used  Vaping Use  . Vaping Use: Never used  Substance Use Topics  . Alcohol use: Yes    Comment: occ  . Drug use: No    Home Medications Prior to Admission medications   Medication Sig Start Date End Date Taking? Authorizing Provider  acetaminophen (TYLENOL) 500 MG tablet Take 1,000 mg by mouth every 6 (six) hours as needed for headache (pain).   Yes [provider]  albuterol (VENTOLIN HFA) 108 (90 Base) MCG/ACT inhaler Inhale 2 puffs into the lungs  every 4 (four) hours as needed for wheezing or shortness of breath (coughing fit). 05/03/20  Yes Garnet Sierras, DO  Ascorbic Acid (VITAMIN C PO) Take 1 tablet by mouth daily.   Yes [provider]  buPROPion (WELLBUTRIN XL) 150 MG 24 hr tablet Take 1 tablet (150 mg total) by mouth every morning. Patient taking differently: Take 150 mg by mouth daily. 08/07/20  Yes Breeback, Jade L, PA-C  busPIRone (BUSPAR) 7.5 MG tablet Take 1 tablet (7.5 mg total) by mouth 3 (three) times daily. Patient taking differently: Take 7.5 mg by mouth 3 (three) times daily as needed (anxiety). 09/05/20  Yes Breeback, Jade L, PA-C  clobetasol cream (TEMOVATE) [id number] % Apply 1 application topically 2 (two) times daily. Patient taking differently: Apply 1 application topically 2 (two) times daily as needed (psoriasis). 03/12/17  Yes Breeback, Jade L, PA-C  Cyanocobalamin (VITAMIN B-12 PO) Take 1 tablet by mouth daily.   Yes [provider]  ELDERBERRY PO Take 1 tablet by mouth daily.   Yes [provider]  EPINEPHrine (AUVI-Q) 0.3 mg/0.3 mL IJ SOAJ injection Inject 0.3 mLs (0.3 mg total) into the muscle as needed for anaphylaxis. Patient taking differently: Inject 0.3 mg into the muscle once as needed for anaphylaxis. 05/03/20  Yes Garnet Sierras, DO  escitalopram (LEXAPRO) 20 MG tablet Take 1 tablet (20 mg total) by mouth daily. Patient taking differently: Take 20 mg by mouth at bedtime. 09/05/20  Yes Breeback, Jade L, PA-C  Fexofenadine HCl (ALLEGRA PO) Take 1 tablet by mouth at bedtime as needed (allergies).   Yes [provider]  fluticasone (FLONASE) 50 MCG/ACT nasal spray Place 1 spray into both nostrils at bedtime as needed for allergies or rhinitis.   Yes [provider]  guaiFENesin (MUCINEX) 600 MG 12 hr tablet Take 600 mg by mouth 2 (two) times daily.   Yes [provider]  hydrOXYzine (ATARAX/VISTARIL) 25 MG tablet Take 1 tablet (25 mg total) by mouth every 6 (six) hours as  needed. Patient taking differently: Take 25 mg by mouth at bedtime as needed for itching. 09/20/20  Yes Breeback, Jade L, PA-C  ibuprofen (ADVIL) 200 MG tablet Take 800 mg by mouth every 6 (six) hours as needed for headache (pain).   Yes [provider]  INOSITOL PO Take 1 tablet by mouth daily.   Yes [provider]  levonorgestrel (KYLEENA) 19.5 MG IUD 1 each by Intrauterine route once.   Yes [provider]  Melatonin 10 MG TABS Take 10 mg by mouth at bedtime as needed (sleep).   Yes [provider]  Multiple Vitamin (MULTIVITAMIN WITH MINERALS) TABS tablet Take 1 tablet by mouth daily.   Yes [provider]  Multiple Vitamins-Minerals (HAIR SKIN  NAILS PO) Take 1 tablet by mouth daily.   Yes [provider]  Olopatadine HCl 0.2 % SOLN Apply 1 drop to eye daily as needed (itchy/watery eyes). Patient taking differently: Apply 1 drop to eye daily as needed (itchy/watery eyes). [place] 05/03/20  Yes Garnet Sierras, DO  pantoprazole (PROTONIX) 40 MG tablet Take 1 tablet (40 mg total) by mouth daily. Patient taking differently: Take 40 mg by mouth at bedtime. 09/05/20  Yes Breeback, Jade L, PA-C  Probiotic Product (PROBIOTIC PO) Take 1 tablet by mouth daily.   Yes [provider]  Triamcinolone Acetonide (TRIAMCINOLONE 0.1 % CREAM : EUCERIN) CREA Apply 1 application topically 2 (two) times daily as needed (psoriasis).   Yes [provider]    Allergies    Other, Accutane [isotretinoin], and Metformin and related  Review of Systems   Review of Systems  Constitutional: Positive for fever. Negative for chills and fatigue.  HENT: Positive for sore throat. Negative for drooling, ear pain, facial swelling, sinus pressure, trouble swallowing and voice change.   Eyes: Negative for pain and visual disturbance.  Respiratory: Negative for cough and shortness of breath.   Cardiovascular: Negative for chest pain and palpitations.   Gastrointestinal: Negative for abdominal pain and vomiting.  Genitourinary: Negative for dysuria and hematuria.  Musculoskeletal: Positive for neck pain. Negative for arthralgias and back pain.  Skin: Negative for color change and rash.  Neurological: Negative for seizures and syncope.  All other systems reviewed and are negative.   Physical Exam Updated Vital Signs BP 116/74 (BP Location: Right Arm)   Pulse 79   Temp 98.1 F (36.7 C) (Oral)   Resp 18   Ht 5\' 2"  (1.575 m)   Wt 117.9 kg   SpO2 99%   BMI 47.55 kg/m   Physical Exam Vitals and nursing note reviewed.  Constitutional:      General: She is not in acute distress.    Appearance: She is well-developed and well-nourished. She is obese. She is not ill-appearing or diaphoretic.  HENT:     Head: Normocephalic and atraumatic.     Mouth/Throat:      Comments: Two small areas of erythema as pictured above. Erythema in right lower pocket of posterior pharynx with small vesicles. No exudates noted, uvula midline, no unilateral tonsillar swelling, tongue normal size and midline, no sublingual/submandibular swelling, tolerating secretions well     Eyes:     Conjunctiva/sclera: Conjunctivae normal.  Neck:   Cardiovascular:     Rate and Rhythm: Normal rate and regular rhythm.     Heart sounds: No murmur heard.   Pulmonary:     Effort: Pulmonary effort is normal. No respiratory distress.     Breath sounds: Normal breath sounds.  Abdominal:     General: Abdomen is flat.     Palpations: Abdomen is soft.     Tenderness: There is no abdominal tenderness.  Musculoskeletal:        General: No edema. Normal range of motion.     Cervical back: Neck supple. Tenderness present.     Right lower leg: No edema.     Left lower leg: No edema.     Comments: Large quarter size swollen lymph node to the right lateral neck just below the jawline.  No evidence of tripoding, the patient tolerating secretions well.  No voice change.  She  also has a small penny size freely mobile lymph node to the right posterior neck.  Skin:  General: Skin is warm and dry.  Neurological:     General: No focal deficit present.     Mental Status: She is alert and oriented to person, place, and time.  Psychiatric:        Mood and Affect: Mood and affect and mood normal.        Behavior: Behavior normal.     ED Results / Procedures / Treatments   Labs (all labs ordered are listed, but only abnormal results are displayed) Labs Reviewed  RESP PANEL BY RT-PCR (FLU A&B, COVID) ARPGX2  GROUP A STREP BY PCR  BASIC METABOLIC PANEL  CBC  MONONUCLEOSIS SCREEN  I-STAT BETA HCG BLOOD, ED ([facility], [facility], [facility] ONLY)    EKG None  Radiology CT Soft Tissue Neck W Contrast  Result Date: 10/27/2020 CLINICAL DATA:  Neck abscess. Enlarged lymph nodes in the right lower neck for 3 days. Difficulty swallowing. EXAM: CT NECK WITH CONTRAST TECHNIQUE: Multidetector CT imaging of the neck was performed using the standard protocol following the bolus administration of intravenous contrast. CONTRAST:  38mL OMNIPAQUE IOHEXOL 300 MG/ML  SOLN COMPARISON:  10/18/2008 FINDINGS: Pharynx and larynx: Symmetric enlargement of the lingual tonsils. No discrete mass or swelling. Patent airway. No fluid collection or inflammatory changes in the parapharyngeal or retropharyngeal spaces. Salivary glands: No inflammation, mass, or stone. Thyroid: Unremarkable. Lymph nodes: Enlarged level II a lymph nodes measure 1.3 cm in short axis on the right and 1.4 cm on the left, mildly larger than in 2009. Bilateral level III, IV, and V lymph nodes are subcentimeter in short axis. Vascular: Major vascular structures of the neck are grossly patent. Limited intracranial: Unremarkable. Visualized orbits: Unremarkable. Mastoids and visualized paranasal sinuses: Clear. Skeleton: No acute osseous abnormality or suspicious osseous lesion. Upper chest: Small ground-glass nodular opacities in the right upper  lobe. Other: None. IMPRESSION: 1. Mild bilateral level II lymphadenopathy, nonspecific but most likely reactive. 2. No abscess or other acute finding in the neck. 3. Small ground-glass nodular opacities in the right upper lobe, likely infectious/inflammatory. Electronically Signed   By: Logan Bores M.D.   On: 10/27/2020 17:32    Procedures Procedures (including critical care time)  Medications Ordered in ED Medications  iohexol (OMNIPAQUE) 300 MG/ML solution 75 mL (75 mLs Intravenous Contrast Given 10/27/20 1714)    ED Course  I have reviewed the triage vital signs and the nursing notes.  Pertinent labs & imaging results that were available during my care of the patient were reviewed by me and considered in my medical decision making (see chart for details).    MDM Rules/Calculators/A&P                          29 year old female who presents to the ER with swollen lymph node in the right neck.  Vitals reassuring, afebrile.  She does have some erythema with physical exam on the roof of her mouth and the right lower oropharynx.  Uvula midline.  Tolerating secretions well.  Swollen cervical lymph node on the right.  Mildly tender.  No excessive erythema or warmth.  DDx includes tissue abscess, RPA, reactive lymphadenopathy.  Her CBC and BMP are unremarkable.  No signs of infection.  Group A strep is negative.  Mono is negative.  Covid is negative.  Patient was also seen and evaluated by Dr. Ayesha Rumpf, and felt prudent to order a CT scan in order to rule out a deep space abscess.  CT of the  neck with bilateral lymphadenopathy, no evidence of abscess, likely reactive.  He did comment on groundglass opacities in the right upper lobe, patient states that she had Covid in September but has no infectious symptoms at this time.  No cough, nasal congestion, loss of taste or smell, nausea, vomiting.  I do not think she needs any additional imaging of the chest or treatment for pneumonia at this time.  I  encouraged the patient to continue to take Tylenol or ibuprofen for pain, encourage PCP follow-up.  Return precautions discussed.  She voiced understanding agreeable.  At this stage in the ED course, the patient is medically screened and stable for discharge. Final Clinical Impression(s) / ED Diagnoses Final diagnoses:  Lymphadenopathy    Rx / DC Orders ED Discharge Orders    None       Lyndel Safe 10/27/20 1755    Quintella Reichert, MD 10/28/20 [phone]

## 2020-10-27 NOTE — ED Notes (Signed)
Discharge instructions discussed with pt. Pt verbalized understanding. Pt stable and ambulatory. No signature pad available. 

## 2020-10-27 NOTE — ED Triage Notes (Signed)
Pt arrives to ED with c/o swollen lymph nodes to right lower side of neck x3 days. Pt states she has had swollen lymphs node to the back of her neck x2 months. Lymph node is tender and freely moveable.

## 2020-10-27 NOTE — Progress Notes (Signed)
Based on what you shared with me, I feel your condition warrants further evaluation and I recommend that you be seen for a face to face office visit. You may simply have a bacterial pharyngitis, however swelling on one side of the neck and throat is concerning of complication of an abscess.  To rule this out you will need a face to face evaluation of your throat and neck.  Please be evaluated by an urgent care or in the emergency department TODAY.   NOTE: If you entered your credit card information for this eVisit, you will not be charged. You may see a "hold" on your card for the $35 but that hold will drop off and you will not have a charge processed.   If you are having a true medical emergency please call 911.      For an urgent face to face visit, [facility] has five urgent care centers for your convenience:     [facility] [place] Get Driving Directions [phone] [address] [place], [place] [address] . 10 am - 6pm Monday - Friday    [facility]) Get Driving Directions [phone] [address] [place], [place] [address] . 10 am to 8 pm Monday-Friday . 12 pm to 8 pm [facility] Get Driving Directions [phone] [place], [place], [place] [address] . 8 am to 8 pm Monday-Friday . 9 am to 6 pm Saturday . 11 am to 6 pm Sunday     [facility] at [facility] Get Driving Directions  [phone] [address] [place],  [address] . 8 am to 8 pm Monday-Friday . 8 am to 4 pm [facility] at [place] Get Driving Directions [phone] [address] [place],  [address] . 12 pm to 6 pm Monday-Friday      Your e-visit answers were reviewed by a board certified advanced clinical practitioner to complete your personal care plan.  Thank you for  using e-Visits.   Greater than 5 minutes, yet less than 10 minutes of time have been spent researching, coordinating, and implementing care for this patient today

## 2020-10-27 NOTE — Discharge Instructions (Signed)
Your work-up today was overall reassuring.  There is no recent abscess, his CT scan did show some mild swelling in the lymph nodes in your neck.  Your mono test is negative.  Your strep test is negative.  Continue to take Tylenol or ibuprofen for your symptoms.  Please follow-up with your primary care doctor if your symptoms continue.  Return to the ER for any new or worsening symptoms.

## 2020-11-10 ENCOUNTER — Other Ambulatory Visit: Payer: Self-pay | Admitting: Physician Assistant

## 2020-11-10 ENCOUNTER — Other Ambulatory Visit: Payer: Self-pay | Admitting: *Deleted

## 2020-11-10 DIAGNOSIS — R4589 Other symptoms and signs involving emotional state: Secondary | ICD-10-CM

## 2020-11-10 DIAGNOSIS — F410 Panic disorder [episodic paroxysmal anxiety] without agoraphobia: Secondary | ICD-10-CM

## 2020-11-10 DIAGNOSIS — Z638 Other specified problems related to primary support group: Secondary | ICD-10-CM

## 2020-11-10 DIAGNOSIS — L409 Psoriasis, unspecified: Secondary | ICD-10-CM

## 2020-11-10 DIAGNOSIS — F419 Anxiety disorder, unspecified: Secondary | ICD-10-CM

## 2020-11-10 MED ORDER — CLOBETASOL PROPIONATE 0.05 % EX CREA: 1.0000 "application " | TOPICAL_CREAM | Freq: Two times a day (BID) | CUTANEOUS | 1 refills | Status: DC | PRN

## 2020-11-10 MED FILL — hydrOXYzine HCL 25 MG TABS: 25 | 15 days supply | Qty: 60 | Fill #0

## 2020-11-10 MED FILL — BUSPIRONE HCL 7.5 MG TABS: 7.5 | 30 days supply | Qty: 90 | Fill #0

## 2020-11-10 MED FILL — CLOBETASOL PROPIONATE 0.05: 0.05 | 30 days supply | Qty: 60 | Fill #0

## 2020-11-10 MED FILL — ESCITALOPRAM 20 MG TABLET: 20 | 30 days supply | Qty: 30 | Fill #0

## 2020-11-20 ENCOUNTER — Emergency Department (HOSPITAL_BASED_OUTPATIENT_CLINIC_OR_DEPARTMENT_OTHER)
Admission: EM | Admit: 2020-11-20 | Discharge: 2020-11-20 | Disposition: A | Discharge status: Home / Self Care | Payer: No Typology Code available for payment source | Attending: Emergency Medicine | Admitting: Emergency Medicine

## 2020-11-20 ENCOUNTER — Encounter (HOSPITAL_BASED_OUTPATIENT_CLINIC_OR_DEPARTMENT_OTHER): Payer: Self-pay

## 2020-11-20 ENCOUNTER — Other Ambulatory Visit: Payer: Self-pay

## 2020-11-20 ENCOUNTER — Emergency Department (HOSPITAL_BASED_OUTPATIENT_CLINIC_OR_DEPARTMENT_OTHER): Payer: No Typology Code available for payment source

## 2020-11-20 DIAGNOSIS — T1490XA Injury, unspecified, initial encounter: Secondary | ICD-10-CM

## 2020-11-20 DIAGNOSIS — W009XXA Unspecified fall due to ice and snow, initial encounter: Secondary | ICD-10-CM | POA: Insufficient documentation

## 2020-11-20 DIAGNOSIS — M25571 Pain in right ankle and joints of right foot: Secondary | ICD-10-CM | POA: Diagnosis not present

## 2020-11-20 DIAGNOSIS — S92321A Displaced fracture of second metatarsal bone, right foot, initial encounter for closed fracture: Secondary | ICD-10-CM | POA: Diagnosis not present

## 2020-11-20 DIAGNOSIS — I10 Essential (primary) hypertension: Secondary | ICD-10-CM | POA: Diagnosis not present

## 2020-11-20 DIAGNOSIS — S92324A Nondisplaced fracture of second metatarsal bone, right foot, initial encounter for closed fracture: Secondary | ICD-10-CM

## 2020-11-20 DIAGNOSIS — M7989 Other specified soft tissue disorders: Secondary | ICD-10-CM | POA: Diagnosis not present

## 2020-11-20 DIAGNOSIS — S99911A Unspecified injury of right ankle, initial encounter: Secondary | ICD-10-CM | POA: Diagnosis present

## 2020-11-20 MED ORDER — OXYCODONE-ACETAMINOPHEN 5-325 MG PO TABS
1.0000 | ORAL_TABLET | Freq: Once | ORAL | Status: AC
  Administered 2020-11-20: 1 via ORAL
  Filled 2020-11-20: qty 1

## 2020-11-20 NOTE — Discharge Instructions (Addendum)
Seen here after a fall.  X-ray shows that you have a probable proximal fracture of the second metatarsal of the right foot.  I placed you in a cam boot please keep on during the day you may take off at nighttime.  I have also given you crutches,  I like you to be nonweightbearing on that right foot.  I recommend keeping the foot elevated and taking over-the-counter pain medications like ibuprofen and Tylenol every 6 hours as needed you may apply ice to the area as this will decrease inflammation and swelling.  Please follow-up with orthopedics for further evaluation management.  Marland Kitchen

## 2020-11-20 NOTE — ED Triage Notes (Addendum)
Pt states she slipped/fell ~1 hour PTA-pain to right foot/ankle-NAD-to triage in w/c

## 2020-11-20 NOTE — ED Provider Notes (Signed)
[facility] EMERGENCY DEPARTMENT Provider Note   CSN: [id number] Arrival date & time: 11/20/20  1229     History Chief Complaint  Patient presents with  . [name] Fuentes is a 30 y.o. female.  HPI   Patient with no significant medical history presents to the emergency department with chief complaint of right ankle pain.  Patient states she was outside slipped and fell on the ice, she states she landed on her right ankle and has had severe pain in her foot.  She states she is unable to bear weight on it, states she originally had some numbness in her foot but this has since resolved.  She is able to move her toes and bend her ankle, she denies hitting her head, losing conscious, is on anticoagulant.  She denies any alleviating factors.  Patient denies headaches, fevers, chills, shortness of breath, chest pain, abdominal pain, nausea, vomiting, diarrhea.  Past Medical History:  Diagnosis Date  . Allergy to alpha-gal   . Dyspnea    on exertion   . GERD (gastroesophageal reflux disease)   . History of kidney stones 10/28/2019   passed   . Hydradenitis   . Hypertension    no longer on medication  . Obese   . PCOS (polycystic ovarian syndrome)   . Psoriasis     Patient Active Problem List   Diagnosis Date Noted  . Depressed mood 08/07/2020  . Stress due to family tension 08/07/2020  . Allergic reaction 05/04/2020  . Drug reaction 05/04/2020  . Heartburn 05/04/2020  . History of frequent upper respiratory infection 05/03/2020  . Chronic rhinitis 05/03/2020  . Adverse food reaction 05/03/2020  . Shortness of breath 05/03/2020  . Anxiety 09/20/2019  . Panic attacks 09/20/2019  . 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 Otalgia, right 10/18/2018  . 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 Elevated ALT measurement 04/28/2018  . Elevated TSH 04/27/2018  . Body aches 04/27/2018  . Psoriasis 03/12/2017  . PCOS (polycystic ovarian  syndrome) 03/12/2017  . Hypertension goal BP (blood pressure) < 130/80 03/12/2017  . Elevated blood pressure reading 03/12/2017  . Morbid obesity ([place]) 03/12/2017  . Gastroesophageal reflux disease without esophagitis 03/12/2017    Past Surgical History:  Procedure Laterality Date  . ANKLE RECONSTRUCTION Left    L ankle; plates and screws in place   . CHOLECYSTECTOMY N/A 12/01/2018   Procedure: LAPAROSCOPIC CHOLECYSTECTOMY WITH INTRAOPERATIVE CHOLANGIOGRAM;  Surgeon: Excell Seltzer, MD;  Location: [facility];  Service: General;  Laterality: N/A;  . LAPAROSCOPIC GASTRIC SLEEVE RESECTION N/A 09/28/2018   Procedure: LAPAROSCOPIC GASTRIC SLEEVE RESECTION, UPPER ENDO, ERAS PATHWAY;  Surgeon: Excell Seltzer, MD;  Location: [facility];  Service: General;  Laterality: N/A;  . [place] Plasty    . TONSILLECTOMY    . TYMPANOSTOMY TUBE PLACEMENT     several sets  . WISDOM TOOTH EXTRACTION       OB History   No obstetric history on file.     Family History  Problem Relation Age of Onset  . Hypertension Mother   . Hypertension Father   . Hypertension Brother   . Hypertension Maternal Grandfather   . Cancer Paternal Grandfather        liver  . Stroke Paternal Grandfather   . Allergic rhinitis Neg Hx   . Angioedema Neg Hx   . Asthma Neg Hx   . Eczema Neg Hx   . Immunodeficiency Neg Hx   . Urticaria  Neg Hx     Social History   Tobacco Use  . Smoking status: Never Smoker  . Smokeless tobacco: Never Used  Vaping Use  . Vaping Use: Never used  Substance Use Topics  . Alcohol use: Yes    Comment: occ  . Drug use: No    Home Medications Prior to Admission medications   Medication Sig Start Date End Date Taking? Authorizing Provider  acetaminophen (TYLENOL) 500 MG tablet Take 1,000 mg by mouth every 6 (six) hours as needed for headache (pain).    [provider]  albuterol (VENTOLIN HFA) 108 (90 Base) MCG/ACT inhaler Inhale 2 puffs into the lungs every 4 (four) hours as  needed for wheezing or shortness of breath (coughing fit). 05/03/20   Garnet Sierras, DO  Ascorbic Acid (VITAMIN C PO) Take 1 tablet by mouth daily.    [provider]  buPROPion (WELLBUTRIN XL) 150 MG 24 hr tablet Take 1 tablet (150 mg total) by mouth every morning. Patient taking differently: Take 150 mg by mouth daily. 08/07/20   Breeback, Jade L, PA-C  busPIRone (BUSPAR) 7.5 MG tablet Take 1 tablet (7.5 mg total) by mouth 3 (three) times daily. Patient taking differently: Take 7.5 mg by mouth 3 (three) times daily as needed (anxiety). 09/05/20   Breeback, [name] L, PA-C  clobetasol cream (TEMOVATE) 2.35 % Apply 1 application topically 2 (two) times daily as needed (psoriasis). 11/10/20   Breeback, Jade L, PA-C  Cyanocobalamin (VITAMIN B-12 PO) Take 1 tablet by mouth daily.    [provider]  ELDERBERRY PO Take 1 tablet by mouth daily.    [provider]  EPINEPHrine (AUVI-Q) 0.3 mg/0.3 mL IJ SOAJ injection Inject 0.3 mLs (0.3 mg total) into the muscle as needed for anaphylaxis. Patient taking differently: Inject 0.3 mg into the muscle once as needed for anaphylaxis. 05/03/20   Garnet Sierras, DO  escitalopram (LEXAPRO) 20 MG tablet Take 1 tablet (20 mg total) by mouth daily. Patient taking differently: Take 20 mg by mouth at bedtime. 09/05/20   Breeback, Royetta Car, PA-C  Fexofenadine HCl (ALLEGRA PO) Take 1 tablet by mouth at bedtime as needed (allergies).    [provider]  fluticasone (FLONASE) 50 MCG/ACT nasal spray Place 1 spray into both nostrils at bedtime as needed for allergies or rhinitis.    [provider]  guaiFENesin (MUCINEX) 600 MG 12 hr tablet Take 600 mg by mouth 2 (two) times daily.    [provider]  hydrOXYzine (ATARAX/VISTARIL) 25 MG tablet Take 1 tablet (25 mg total) by mouth every 6 (six) hours as needed. Patient taking differently: Take 25 mg by mouth at bedtime as needed for itching. 09/20/20   Breeback, Jade L, PA-C  ibuprofen  (ADVIL) 200 MG tablet Take 800 mg by mouth every 6 (six) hours as needed for headache (pain).    [provider]  INOSITOL PO Take 1 tablet by mouth daily.    [provider]  levonorgestrel (KYLEENA) 19.5 MG IUD 1 each by Intrauterine route once.    [provider]  Melatonin 10 MG TABS Take 10 mg by mouth at bedtime as needed (sleep).    [provider]  Multiple Vitamin (MULTIVITAMIN WITH MINERALS) TABS tablet Take 1 tablet by mouth daily.    [provider]  Multiple Vitamins-Minerals (HAIR SKIN NAILS PO) Take 1 tablet by mouth daily.    [provider]  Olopatadine HCl 0.2 % SOLN Apply 1 drop  to eye daily as needed (itchy/watery eyes). Patient taking differently: Apply 1 drop to eye daily as needed (itchy/watery eyes). [place] 05/03/20   Garnet Sierras, DO  pantoprazole (PROTONIX) 40 MG tablet Take 1 tablet (40 mg total) by mouth daily. Patient taking differently: Take 40 mg by mouth at bedtime. 09/05/20   Breeback, Royetta Car, PA-C  Probiotic Product (PROBIOTIC PO) Take 1 tablet by mouth daily.    [provider]  Triamcinolone Acetonide (TRIAMCINOLONE 0.1 % CREAM : EUCERIN) CREA Apply 1 application topically 2 (two) times daily as needed (psoriasis).    [provider]    Allergies    Other, Accutane [isotretinoin], and Metformin and related  Review of Systems   Review of Systems  Constitutional: Negative for chills and fever.  HENT: Negative for congestion.   Respiratory: Negative for shortness of breath.   Cardiovascular: Negative for chest pain.  Gastrointestinal: Negative for abdominal pain.  Genitourinary: Negative for enuresis.  Musculoskeletal: Negative for back pain.       Right right foot and ankle pain  Skin: Negative for rash.  Neurological: Negative for dizziness.  Hematological: Does not bruise/bleed easily.    Physical Exam Updated Vital Signs BP 135/84 (BP Location: Left Arm)   Pulse 82   Temp  98.9 F (37.2 C) (Oral)   Resp 18   Ht 5\' 2"  (1.575 m)   Wt 125.2 kg   SpO2 100%   BMI 50.48 kg/m   Physical Exam Vitals and nursing note reviewed.  Constitutional:      General: She is not in acute distress.    Appearance: Normal appearance. She is not ill-appearing or diaphoretic.  HENT:     Head: Normocephalic and atraumatic.     Nose: No congestion or rhinorrhea.  Eyes:     General: No scleral icterus.       Right eye: No discharge.        Left eye: No discharge.     Conjunctiva/sclera: Conjunctivae normal.  Cardiovascular:     Rate and Rhythm: Normal rate and regular rhythm.  Pulmonary:     Effort: Pulmonary effort is normal.  Musculoskeletal:        General: Tenderness present. No swelling or deformity.     Cervical back: Neck supple.     Right lower leg: No edema.     Left lower leg: No edema.     Comments: Patient's right foot was visualized, it was non edematous, no erythema, no lacerations, abrasions or other gross abnormalities noted.  She was able to wiggle her toes, flex, extend her ankle, bend at the knee.  She is tender to palpation along the proximal aspect of the second through fifth metatarsals, no gross deformities crepitus or other gross hemorrhages noted.  Neurovascular fully intact.  Compartments were soft.   Skin:    General: Skin is warm and dry.     Coloration: Skin is not jaundiced or pale.  Neurological:     Mental Status: She is alert and oriented to person, place, and time.  Psychiatric:        Mood and Affect: Mood normal.     ED Results / Procedures / Treatments   Labs (all labs ordered are listed, but only abnormal results are displayed) Labs Reviewed - No data to display  EKG None  Radiology DG Ankle Complete Right  Result Date: 11/20/2020 CLINICAL DATA:  RIGHT foot and ankle pain, slipped and fell 1 hour ago, injury, initial encounter EXAM:  RIGHT ANKLE - COMPLETE 3+ VIEW COMPARISON:  None FINDINGS: Diffuse soft tissue swelling.  Bone island at distal RIGHT tibial metaphysis. Osseous mineralization otherwise normal. Joint alignments grossly normal. No acute fracture, dislocation, or bone destruction. Plantar and Achilles insertion calcaneal spurring. IMPRESSION: No definite acute osseous abnormalities. Calcaneal spurring. Electronically Signed   By: Lavonia Dana M.D.   On: 11/20/2020 13:28   DG Foot Complete Right  Result Date: 11/20/2020 CLINICAL DATA:  Sudden fell 1 hour ago, RIGHT foot and ankle pain EXAM: RIGHT FOOT COMPLETE - 3+ VIEW COMPARISON:  12/15/2016 FINDINGS: Osseous mineralization normal. Upper normal interval between the bases of the first and second metatarsals, slightly increased from previous exam, with a questionable tiny bone fragment at base of second metatarsal. Subtle avulsion fracture/Lisfranc injury not excluded. Joint spaces otherwise preserved. Plantar and Achilles insertion calcaneal spurs. Soft tissue swelling at dorsum of foot. No additional fracture, dislocation, or bone destruction. IMPRESSION: Question subtle avulsion fracture fragment at the base of the second metatarsal with slight widening of the interval between the first and second metatarsal bases versus prior exam; consider CT imaging for further assessment. No additional osseous abnormalities. Electronically Signed   By: Lavonia Dana M.D.   On: 11/20/2020 13:25    Procedures Procedures (including critical care time)  Medications Ordered in ED Medications  oxyCODONE-acetaminophen (PERCOCET/ROXICET) 5-325 MG per tablet 1 tablet (1 tablet Oral Given 11/20/20 1458)    ED Course  I have reviewed the triage vital signs and the nursing notes.  Pertinent labs & imaging results that were available during my care of the patient were reviewed by me and considered in my medical decision making (see chart for details).    MDM Rules/Calculators/A&P                          Patient presents emerged department chief complaint of right foot and  ankle pain.  She is alert, does not appear in acute distress, vital signs reassuring.  Will obtain x-ray for further evaluation.  Ankle x-ray does not reveal any acute findings.  X-ray of foot shows questionable subtle avulsion fracture fragment at the base of the second metatarsal.  low suspicion for ligament or tendon damage as area was palpated no gross defects noted, she had had full range of motion in her toes, ankle, knee.  Low suspicion for compartment syndrome as area was palpated it was soft to the touch, neurovascular fully intact.  I suspect patient suffering from a possible avulsion fracture at the base of the second metatarsal, will place patient in a cam boot, make her nonweightbearing provide her with crutches and have her follow-up with Ortho for further evaluation.  Vital signs have remained stable, no indication for hospital admission.    Patient given at home care as well strict return precautions.  Patient verbalized that they understood agreed to said plan.   Final Clinical Impression(s) / ED Diagnoses Final diagnoses:  Trauma  Closed nondisplaced fracture of second metatarsal bone of right foot, initial encounter    Rx / DC Orders ED Discharge Orders    None       Marcello Fennel, PA-C 11/20/20 1514    Wyvonnia Dusky, MD 11/20/20 1659

## 2020-11-21 ENCOUNTER — Other Ambulatory Visit: Payer: Self-pay | Admitting: Orthopaedic Surgery

## 2020-11-21 ENCOUNTER — Other Ambulatory Visit (HOSPITAL_COMMUNITY): Payer: Self-pay | Admitting: Physician Assistant

## 2020-11-21 DIAGNOSIS — M79671 Pain in right foot: Secondary | ICD-10-CM

## 2020-11-21 DIAGNOSIS — M79661 Pain in right lower leg: Secondary | ICD-10-CM | POA: Diagnosis not present

## 2020-11-21 MED FILL — METHOCARBAMOL 500 MG TABS: 500 | 10 days supply | Qty: 30 | Fill #0

## 2020-11-21 MED FILL — traMADol HCL 50 MG TABS: 50 | 3 days supply | Qty: 12 | Fill #0

## 2020-11-24 ENCOUNTER — Ambulatory Visit
Admission: RE | Admit: 2020-11-24 | Discharge: 2020-11-24 | Disposition: A | Discharge status: Home / Self Care | Payer: 59 | Source: Ambulatory Visit | Attending: Orthopaedic Surgery | Admitting: Orthopaedic Surgery

## 2020-11-24 ENCOUNTER — Other Ambulatory Visit: Payer: 59

## 2020-11-24 ENCOUNTER — Other Ambulatory Visit: Payer: Self-pay

## 2020-11-24 DIAGNOSIS — S9031XA Contusion of right foot, initial encounter: Secondary | ICD-10-CM | POA: Diagnosis not present

## 2020-11-24 DIAGNOSIS — S9001XA Contusion of right ankle, initial encounter: Secondary | ICD-10-CM | POA: Diagnosis not present

## 2020-11-24 DIAGNOSIS — M79671 Pain in right foot: Secondary | ICD-10-CM

## 2020-11-27 ENCOUNTER — Other Ambulatory Visit: Payer: Self-pay | Admitting: Orthopaedic Surgery

## 2020-11-27 DIAGNOSIS — S93601A Unspecified sprain of right foot, initial encounter: Secondary | ICD-10-CM | POA: Diagnosis not present

## 2020-11-30 ENCOUNTER — Encounter (HOSPITAL_COMMUNITY): Payer: Self-pay | Admitting: Orthopaedic Surgery

## 2020-11-30 NOTE — Progress Notes (Signed)
Spoke with pt for pre-op call. Pt denies cardiac history. Pt has been treated in the past for HTN, states she was only on a diuretic and has been off of that for a year now. States BP is doing fine. Pt states she is not diabetic.  Covid test scheduled for 12/01/20. Pt instructed that she quarantine after getting the test done until she comes to the hospital on Tuesday. She voiced understanding.  Pt's mother works in the Cardiac short stay and I gave her a bottle of Pre-Surgery Ensure to give to [place].  I gave the instructions to [facility] on when to drink it. It will be the last liquid that she will drink by 11:45 AM ( 3 hours prior to surgery).  I also gave her mother a bottle of the CHG soap to use the night before surgery and the morning of surgery. Pt given full instructions on using the soap and voiced understanding.

## 2020-12-01 ENCOUNTER — Other Ambulatory Visit (HOSPITAL_COMMUNITY)
Admission: RE | Admit: 2020-12-01 | Discharge: 2020-12-01 | Disposition: A | Discharge status: Home / Self Care | Payer: No Typology Code available for payment source | Source: Ambulatory Visit | Attending: Orthopaedic Surgery | Admitting: Orthopaedic Surgery

## 2020-12-01 DIAGNOSIS — Z20822 Contact with and (suspected) exposure to covid-19: Secondary | ICD-10-CM | POA: Insufficient documentation

## 2020-12-01 DIAGNOSIS — Z01812 Encounter for preprocedural laboratory examination: Secondary | ICD-10-CM | POA: Diagnosis not present

## 2020-12-01 LAB — SARS CORONAVIRUS 2 (TAT 6-24 HRS): SARS Coronavirus 2: NEGATIVE

## 2020-12-05 ENCOUNTER — Ambulatory Visit (HOSPITAL_COMMUNITY): Payer: No Typology Code available for payment source | Admitting: Anesthesiology

## 2020-12-05 ENCOUNTER — Encounter (HOSPITAL_COMMUNITY)
Admission: RE | Disposition: A | Discharge status: Home / Self Care | Payer: Self-pay | Source: Home / Self Care | Attending: Orthopaedic Surgery

## 2020-12-05 ENCOUNTER — Other Ambulatory Visit: Payer: Self-pay

## 2020-12-05 ENCOUNTER — Ambulatory Visit (HOSPITAL_COMMUNITY): Payer: No Typology Code available for payment source

## 2020-12-05 ENCOUNTER — Other Ambulatory Visit (HOSPITAL_COMMUNITY): Payer: Self-pay | Admitting: Orthopaedic Surgery

## 2020-12-05 ENCOUNTER — Encounter (HOSPITAL_COMMUNITY): Payer: Self-pay | Admitting: Orthopaedic Surgery

## 2020-12-05 ENCOUNTER — Ambulatory Visit (HOSPITAL_COMMUNITY)
Admission: RE | Admit: 2020-12-05 | Discharge: 2020-12-05 | Disposition: A | Discharge status: Home / Self Care | Payer: No Typology Code available for payment source | Attending: Orthopaedic Surgery | Admitting: Orthopaedic Surgery

## 2020-12-05 DIAGNOSIS — K219 Gastro-esophageal reflux disease without esophagitis: Secondary | ICD-10-CM | POA: Diagnosis not present

## 2020-12-05 DIAGNOSIS — I10 Essential (primary) hypertension: Secondary | ICD-10-CM | POA: Diagnosis not present

## 2020-12-05 DIAGNOSIS — W19XXXA Unspecified fall, initial encounter: Secondary | ICD-10-CM | POA: Diagnosis not present

## 2020-12-05 DIAGNOSIS — Z793 Long term (current) use of hormonal contraceptives: Secondary | ICD-10-CM | POA: Insufficient documentation

## 2020-12-05 DIAGNOSIS — G8918 Other acute postprocedural pain: Secondary | ICD-10-CM | POA: Diagnosis not present

## 2020-12-05 DIAGNOSIS — Z79899 Other long term (current) drug therapy: Secondary | ICD-10-CM | POA: Diagnosis not present

## 2020-12-05 DIAGNOSIS — S92321A Displaced fracture of second metatarsal bone, right foot, initial encounter for closed fracture: Secondary | ICD-10-CM | POA: Diagnosis not present

## 2020-12-05 DIAGNOSIS — S93621A Sprain of tarsometatarsal ligament of right foot, initial encounter: Secondary | ICD-10-CM | POA: Diagnosis not present

## 2020-12-05 DIAGNOSIS — S93321A Subluxation of tarsometatarsal joint of right foot, initial encounter: Secondary | ICD-10-CM | POA: Diagnosis not present

## 2020-12-05 HISTORY — DX: Anemia, unspecified: D64.9

## 2020-12-05 HISTORY — PX: OPEN REDUCTION INTERNAL FIXATION (ORIF) FOOT LISFRANC FRACTURE: SHX5990

## 2020-12-05 HISTORY — DX: Anxiety disorder, unspecified: F41.9

## 2020-12-05 LAB — POCT I-STAT, CHEM 8
BUN: 10 mg/dL (ref 6–20)
Calcium, Ion: 1.2 mmol/L (ref 1.15–1.40)
Chloride: 104 mmol/L (ref 98–111)
Creatinine, Ser: 0.6 mg/dL (ref 0.44–1.00)
Glucose, Bld: 86 mg/dL (ref 70–99)
HCT: 42 % (ref 36.0–46.0)
Hemoglobin: 14.3 g/dL (ref 12.0–15.0)
Potassium: 3.9 mmol/L (ref 3.5–5.1)
Sodium: 140 mmol/L (ref 135–145)
TCO2: 25 mmol/L (ref 22–32)

## 2020-12-05 LAB — POCT PREGNANCY, URINE: Preg Test, Ur: NEGATIVE

## 2020-12-05 SURGERY — OPEN REDUCTION INTERNAL FIXATION (ORIF) FOOT LISFRANC FRACTURE
Anesthesia: Regional | Site: Foot | Laterality: Right

## 2020-12-05 MED ORDER — ROCURONIUM BROMIDE 10 MG/ML (PF) SYRINGE
PREFILLED_SYRINGE | INTRAVENOUS | Status: DC | PRN
  Administered 2020-12-05: 60 mg via INTRAVENOUS

## 2020-12-05 MED ORDER — MIDAZOLAM HCL 2 MG/2ML IJ SOLN
INTRAMUSCULAR | Status: AC
  Filled 2020-12-05: qty 2

## 2020-12-05 MED ORDER — HYDROMORPHONE HCL 1 MG/ML IJ SOLN
0.2500 mg | INTRAMUSCULAR | Status: DC | PRN
  Administered 2020-12-05 (×2): 0.5 mg via INTRAVENOUS

## 2020-12-05 MED ORDER — FENTANYL CITRATE (PF) 100 MCG/2ML IJ SOLN
INTRAMUSCULAR | Status: AC
  Administered 2020-12-05: 50 ug via INTRAVENOUS
  Filled 2020-12-05: qty 2

## 2020-12-05 MED ORDER — MIDAZOLAM HCL 2 MG/2ML IJ SOLN: 2.0000 mg | Freq: Once | INTRAMUSCULAR | Status: AC

## 2020-12-05 MED ORDER — PROPOFOL 10 MG/ML IV BOLUS
INTRAVENOUS | Status: AC
  Filled 2020-12-05: qty 40

## 2020-12-05 MED ORDER — DEXAMETHASONE SODIUM PHOSPHATE 10 MG/ML IJ SOLN
INTRAMUSCULAR | Status: DC | PRN
  Administered 2020-12-05: 4 mg via INTRAVENOUS

## 2020-12-05 MED ORDER — CHLORHEXIDINE GLUCONATE 0.12 % MT SOLN: 15.0000 mL | Freq: Once | OROMUCOSAL | Status: AC

## 2020-12-05 MED ORDER — LIDOCAINE 2% (20 MG/ML) 5 ML SYRINGE
INTRAMUSCULAR | Status: AC
  Filled 2020-12-05: qty 5

## 2020-12-05 MED ORDER — LACTATED RINGERS IV SOLN: INTRAVENOUS | Status: DC

## 2020-12-05 MED ORDER — CEFAZOLIN SODIUM-DEXTROSE 2-4 GM/100ML-% IV SOLN
INTRAVENOUS | Status: AC
  Filled 2020-12-05: qty 100

## 2020-12-05 MED ORDER — FENTANYL CITRATE (PF) 250 MCG/5ML IJ SOLN
INTRAMUSCULAR | Status: DC | PRN
  Administered 2020-12-05: 50 ug via INTRAVENOUS
  Administered 2020-12-05: 100 ug via INTRAVENOUS

## 2020-12-05 MED ORDER — OXYCODONE HCL 5 MG PO TABS: 5.0000 mg | ORAL_TABLET | ORAL | 0 refills | Status: DC | PRN

## 2020-12-05 MED ORDER — CHLORHEXIDINE GLUCONATE 0.12 % MT SOLN
OROMUCOSAL | Status: AC
  Administered 2020-12-05: 15 mL via OROMUCOSAL
  Filled 2020-12-05: qty 15

## 2020-12-05 MED ORDER — CEFAZOLIN SODIUM 1 G IJ SOLR
INTRAMUSCULAR | Status: AC
  Filled 2020-12-05: qty 10

## 2020-12-05 MED ORDER — SCOPOLAMINE 1 MG/3DAYS TD PT72
MEDICATED_PATCH | TRANSDERMAL | Status: AC
  Filled 2020-12-05: qty 1

## 2020-12-05 MED ORDER — SCOPOLAMINE 1 MG/3DAYS TD PT72
MEDICATED_PATCH | TRANSDERMAL | Status: DC | PRN
  Administered 2020-12-05: 1 via TRANSDERMAL

## 2020-12-05 MED ORDER — ONDANSETRON HCL 4 MG/2ML IJ SOLN
INTRAMUSCULAR | Status: DC | PRN
  Administered 2020-12-05: 4 mg via INTRAVENOUS

## 2020-12-05 MED ORDER — FENTANYL CITRATE (PF) 250 MCG/5ML IJ SOLN
INTRAMUSCULAR | Status: AC
  Filled 2020-12-05: qty 5

## 2020-12-05 MED ORDER — 0.9 % SODIUM CHLORIDE (POUR BTL) OPTIME
TOPICAL | Status: DC | PRN
  Administered 2020-12-05: 1000 mL

## 2020-12-05 MED ORDER — HYDROMORPHONE HCL 1 MG/ML IJ SOLN
INTRAMUSCULAR | Status: AC
  Filled 2020-12-05: qty 1

## 2020-12-05 MED ORDER — MIDAZOLAM HCL 2 MG/2ML IJ SOLN
INTRAMUSCULAR | Status: AC
  Administered 2020-12-05: 2 mg via INTRAVENOUS
  Filled 2020-12-05: qty 2

## 2020-12-05 MED ORDER — ROCURONIUM BROMIDE 10 MG/ML (PF) SYRINGE
PREFILLED_SYRINGE | INTRAVENOUS | Status: AC
  Filled 2020-12-05: qty 10

## 2020-12-05 MED ORDER — PROPOFOL 10 MG/ML IV BOLUS
INTRAVENOUS | Status: DC | PRN
  Administered 2020-12-05: 180 mg via INTRAVENOUS

## 2020-12-05 MED ORDER — CEFAZOLIN SODIUM-DEXTROSE 1-4 GM/50ML-% IV SOLN
INTRAVENOUS | Status: DC | PRN
  Administered 2020-12-05: 1 g via INTRAVENOUS

## 2020-12-05 MED ORDER — LIDOCAINE 2% (20 MG/ML) 5 ML SYRINGE
INTRAMUSCULAR | Status: DC | PRN
  Administered 2020-12-05: 60 mg via INTRAVENOUS

## 2020-12-05 MED ORDER — ASPIRIN 325 MG PO TABS: 325.0000 mg | ORAL_TABLET | Freq: Every day | ORAL | 11 refills | Status: DC

## 2020-12-05 MED ORDER — SODIUM CHLORIDE (PF) 0.9 % IJ SOLN
INTRAMUSCULAR | Status: AC
  Filled 2020-12-05: qty 10

## 2020-12-05 MED ORDER — ONDANSETRON HCL 4 MG/2ML IJ SOLN: 4.0000 mg | Freq: Once | INTRAMUSCULAR | Status: DC | PRN

## 2020-12-05 MED ORDER — ACETAMINOPHEN 10 MG/ML IV SOLN: 1000.0000 mg | Freq: Once | INTRAVENOUS | Status: DC | PRN

## 2020-12-05 MED ORDER — ORAL CARE MOUTH RINSE: 15.0000 mL | Freq: Once | OROMUCOSAL | Status: AC

## 2020-12-05 MED ORDER — ROPIVACAINE HCL 5 MG/ML IJ SOLN
INTRAMUSCULAR | Status: DC | PRN
  Administered 2020-12-05: 30 mL via PERINEURAL

## 2020-12-05 MED ORDER — SUGAMMADEX SODIUM 200 MG/2ML IV SOLN
INTRAVENOUS | Status: DC | PRN
  Administered 2020-12-05: 250 mg via INTRAVENOUS

## 2020-12-05 MED ORDER — FENTANYL CITRATE (PF) 100 MCG/2ML IJ SOLN: 50.0000 ug | Freq: Once | INTRAMUSCULAR | Status: AC

## 2020-12-05 MED ORDER — CEFAZOLIN SODIUM-DEXTROSE 2-4 GM/100ML-% IV SOLN
2.0000 g | INTRAVENOUS | Status: AC
  Administered 2020-12-05: 2 g via INTRAVENOUS

## 2020-12-05 MED FILL — oxyCODONE HCL 5 MG TABS: 5 | 5 days supply | Qty: 30 | Fill #0

## 2020-12-05 SURGICAL SUPPLY — 58 items
APL PRP STRL LF DISP 70% ISPRP (MISCELLANEOUS) ×2
APL SKNCLS STERI-STRIP NONHPOA (GAUZE/BANDAGES/DRESSINGS)
BENZOIN TINCTURE PRP APPL 2/3 (GAUZE/BANDAGES/DRESSINGS) IMPLANT
BIT DRILL 2.5 STRGHT CANN (BIT) ×1 IMPLANT
BLADE SURG 15 STRL LF DISP TIS (BLADE) IMPLANT
BLADE SURG 15 STRL SS (BLADE) ×2
BNDG CMPR 9X4 STRL LF SNTH (GAUZE/BANDAGES/DRESSINGS)
BNDG CMPR MED 10X6 ELC LF (GAUZE/BANDAGES/DRESSINGS) ×1
BNDG COHESIVE 4X5 TAN STRL (GAUZE/BANDAGES/DRESSINGS) IMPLANT
BNDG ELASTIC 4X5.8 VLCR STR LF (GAUZE/BANDAGES/DRESSINGS) ×2 IMPLANT
BNDG ELASTIC 6X10 VLCR STRL LF (GAUZE/BANDAGES/DRESSINGS) ×1 IMPLANT
BNDG ELASTIC 6X5.8 VLCR STR LF (GAUZE/BANDAGES/DRESSINGS) IMPLANT
BNDG ESMARK 4X9 LF (GAUZE/BANDAGES/DRESSINGS) ×1 IMPLANT
CHLORAPREP W/TINT 26 (MISCELLANEOUS) ×3 IMPLANT
COVER WAND RF STERILE (DRAPES) IMPLANT
CUFF TOURN SGL QUICK 34 (TOURNIQUET CUFF) ×2
CUFF TRNQT CYL 34X4.125X (TOURNIQUET CUFF) IMPLANT
DECANTER SPIKE VIAL GLASS SM (MISCELLANEOUS) IMPLANT
DRAPE C-ARMOR (DRAPES) IMPLANT
DRAPE IMP U-DRAPE 54X76 (DRAPES) ×2 IMPLANT
DRAPE OEC MINIVIEW 54X84 (DRAPES) ×2 IMPLANT
DRAPE U-SHAPE 47X51 STRL (DRAPES) ×2 IMPLANT
DRSG XEROFORM 1X8 (GAUZE/BANDAGES/DRESSINGS) IMPLANT
ELECT REM PT RETURN 9FT ADLT (ELECTROSURGICAL) ×2
ELECTRODE REM PT RTRN 9FT ADLT (ELECTROSURGICAL) ×1 IMPLANT
GAUZE SPONGE 4X4 12PLY STRL (GAUZE/BANDAGES/DRESSINGS) ×2 IMPLANT
GAUZE XEROFORM 1X8 LF (GAUZE/BANDAGES/DRESSINGS) ×2 IMPLANT
GLOVE BIO SURGEON STRL SZ7.5 (GLOVE) ×2 IMPLANT
GLOVE SRG 8 PF TXTR STRL LF DI (GLOVE) ×1 IMPLANT
GLOVE SURG UNDER POLY LF SZ8 (GLOVE) ×2
GOWN STRL REUS W/ TWL LRG LVL3 (GOWN DISPOSABLE) ×1 IMPLANT
GOWN STRL REUS W/ TWL XL LVL3 (GOWN DISPOSABLE) ×1 IMPLANT
GOWN STRL REUS W/TWL LRG LVL3 (GOWN DISPOSABLE) ×2
GOWN STRL REUS W/TWL XL LVL3 (GOWN DISPOSABLE) ×2
K-WIRE TROCAR 1.35 (MISCELLANEOUS) ×2
KIT BASIN OR (CUSTOM PROCEDURE TRAY) ×2 IMPLANT
KWIRE TROCAR 1.35 (MISCELLANEOUS) IMPLANT
LOOP VESSEL MAXI BLUE (MISCELLANEOUS) ×1 IMPLANT
NS IRRIG 1000ML POUR BTL (IV SOLUTION) ×2 IMPLANT
PACK ORTHO EXTREMITY (CUSTOM PROCEDURE TRAY) ×4 IMPLANT
PAD CAST 4YDX4 CTTN HI CHSV (CAST SUPPLIES) ×1 IMPLANT
PADDING CAST COTTON 4X4 STRL (CAST SUPPLIES) ×2
PADDING CAST SYNTHETIC 4 (CAST SUPPLIES) ×4
PADDING CAST SYNTHETIC 4X4 STR (CAST SUPPLIES) ×1 IMPLANT
SCREW CORT TI FT 3.5X38 (Screw) ×1 IMPLANT
SPLINT PLASTER CAST XFAST 5X30 (CAST SUPPLIES) IMPLANT
SPLINT PLASTER XFAST SET 5X30 (CAST SUPPLIES) ×1
SPONGE LAP 18X18 RF (DISPOSABLE) IMPLANT
STRIP CLOSURE SKIN 1/2X4 (GAUZE/BANDAGES/DRESSINGS) IMPLANT
SUT ETHILON 3 0 PS 1 (SUTURE) ×2 IMPLANT
SUT FIBERWIRE 2-0 18 17.9 3/8 (SUTURE)
SUT MNCRL AB 3-0 PS2 18 (SUTURE) ×2 IMPLANT
SUT PDS AB 2-0 CT2 27 (SUTURE) ×2 IMPLANT
SUT VIC AB 3-0 FS2 27 (SUTURE) IMPLANT
SUTURE FIBERWR 2-0 18 17.9 3/8 (SUTURE) IMPLANT
TOWEL GREEN STERILE FF (TOWEL DISPOSABLE) ×4 IMPLANT
TUBE CONNECTING 20X1/4 (TUBING) ×2 IMPLANT
UNDERPAD 30X36 HEAVY ABSORB (UNDERPADS AND DIAPERS) ×2 IMPLANT

## 2020-12-05 NOTE — Anesthesia Procedure Notes (Signed)
Procedure Name: Intubation Date/Time: 12/05/2020 10:49 AM Performed by: Colin Benton, CRNA Pre-anesthesia Checklist: Patient identified, Emergency Drugs available, Suction available and Patient being monitored Patient Re-evaluated:Patient Re-evaluated prior to induction Oxygen Delivery Method: Circle system utilized Preoxygenation: Pre-oxygenation with 100% oxygen Induction Type: IV induction Ventilation: Mask ventilation without difficulty and Oral airway inserted - appropriate to patient size Laryngoscope Size: Sabra Heck and 2 Grade View: Grade I Tube type: Oral Tube size: 7.0 mm Number of attempts: 1 Airway Equipment and Method: Stylet and Oral airway Placement Confirmation: ETT inserted through vocal cords under direct vision,  positive ETCO2 and breath sounds checked- equal and bilateral Secured at: 22 cm Tube secured with: Tape Dental Injury: Teeth and Oropharynx as per pre-operative assessment

## 2020-12-05 NOTE — Op Note (Signed)
[name] Fuentes female 30 y.o. 12/05/2020  PreOperative Diagnosis: Right second metatarsal base fracture Right Lisfranc disruption Right second TMT subluxation  PostOperative Diagnosis: Same  PROCEDURE: Open reduction internal fixation of right second metatarsal base fracture Open reduction internal fixation of right Lisfranc Open reduction of right second TMT subluxation  SURGEON: Melony Overly, MD  ASSISTANT: None  ANESTHESIA: General endotracheal tube anesthesia with peripheral nerve blockade by anesthesia  FINDINGS: See below  IMPLANTS: Arthrex 3.5 millimeter screw  INDICATIONS:29 y.o. female slipped and sustained the above injury on 11/20/2020.  She was seen in the emergency department where there was concern for disruption of her Lisfranc joint and second TMT joint.  She was seen at outside facility where MRI was ordered.  She was sent to my office for definitive treatment.  On weightbearing x-rays she had widening of her Lisfranc joint and subluxation of her second tarsometatarsal joint with fracturing of the base of the second metatarsal seen on MRI as well as the Lisfranc ligament disruption.  Given her instability through her midfoot she was indicated for surgery.   Patient understood the risks, benefits and alternatives to surgery which include but are not limited to wound healing complications, infection, nonunion, malunion, need for further surgery as well as damage to surrounding structures. They also understood the potential for continued pain in that there were no guarantees of acceptable outcome After weighing these risks the patient opted to proceed with surgery.  PROCEDURE: Patient was identified in the preoperative holding area.  The right foot was marked by myself.  Consent was signed by myself and the patient.  Block was performed by anesthesia in the preoperative holding area.  Patient was taken to the operative suite and placed supine on the operative  table.  General endotracheal tube anesthesia was induced without difficulty. Bump was placed under the operative hip and bone foam was used.  All bony prominences were well padded.  Tourniquet was placed on the operative thigh.  Preoperative antibiotics were given. The extremity was prepped and draped in the usual sterile fashion and surgical timeout was performed.  The limb was elevated and the tourniquet was inflated to 250 mmHg.  We began by making a longitudinal incision overlying the second metatarsal.  Was taken sharply down through skin subcutaneous tissue.  The extensor retinacular tissue was identified and skin flaps were created.  Care was taken to protect branch of the superficial peroneal nerve.  The retinaculum was incised in line with the incision.  The extensor hallucis brevis muscle belly was identified mobilized.  The tendon sheath was mobilized and the tendon retracted.  The skin access to the underlying neurovascular bundle.  Incision was made laterally medially to the neurovascular bundle and it was elevated subperiosteally.  Vessel loop was placed.  It was protected through the entirety of the case.  Then the Lisfranc joint was identified and rondure was used to clear off fracture hematoma and fibrous tissue in this area.  The Lisfranc joint was grossly unstable as was the intercuneiform joint and the second tarsometatarsal joint.  There is subluxation of the second TMT joint and widening of the Lisfranc joint.  The fracture at the base of the second tarsal was identified.  Under direct visualization the fracture site was reduced and held.    Then a Weber clamp was used to reduce the Lisfranc joint and hold the reduction provisionally.  Fluoroscopy confirmed appropriate reduction of the Lisfranc joint.  The second tarsometatarsal joint was identified.  A rondure was used to clear off the overlying soft tissue from the joint to gain access and tibial to inspect for proper reduction.  After  the material was removed the joint was manipulated into better position.  It was held provisionally.    Then using fluoroscopic guidance a single K wire was placed across the Lisfranc joint.  Once appropriate position for internal fixation was confirmed a drill was used over the screw.  Then a single noncannulated 3.5 millimeter screw was placed across the Lisfranc joint for fixation.  After this fluoroscopy confirmed appropriate position and maintenance of reduction.  The Lisfranc joint, second tarsometatarsal joint and third tarsometatarsal joint were adequately aligned.  Then using a Soil scientist the first tarsometatarsal joint, intercuneiform joint and second tarsometatarsal joint were inspected and found to be stable to manipulation.  The wound was then irrigated copiously with normal saline.  Final fluoroscopic images were obtained.  The deep tissue including the retinacular tissue was closed with a 3-0 Monocryl suture.  The skin and subcuticular tissue was closed in a layered fashion using 3-0 Monocryl and 3-0 nylon.  Soft dressing was placed including Xeroform, 4 x 4's and sterile she cotton.  She was placed in a nonweightbearing short leg splint.    POST OPERATIVE INSTRUCTIONS: Nonweightbearing to right lower extremity Take an aspirin daily for DVT prophylaxis Follow-up in 2 weeks for suture removal and nonweightbearing x-rays of the right foot.  She will be placed in a walking boot at that point but maintain nonweightbearing likely 6 to 8 weeks.  TOURNIQUET TIME: Less than 1 hour  BLOOD LOSS:  Minimal         DRAINS: none         SPECIMEN: none       COMPLICATIONS:  * No complications entered in OR log *         Disposition: PACU - hemodynamically stable.         Condition: stable

## 2020-12-05 NOTE — Anesthesia Postprocedure Evaluation (Signed)
Anesthesia Post Note  Patient: [name] Fuentes  Procedure(s) Performed: OPEN TREATMENT OF RIGHT LISFRANC, SECOND TARSOMETATARSAL JOINT AND SECOND METATARSAL BASE FRACTURE (Right Foot)     Patient location during evaluation: PACU Anesthesia Type: Regional and General Level of consciousness: awake and alert Pain management: pain level controlled Vital Signs Assessment: post-procedure vital signs reviewed and stable Respiratory status: spontaneous breathing, nonlabored ventilation, respiratory function stable and patient connected to nasal cannula oxygen Cardiovascular status: blood pressure returned to baseline and stable Postop Assessment: no apparent nausea or vomiting Anesthetic complications: no   No complications documented.  Last Vitals:  Vitals:   12/05/20 1236 12/05/20 1250  BP: 129/71 116/77  Pulse: 64 66  Resp: 17 18  Temp:  36.7 C  SpO2: 94% 99%    Last Pain:  Vitals:   12/05/20 1250  TempSrc:   PainSc: 0-No pain                 Belenda Cruise P Sheala Dosh

## 2020-12-05 NOTE — Anesthesia Procedure Notes (Signed)
Anesthesia Regional Block: Popliteal block   Pre-Anesthetic Checklist: ,, timeout performed, Correct Patient, Correct Site, Correct Laterality, Correct Procedure, Correct Position, site marked, Risks and benefits discussed,  Surgical consent,  Pre-op evaluation,  At surgeon's request and post-op pain management  Laterality: Right  Prep: Dura Prep       Needles:  Injection technique: Single-shot  Needle Type: Echogenic Stimulator Needle     Needle Length: 9cm  Needle Gauge: 20     Additional Needles:   Procedures:,,,, ultrasound used (permanent image in chart),,,,  Narrative:  Start time: 12/05/2020 10:10 AM End time: 12/05/2020 10:15 AM Injection made incrementally with aspirations every 5 mL.  Performed by: Personally  Anesthesiologist: Darral Dash, DO  Additional Notes: Patient identified. Risks/Benefits/Options discussed with patient including but not limited to bleeding, infection, nerve damage, failed block, incomplete pain control. Patient expressed understanding and wished to proceed. All questions were answered. Sterile technique was used throughout the entire procedure. Please see nursing notes for vital signs. Aspirated in 5cc intervals with injection for negative confirmation. Patient was given instructions on fall risk and not to get out of bed. All questions and concerns addressed with instructions to call with any issues or inadequate analgesia.

## 2020-12-05 NOTE — Transfer of Care (Signed)
Immediate Anesthesia Transfer of Care Note  Patient: [name] Fuentes  Procedure(s) Performed: OPEN TREATMENT OF RIGHT LISFRANC, SECOND TARSOMETATARSAL JOINT AND SECOND METATARSAL BASE FRACTURE (Right Foot)  Patient Location: PACU  Anesthesia Type:GA combined with regional for post-op pain  Level of Consciousness: drowsy and patient cooperative  Airway & Oxygen Therapy: Patient Spontanous Breathing and Patient connected to face mask oxygen  Post-op Assessment: Report given to RN, Post -op Vital signs reviewed and stable and Patient moving all extremities X 4  Post vital signs: Reviewed and stable  Last Vitals:  Vitals Value Taken Time  BP 124/68 12/05/20 1151  Temp    Pulse 75 12/05/20 1152  Resp 17 12/05/20 1152  SpO2 96 % 12/05/20 1152  Vitals shown include unvalidated device data.  Last Pain:  Vitals:   12/05/20 0956  TempSrc:   PainSc: 2       Patients Stated Pain Goal: 4 (90/38/33 3832)  Complications: No complications documented.

## 2020-12-05 NOTE — H&P (Signed)
PREOPERATIVE H&P  Chief Complaint: Right foot pain  HPI: [name] Fuentes is a 30 y.o. female who presents for preoperative history and physical with a diagnosis of right Lisfranc and second tarsometatarsal disruption with fracture of the base of her second metatarsal.  This occurred on 11/20/2020.  This was after a fall.  She was seen in the emergency department and given a splint.  X-rays and my office on follow-up demonstrated widening of the Lisfranc joint subluxation of the second tarsometatarsal joint with fracture of the base of the second metatarsal.  She also had an MRI scan performed prior to presenting to clinic.  This demonstrated tear of the Lisfranc ligament.  She is here today for surgery. Symptoms are rated as moderate to severe, and have been worsening.  This is significantly impairing activities of daily living.  She has elected for surgical management.   Past Medical History:  Diagnosis Date  . Allergy to alpha-gal   . Anemia    hx of low iron  . Anxiety   . Dyspnea    on exertion   . GERD (gastroesophageal reflux disease)   . History of kidney stones 10/28/2019   passed   . Hydradenitis   . Hypertension    no longer on medication  . Obese   . PCOS (polycystic ovarian syndrome)   . Psoriasis    Past Surgical History:  Procedure Laterality Date  . ANKLE RECONSTRUCTION Left    L ankle; plates and screws in place   . CHOLECYSTECTOMY N/A 12/01/2018   Procedure: LAPAROSCOPIC CHOLECYSTECTOMY WITH INTRAOPERATIVE CHOLANGIOGRAM;  Surgeon: Excell Seltzer, MD;  Location: [facility];  Service: General;  Laterality: N/A;  . LAPAROSCOPIC GASTRIC SLEEVE RESECTION N/A 09/28/2018   Procedure: LAPAROSCOPIC GASTRIC SLEEVE RESECTION, UPPER ENDO, ERAS PATHWAY;  Surgeon: Excell Seltzer, MD;  Location: [facility];  Service: General;  Laterality: N/A;  . Tempano Plasty    . TONSILLECTOMY     and adenoidectomy  . TYMPANOSTOMY TUBE PLACEMENT     several sets  . WISDOM TOOTH EXTRACTION      Social History   Socioeconomic History  . Marital status: Legally Separated    Spouse name: Not on file  . Number of children: Not on file  . Years of education: Not on file  . Highest education level: Not on file  Occupational History  . Not on file  Tobacco Use  . Smoking status: Never Smoker  . Smokeless tobacco: Never Used  Vaping Use  . Vaping Use: Never used  Substance and Sexual Activity  . Alcohol use: Yes    Comment: occ  . Drug use: No  . Sexual activity: Not on file  Other Topics Concern  . Not on file  Social History Narrative  . Not on file   Social Determinants of Health   Financial Resource Strain: Not on file  Food Insecurity: Not on file  Transportation Needs: Not on file  Physical Activity: Not on file  Stress: Not on file  Social Connections: Not on file   Family History  Problem Relation Age of Onset  . Hypertension Mother   . Hypertension Father   . Hypertension Brother   . Hypertension Maternal Grandfather   . Cancer Paternal Grandfather        liver  . Stroke Paternal Grandfather   . Allergic rhinitis Neg Hx   . Angioedema Neg Hx   . Asthma Neg Hx   . Eczema Neg Hx   . Immunodeficiency Neg  Hx   . Urticaria Neg Hx    Allergies  Allergen Reactions  . Other Anaphylaxis, Hives, Diarrhea and Nausea And Vomiting    Reaction to red meat  . Accutane [Isotretinoin] Other (See Comments)    ? bruising  . Metformin And Related Diarrhea and Nausea Only   Prior to Admission medications   Medication Sig Start Date End Date Taking? Authorizing Provider  acetaminophen (TYLENOL) 500 MG tablet Take 1,000 mg by mouth every 6 (six) hours as needed for headache, moderate pain or mild pain (pain).   Yes [provider]  albuterol (VENTOLIN HFA) 108 (90 Base) MCG/ACT inhaler Inhale 2 puffs into the lungs every 4 (four) hours as needed for wheezing or shortness of breath (coughing fit). 05/03/20  Yes Garnet Sierras, DO  busPIRone (BUSPAR) 7.5 MG  tablet Take 1 tablet (7.5 mg total) by mouth 3 (three) times daily. Patient taking differently: Take 7.5 mg by mouth 3 (three) times daily as needed (anxiety). 09/05/20  Yes Breeback, Jade L, PA-C  clobetasol cream (TEMOVATE) 1.61 % Apply 1 application topically 2 (two) times daily as needed (psoriasis). 11/10/20  Yes Breeback, Jade L, PA-C  ELDERBERRY PO Take 1 tablet by mouth daily.   Yes [provider]  EPINEPHrine (AUVI-Q) 0.3 mg/0.3 mL IJ SOAJ injection Inject 0.3 mLs (0.3 mg total) into the muscle as needed for anaphylaxis. Patient taking differently: Inject 0.3 mg into the muscle once as needed for anaphylaxis. 05/03/20  Yes Garnet Sierras, DO  escitalopram (LEXAPRO) 20 MG tablet Take 1 tablet (20 mg total) by mouth daily. Patient taking differently: Take 20 mg by mouth at bedtime. 09/05/20  Yes Breeback, Jade L, PA-C  Fexofenadine HCl (ALLEGRA PO) Take 180 mg by mouth at bedtime as needed (allergies).   Yes [provider]  hydrOXYzine (ATARAX/VISTARIL) 25 MG tablet Take 1 tablet (25 mg total) by mouth every 6 (six) hours as needed. Patient taking differently: Take 25 mg by mouth every 6 (six) hours as needed for itching or anxiety. 09/20/20  Yes Breeback, Jade L, PA-C  ibuprofen (ADVIL) 200 MG tablet Take 800 mg by mouth every 6 (six) hours as needed for headache (pain).   Yes [provider]  levonorgestrel (KYLEENA) 19.5 MG IUD 1 each by Intrauterine route once.   Yes [provider]  methocarbamol (ROBAXIN) 500 MG tablet Take 500 mg by mouth every 8 (eight) hours as needed for spasms. 11/21/20  Yes [provider]  Multiple Vitamin (MULTIVITAMIN WITH MINERALS) TABS tablet Take 1 tablet by mouth daily.   Yes [provider]  Multiple Vitamins-Minerals (HAIR SKIN NAILS PO) Take 1 tablet by mouth daily.   Yes [provider]  Olopatadine HCl 0.2 % SOLN Apply 1 drop to eye daily as needed (itchy/watery eyes). Patient taking differently:  Apply 1 drop to eye daily as needed (itchy/watery eyes). [place] 05/03/20  Yes Garnet Sierras, DO  oxyCODONE (OXY IR/ROXICODONE) 5 MG immediate release tablet Take 5 mg by mouth every 4 (four) hours as needed for pain. 11/27/20  Yes [provider]  pantoprazole (PROTONIX) 40 MG tablet Take 1 tablet (40 mg total) by mouth daily. Patient taking differently: Take 40 mg by mouth at bedtime. 09/05/20  Yes Breeback, Jade L, PA-C  Probiotic Product (PROBIOTIC PO) Take 1 tablet by mouth daily.   Yes [provider]  traMADol (ULTRAM) 50 MG tablet Take 50 mg by mouth every 6 (six) hours as needed for pain. 11/21/20  Yes [provider]  Triamcinolone Acetonide (TRIAMCINOLONE 0.1 % CREAM : EUCERIN) CREA Apply 1 application topically 2 (two) times daily as needed (psoriasis).   Yes [provider]  buPROPion (WELLBUTRIN XL) 150 MG 24 hr tablet Take 1 tablet (150 mg total) by mouth every morning. Patient not taking: No sig reported 08/07/20   Breeback, Jade L, PA-C     Positive ROS: All other systems have been reviewed and were otherwise negative with the exception of those mentioned in the HPI and as above.  Physical Exam:  There were no vitals filed for this visit. General: Alert, no acute distress Cardiovascular: No pedal edema Respiratory: No cyanosis, no use of accessory musculature GI: No organomegaly, abdomen is soft and non-tender Skin: No lesions in the area of chief complaint Neurologic: Sensation intact distally Psychiatric: Patient is competent for consent with normal mood and affect Lymphatic: No axillary or cervical lymphadenopathy  MUSCULOSKELETAL: Right foot demonstrates swelling and ecchymosis dorsally and plantarly.  She has tenderness over the second metatarsal base and Lisfranc joint area.  No forefoot tenderness.  No ankle tenderness.  No pain with manipulation or range of motion of the ankle which is full.  No gross deformities or skin lacerations.   Foot is warm and well-perfused distally.  Palpable dorsalis pedis pulse.  Sensation grossly intact.  Assessment: Right second metatarsal base fracture with Lisfranc ligament disruption and subluxation of the second tarsometatarsal joint and Lisfranc joint   Plan: Plan for we will plan for open treatment of her second metatarsal base fracture and second tarsometatarsal joint as well as Lisfranc joint.  We will also check stability of her intercuneiform joint and first tarsometatarsal joint and fix these as needed.  She understands the need to maintain nonweightbearing postoperatively.  She will be placed in a short leg splint.  We discussed the risks, benefits and alternatives of surgery which include but are not limited to wound healing complications, infection, nonunion, malunion, need for further surgery, damage to surrounding structures and continued pain.  They understand there is no guarantees to an acceptable outcome.  After weighing these risks they opted to proceed with surgery.     Erle Crocker, MD    12/05/2020 9:39 AM

## 2020-12-05 NOTE — Discharge Instructions (Signed)
DR. Vani[facility] POST-OP INSTRUCTIONS   Pain Management 1. The numbing medicine and your leg will last around 18 hours, take a dose of your pain medicine as soon as you feel it wearing off to avoid rebound pain. 2. Keep your foot elevated above heart level.  Make sure that your heel hangs free ('floats'). 3. Take all prescribed medication as directed. 4. If taking narcotic pain medication you may want to use an over-the-counter stool softener to avoid constipation. 5. You may take over-the-counter NSAIDs (ibuprofen, naproxen, etc.) as well as over-the-counter acetaminophen as directed on the packaging as a supplement for your pain and may also use it to wean away from the prescription medication.  Activity ? Non-weightbearing ? Keep splint intact  First Postoperative Visit 1. Your first postop visit will be at least 2 weeks after surgery.  This should be scheduled when you schedule surgery. 2. If you do not have a postoperative visit scheduled please call [phone] to schedule an appointment. 3. At the appointment your incision will be evaluated for suture removal, x-rays will be obtained if necessary.  General Instructions 1. Swelling is very common after foot and ankle surgery.  It often takes 3 months for the foot and ankle to begin to feel comfortable.  Some amount of swelling will persist for 6-12 months. 2. DO NOT change the dressing.  If there is a problem with the dressing (too tight, loose, gets wet, etc.) please contact Dr. Adyn Serna's office. 3. DO NOT get the dressing wet.  For showers you can use an over-the-counter cast cover or wrap a washcloth around the top of your dressing and then cover it with a plastic bag and tape it to your leg. 4. DO NOT soak the incision (no tubs, pools, bath, etc.) until you have approval from Dr. Graciemae Delisle.  Contact Dr. Adairs office or go to Emergency Room if: 1. Temperature above 101 F. 2. Increasing pain that is unresponsive to pain  medication or elevation 3. Excessive redness or swelling in your foot 4. Dressing problems - excessive bloody drainage, looseness or tightness, or if dressing gets wet 5. Develop pain, swelling, warmth, or discoloration of your calf  

## 2020-12-05 NOTE — Anesthesia Preprocedure Evaluation (Addendum)
Anesthesia Evaluation  Patient identified by MRN, date of birth, ID band Patient awake    Reviewed: Allergy & Precautions, Patient's Chart, lab work & pertinent test results  Airway Mallampati: II  TM Distance: >3 FB Neck ROM: Full    Dental  (+) Teeth Intact   Pulmonary neg pulmonary ROS,    Pulmonary exam normal        Cardiovascular hypertension,  Rhythm:Regular Rate:Normal     Neuro/Psych Anxiety negative neurological ROS     GI/Hepatic Neg liver ROS, GERD  Medicated and Controlled,  Endo/Other  negative endocrine ROS  Renal/GU negative Renal ROS   PCOS    Musculoskeletal Right lisfranc fracture   Abdominal (+)  Abdomen: soft. Bowel sounds: normal.  Peds  Hematology  (+) anemia ,   Anesthesia Other Findings   Reproductive/Obstetrics                           Anesthesia Physical Anesthesia Plan  ASA: II  Anesthesia Plan: General and Regional   Post-op Pain Management:    Induction: Intravenous  PONV Risk Score and Plan: 3 and Ondansetron, Midazolam, Dexamethasone and Treatment may vary due to age or medical condition  Airway Management Planned: Mask and Oral ETT  Additional Equipment: None  Intra-op Plan:   Post-operative Plan: Extubation in OR  Informed Consent: I have reviewed the patients History and Physical, chart, labs and discussed the procedure including the risks, benefits and alternatives for the proposed anesthesia with the patient or authorized representative who has indicated his/her understanding and acceptance.     Dental advisory given  Plan Discussed with: CRNA  Anesthesia Plan Comments: (Lab Results      Component                Value               Date                      WBC                      10.1                10/27/2020                HGB                      13.4                10/27/2020                HCT                      40.3                 10/27/2020                MCV                      91.4                10/27/2020                PLT                      276  10/27/2020           Lab Results      Component                Value               Date                      NA                       139                 10/27/2020                K                        4.4                 10/27/2020                CO2                      25                  10/27/2020                GLUCOSE                  88                  10/27/2020                BUN                      9                   10/27/2020                CREATININE               0.69                10/27/2020                CALCIUM                  8.9                 10/27/2020                GFRNONAA                 >60                 10/27/2020                GFRAA                    >60                 12/01/2018           Lab Results      Component                Value               Date  PREGTESTUR               NEGATIVE            12/01/2018                PREGSERUM                NEGATIVE            02/07/2011                HCG                      <5.0                10/27/2020          )       Anesthesia Quick Evaluation

## 2020-12-06 ENCOUNTER — Encounter (HOSPITAL_COMMUNITY): Payer: Self-pay | Admitting: Orthopaedic Surgery

## 2020-12-06 MED FILL — PANTOPRAZOLE SOD DR 40 MG T: 40 | 90 days supply | Qty: 90 | Fill #0

## 2020-12-07 DIAGNOSIS — Z9889 Other specified postprocedural states: Secondary | ICD-10-CM | POA: Diagnosis not present

## 2020-12-08 DIAGNOSIS — Z3040 Encounter for surveillance of contraceptives, unspecified: Secondary | ICD-10-CM | POA: Diagnosis not present

## 2020-12-08 DIAGNOSIS — Z01419 Encounter for gynecological examination (general) (routine) without abnormal findings: Secondary | ICD-10-CM | POA: Diagnosis not present

## 2020-12-08 DIAGNOSIS — Z30432 Encounter for removal of intrauterine contraceptive device: Secondary | ICD-10-CM | POA: Diagnosis not present

## 2020-12-08 LAB — HM PAP SMEAR: HM Pap smear: NORMAL

## 2020-12-20 ENCOUNTER — Other Ambulatory Visit: Payer: Self-pay | Admitting: Physician Assistant

## 2020-12-20 ENCOUNTER — Other Ambulatory Visit: Payer: Self-pay | Admitting: Neurology

## 2020-12-20 DIAGNOSIS — F419 Anxiety disorder, unspecified: Secondary | ICD-10-CM

## 2020-12-20 DIAGNOSIS — Z638 Other specified problems related to primary support group: Secondary | ICD-10-CM

## 2020-12-20 DIAGNOSIS — R4589 Other symptoms and signs involving emotional state: Secondary | ICD-10-CM

## 2020-12-20 MED ORDER — ESCITALOPRAM OXALATE 20 MG PO TABS: 20.0000 mg | ORAL_TABLET | Freq: Every day | ORAL | 0 refills | Status: DC

## 2021-01-29 ENCOUNTER — Other Ambulatory Visit: Payer: Self-pay | Admitting: Physician Assistant

## 2021-01-29 DIAGNOSIS — R4589 Other symptoms and signs involving emotional state: Secondary | ICD-10-CM

## 2021-01-29 DIAGNOSIS — Z638 Other specified problems related to primary support group: Secondary | ICD-10-CM

## 2021-01-29 DIAGNOSIS — F419 Anxiety disorder, unspecified: Secondary | ICD-10-CM

## 2021-02-17 ENCOUNTER — Other Ambulatory Visit: Payer: Self-pay | Admitting: Physician Assistant

## 2021-02-17 DIAGNOSIS — Z638 Other specified problems related to primary support group: Secondary | ICD-10-CM

## 2021-02-17 DIAGNOSIS — F419 Anxiety disorder, unspecified: Secondary | ICD-10-CM

## 2021-02-17 DIAGNOSIS — R4589 Other symptoms and signs involving emotional state: Secondary | ICD-10-CM

## 2021-02-20 ENCOUNTER — Telehealth (INDEPENDENT_AMBULATORY_CARE_PROVIDER_SITE_OTHER): Payer: No Typology Code available for payment source | Admitting: Family Medicine

## 2021-02-20 ENCOUNTER — Encounter: Payer: Self-pay | Admitting: Family Medicine

## 2021-02-20 DIAGNOSIS — Z638 Other specified problems related to primary support group: Secondary | ICD-10-CM

## 2021-02-20 DIAGNOSIS — F419 Anxiety disorder, unspecified: Secondary | ICD-10-CM

## 2021-02-20 DIAGNOSIS — R4589 Other symptoms and signs involving emotional state: Secondary | ICD-10-CM

## 2021-02-20 MED ORDER — ESCITALOPRAM OXALATE 20 MG PO TABS: 1.0000 | ORAL_TABLET | Freq: Every day | ORAL | 2 refills | Status: DC

## 2021-02-20 NOTE — Progress Notes (Signed)
Virtual Video Visit via MyChart Note  I connected with  [name] Fuentes on 02/20/21 at  8:50 AM EDT by the video enabled telemedicine application for MyChart, and verified that I am speaking with the correct person using two identifiers.   I introduced myself as a Designer, jewellery with the practice. We discussed the limitations of evaluation and management by telemedicine and the availability of in person appointments. The patient expressed understanding and agreed to proceed.  Participating parties in this visit include: The patient and the nurse practitioner listed.  The patient is: At home I am: In the office - [facility]  Subjective:    CC: anxiety f/u  HPI: [name] Fuentes is a 30 y.o. year old female presenting today via [facility] today for anxiety f/u and Lexapro refill.  Patient states she has been doing well on her 20 mg of Lexapro since she increased the dose last November. About 2 months ago she found out she was pregnant and she stopped taking the Lexapro on her own without any weaning. States she noticed some significant depression and anxiety while she was off of it. She ended up miscarrying the baby which worsened or anxiety/depression. About 3 weeks ago she started taking her Lexapro again and reports the depression has completely resolved and anxiety is manageable. No adverse side effects of medication. She denies any chest pain, shortness of breath, GI symptoms, headaches, insomnia. She also recently started some therapy/counseling and reports that seems to be helping a lot as well. No complaints or concerns overall, just needs refill of Lexapro. She has not had to take buspar or hydroxyzine - states she doesn't really like how those make her feel so she tries not to take them.    PHQ9 SCORE ONLY 02/20/2021 09/05/2020 09/17/2019  PHQ-9 Total Score 5 6 1    GAD 7 : Generalized Anxiety Score 02/20/2021 09/05/2020 09/17/2019  Nervous, Anxious, on Edge 1 3 3    Control/stop worrying 1 3 3   Worry too much - different things 1 3 3   Trouble relaxing 3 3 3   Restless 0 3 3  Easily annoyed or irritable 3 3 3   Afraid - awful might happen 0 3 3  Total GAD 7 Score 9 21 21   Anxiety Difficulty Somewhat difficult Very difficult Somewhat difficult       Past medical history, Surgical history, Family history not pertinant except as noted below, Social history, Allergies, and medications have been entered into the medical record, reviewed, and corrections made.   Review of Systems:  All review of systems negative except what is listed in the HPI   Objective:    General:  Speaking clearly in complete sentences. Absent shortness of breath noted.   Alert and oriented x3.   Normal judgment.  Absent acute distress.   Impression and Recommendations:    1. Stress due to family tension 2. Depressed mood 3. Anxiety Patient doing well overall. Depression and anxiety well-controlled on 20 mg Lexapro. No adverse medication effects. GAD and PHQ9 improved today. Will send in refills. Routine f/u up in 3 months or sooner if needed.  - escitalopram (LEXAPRO) 20 MG tablet; Take 1 tablet (20 mg total) by mouth daily. appt for refills/final fill  Dispense: 30 tablet; Refill: 2  Follow-up if symptoms worsen or fail to improve.    I discussed the assessment and treatment plan with the patient. The patient was provided an opportunity to ask questions and all were answered. The patient agreed with  the plan and demonstrated an understanding of the instructions.   The patient was advised to call back or seek an in-person evaluation if the symptoms worsen or if the condition fails to improve as anticipated.  I provided 20 minutes of non-face-to-face interaction with this [facility] visit including intake, same-day documentation, and chart review.   Terrilyn Saver, NP

## 2021-04-20 ENCOUNTER — Ambulatory Visit (INDEPENDENT_AMBULATORY_CARE_PROVIDER_SITE_OTHER): Payer: No Typology Code available for payment source | Admitting: Family Medicine

## 2021-04-20 ENCOUNTER — Encounter: Payer: Self-pay | Admitting: Family Medicine

## 2021-04-20 ENCOUNTER — Other Ambulatory Visit: Payer: Self-pay

## 2021-04-20 DIAGNOSIS — H66002 Acute suppurative otitis media without spontaneous rupture of ear drum, left ear: Secondary | ICD-10-CM

## 2021-04-20 DIAGNOSIS — H6692 Otitis media, unspecified, left ear: Secondary | ICD-10-CM | POA: Insufficient documentation

## 2021-04-20 MED ORDER — AMOXICILLIN-POT CLAVULANATE 875-125 MG PO TABS: 1.0000 | ORAL_TABLET | Freq: Two times a day (BID) | ORAL | 0 refills | Status: DC

## 2021-04-20 NOTE — Progress Notes (Signed)
[name] Fuentes - 30 y.o. female MRN [id number]  Date of birth: [birth year]  Subjective Chief Complaint  Patient presents with   Ear Pain    HPI [name] Fuentes is a 30 y.o. female here today with complaint of L ear pain.  Initially though she had swimmers ear and has been using ciprodex.  Some improvement initially but pain worsened a couple of days ago.  She has some pain along the L side of her jaw as well.  No drainage from the ear. She has had fever up to 102 last night.  She denies body aches, respiratory symptoms, nausea, vomiting, diarrhea.  She also reports that she has stopped lexapro, buspar and hydroxyzine.  Feels great without these at this time.   ROS:  A comprehensive ROS was completed and negative except as noted per HPI  Allergies  Allergen Reactions   Other Anaphylaxis, Hives, Diarrhea and Nausea And Vomiting    Reaction to red meat   Accutane [Isotretinoin] Other (See Comments)    ? bruising   Metformin And Related Diarrhea and Nausea Only    Past Medical History:  Diagnosis Date   Allergy to alpha-gal    Anemia    hx of low iron   Anxiety    Dyspnea    on exertion    GERD (gastroesophageal reflux disease)    History of kidney stones 10/28/2019   passed    Hydradenitis    Hypertension    no longer on medication   Obese    PCOS (polycystic ovarian syndrome)    Psoriasis     Past Surgical History:  Procedure Laterality Date   ANKLE RECONSTRUCTION Left    L ankle; plates and screws in place    CHOLECYSTECTOMY N/A 12/01/2018   Procedure: LAPAROSCOPIC CHOLECYSTECTOMY WITH INTRAOPERATIVE CHOLANGIOGRAM;  Surgeon: Excell Seltzer, MD;  Location: [facility];  Service: General;  Laterality: N/A;   LAPAROSCOPIC GASTRIC SLEEVE RESECTION N/A 09/28/2018   Procedure: LAPAROSCOPIC GASTRIC SLEEVE RESECTION, UPPER ENDO, ERAS PATHWAY;  Surgeon: Excell Seltzer, MD;  Location: [facility];  Service: General;  Laterality: N/A;   OPEN REDUCTION INTERNAL FIXATION (ORIF)  FOOT LISFRANC FRACTURE Right 12/05/2020   Procedure: OPEN TREATMENT OF RIGHT LISFRANC, SECOND TARSOMETATARSAL JOINT AND SECOND METATARSAL BASE FRACTURE;  Surgeon: Erle Crocker, MD;  Location: [facility];  Service: Orthopedics;  Laterality: Right;  LENGTH OF SURGERY: 2 HOURS   Tempano Plasty     TONSILLECTOMY     and adenoidectomy   TYMPANOSTOMY TUBE PLACEMENT     several sets   WISDOM TOOTH EXTRACTION      Social History   Socioeconomic History   Marital status: Legally Separated    Spouse name: Not on file   Number of children: Not on file   Years of education: Not on file   Highest education level: Not on file  Occupational History   Not on file  Tobacco Use   Smoking status: Never   Smokeless tobacco: Never  Vaping Use   Vaping Use: Never used  Substance and Sexual Activity   Alcohol use: Yes    Comment: occ   Drug use: No   Sexual activity: Not on file  Other Topics Concern   Not on file  Social History Narrative   Not on file   Social Determinants of Health   Financial Resource Strain: Not on file  Food Insecurity: Not on file  Transportation Needs: Not on file  Physical Activity: Not on file  Stress: Not on file  Social Connections: Not on file    Family History  Problem Relation Age of Onset   Hypertension Mother    Hypertension Father    Hypertension Brother    Hypertension Maternal Grandfather    Cancer Paternal Grandfather        liver   Stroke Paternal Grandfather    Allergic rhinitis Neg Hx    Angioedema Neg Hx    Asthma Neg Hx    Eczema Neg Hx    Immunodeficiency Neg Hx    Urticaria Neg Hx     Health Maintenance  Topic Date Due   Pneumococcal Vaccine 59-34 Years old (1 - PCV) Never done   HIV Screening  Never done   Hepatitis C Screening  Never done   PAP-Cervical Cytology Screening  04/04/2019   PAP SMEAR-Modifier  04/04/2019   COVID-19 Vaccine (3 - Pfizer risk series) 01/14/2020   INFLUENZA VACCINE  06/04/2021   TETANUS/TDAP   03/13/2027   HPV VACCINES  Aged Out     ----------------------------------------------------------------------------------------------------------------------------------------------------------------------------------------------------------------- Physical Exam BP (!) 143/84 (BP Location: Left Arm, Patient Position: Sitting, Cuff Size: Large)   Pulse 89   Temp 97.8 F (36.6 C)   Ht 5\' 2"  (1.575 m)   Wt (!) 312 lb (141.5 kg)   SpO2 98%   BMI 57.07 kg/m   Physical Exam Constitutional:      Appearance: Normal appearance.  HENT:     Head: Normocephalic and atraumatic.     Right Ear: Tympanic membrane normal.     Ears:     Comments: L TM is injected with milky appearing fluid posterior to TM Eyes:     General: No scleral icterus. Cardiovascular:     Rate and Rhythm: Normal rate and regular rhythm.  Pulmonary:     Effort: Pulmonary effort is normal.     Breath sounds: Normal breath sounds.  Lymphadenopathy:     Cervical: Cervical adenopathy (L submandibular node, tender) present.  Neurological:     General: No focal deficit present.     Mental Status: She is alert.  Psychiatric:        Mood and Affect: Mood normal.    ------------------------------------------------------------------------------------------------------------------------------------------------------------------------------------------------------------------- Assessment and Plan  Left otitis media Start augmentin x 10 days.  Increased fluid recommended. May continue ibuprofen/acetaminophen as needed for pain control.  Contact clinic if symptoms are not improving or if she has worsening.     Meds ordered this encounter  Medications   amoxicillin-clavulanate (AUGMENTIN) 875-125 MG tablet    Sig: Take 1 tablet by mouth 2 (two) times daily.    Dispense:  20 tablet    Refill:  0    No follow-ups on file.    This visit occurred during the SARS-CoV-2 public health emergency.  Safety protocols were  in place, including screening questions prior to the visit, additional usage of staff PPE, and extensive cleaning of exam room while observing appropriate contact time as indicated for disinfecting solutions.

## 2021-04-20 NOTE — Patient Instructions (Signed)
Otitis Media, Adult  Otitis media is a condition in which the middle ear is red and swollen (inflamed) and full of fluid. The middle ear is the part of the ear that contains bones for hearing as well as air that helps send sounds to the brain. The conditionusually goes away on its own. What are the causes? This condition is caused by a blockage in the eustachian tube. The eustachian tube connects the middle ear to the back of the nose. It normally allows air into the middle ear. The blockage is caused by fluid or swelling. Problems that can cause blockage include: A cold or infection that affects the nose, mouth, or throat. Allergies. An irritant, such as tobacco smoke. Adenoids that have become large. The adenoids are soft tissue located in the back of the throat, behind the nose and the roof of the mouth. Growth or swelling in the upper part of the throat, just behind the nose (nasopharynx). Damage to the ear caused by change in pressure. This is called barotrauma. What are the signs or symptoms? Symptoms of this condition include: Ear pain. Fever. Problems with hearing. Being tired. Fluid leaking from the ear. Ringing in the ear. How is this treated? This condition can go away on its own within 3-5 days. But if the condition is caused by bacteria or does not go away on its own, or if it keeps coming back, your doctor may: Give you antibiotic medicines. Give you medicines for pain. Follow these instructions at home: Take over-the-counter and prescription medicines only as told by your doctor. If you were prescribed an antibiotic medicine, take it as told by your doctor. Do not stop taking the antibiotic even if you start to feel better. Keep all follow-up visits as told by your doctor. This is important. Contact a doctor if: You have bleeding from your nose. There is a lump on your neck. You are not feeling better in 5 days. You feel worse instead of better. Get help right away  if: You have pain that is not helped with medicine. You have swelling, redness, or pain around your ear. You get a stiff neck. You cannot move part of your face (paralysis). You notice that the bone behind your ear hurts when you touch it. You get a very bad headache. Summary Otitis media means that the middle ear is red, swollen, and full of fluid. This condition usually goes away on its own. If the problem does not go away, treatment may be needed. You may be given medicines to treat the infection or to treat your pain. If you were prescribed an antibiotic medicine, take it as told by your doctor. Do not stop taking the antibiotic even if you start to feel better. Keep all follow-up visits as told by your doctor. This is important. This information is not intended to replace advice given to you by your health care provider. Make sure you discuss any questions you have with your healthcare provider. Document Revised: 09/23/2019 Document Reviewed: 09/23/2019 Elsevier Patient Education  2022 [employer].

## 2021-04-20 NOTE — Assessment & Plan Note (Signed)
Start augmentin x 10 days.  Increased fluid recommended. May continue ibuprofen/acetaminophen as needed for pain control.  Contact clinic if symptoms are not improving or if she has worsening.

## 2021-04-25 ENCOUNTER — Encounter: Payer: Self-pay | Admitting: Family Medicine

## 2021-04-25 ENCOUNTER — Encounter (HOSPITAL_COMMUNITY): Payer: Self-pay | Admitting: *Deleted

## 2021-04-25 ENCOUNTER — Other Ambulatory Visit: Payer: Self-pay | Admitting: Family Medicine

## 2021-04-25 MED ORDER — FLUCONAZOLE 150 MG PO TABS: 150.0000 mg | ORAL_TABLET | Freq: Once | ORAL | 0 refills | Status: AC

## 2021-07-23 ENCOUNTER — Other Ambulatory Visit: Payer: Self-pay | Admitting: Family Medicine

## 2021-08-22 ENCOUNTER — Other Ambulatory Visit: Payer: Self-pay | Admitting: Physician Assistant

## 2021-08-22 DIAGNOSIS — K219 Gastro-esophageal reflux disease without esophagitis: Secondary | ICD-10-CM

## 2021-08-31 ENCOUNTER — Other Ambulatory Visit: Payer: Self-pay | Admitting: Neurology

## 2021-08-31 MED ORDER — ESCITALOPRAM OXALATE 20 MG PO TABS: 20.0000 mg | ORAL_TABLET | Freq: Every day | ORAL | 0 refills | Status: DC

## 2021-09-30 ENCOUNTER — Other Ambulatory Visit: Payer: Self-pay | Admitting: Physician Assistant

## 2021-10-03 NOTE — Telephone Encounter (Signed)
Patient is scheduled on 12/5 with [facility]

## 2021-10-03 NOTE — Telephone Encounter (Signed)
Please call pt to schedule appt.  No further refills until pt is seen.  T. Diron Haddon, CMA  

## 2021-10-08 ENCOUNTER — Encounter: Payer: Self-pay | Admitting: Physician Assistant

## 2021-10-08 ENCOUNTER — Telehealth (INDEPENDENT_AMBULATORY_CARE_PROVIDER_SITE_OTHER): Payer: No Typology Code available for payment source | Admitting: Physician Assistant

## 2021-10-08 VITALS — HR 86 | Temp 98.7°F

## 2021-10-08 DIAGNOSIS — Z20828 Contact with and (suspected) exposure to other viral communicable diseases: Secondary | ICD-10-CM | POA: Diagnosis not present

## 2021-10-08 DIAGNOSIS — R4589 Other symptoms and signs involving emotional state: Secondary | ICD-10-CM | POA: Diagnosis not present

## 2021-10-08 DIAGNOSIS — F902 Attention-deficit hyperactivity disorder, combined type: Secondary | ICD-10-CM | POA: Diagnosis not present

## 2021-10-08 DIAGNOSIS — F419 Anxiety disorder, unspecified: Secondary | ICD-10-CM | POA: Diagnosis not present

## 2021-10-08 MED ORDER — AMPHETAMINE-DEXTROAMPHET ER 10 MG PO CP24
10.0000 mg | ORAL_CAPSULE | Freq: Every day | ORAL | 0 refills | Status: DC
Start: 2021-10-08 — End: 2021-11-07

## 2021-10-08 MED ORDER — ESCITALOPRAM OXALATE 20 MG PO TABS: 20.0000 mg | ORAL_TABLET | Freq: Every day | ORAL | 3 refills | Status: DC

## 2021-10-08 MED ORDER — OSELTAMIVIR PHOSPHATE 75 MG PO CAPS: 75.0000 mg | ORAL_CAPSULE | Freq: Every day | ORAL | 0 refills | Status: DC

## 2021-10-08 NOTE — Progress Notes (Signed)
..Virtual Visit via Video Note  I connected with [name] Fuentes on 10/08/21 at  9:50 AM EST by a video enabled telemedicine application and verified that I am speaking with the correct person using two identifiers.  Location: Patient: home Provider: clinic  .Marland KitchenParticipating in visit:  Patient: [name] Fuentes Provider: Iran Planas PA-C Provider in training: [name] Laws PA-S   I discussed the limitations of evaluation and management by telemedicine and the availability of in person appointments. The patient expressed understanding and agreed to proceed.  History of Present Illness: Patient is a 30 year old female with depression, anxiety, ADHD who presents to the clinic for medication refills.  Overall she feels like the Lexapro is helping a lot with her anxiety and depression.  She denies any significant side effects.  She denies any suicidal thoughts or homicidal idealizations.  She does feel like her focus is not very good.  She is working 2 jobs and just struggling to complete task.  She was diagnosed with ADHD as a child and was on Vyvanse.  She has not been on Vyvanse in a while.  She would like to try some type of stimulant to help with her focus.  Her daughter was just dx with flu yesterday. She is asymptomatic but would like not to get it.    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   Otalgia, right 10/18/2018   Anxiety 09/20/2019   Panic attacks 09/20/2019   History of frequent upper respiratory infection 05/03/2020   Chronic  rhinitis 05/03/2020   Adverse food reaction 05/03/2020   Allergic reaction 05/04/2020   Drug reaction 05/04/2020   Heartburn 05/04/2020   Depressed mood 08/07/2020   Stress due to family tension 08/07/2020   Left otitis media 04/20/2021   Attention deficit hyperactivity disorder (ADHD), combined type 10/08/2021   Resolved Ambulatory Problems    Diagnosis Date Noted   Body aches 04/27/2018   Morbid obesity with BMI of 60.0-69.9, adult ([place]) 09/28/2018   Shortness of breath 05/03/2020   Past Medical History:  Diagnosis Date   Allergy to alpha-gal    Anemia    Dyspnea    GERD (gastroesophageal reflux disease)    History of kidney stones 10/28/2019   Hydradenitis    Hypertension    Obese      Observations/Objective: No acute distress Normal appearance and mood. Normal breathing  .Marland Kitchen Today's Vitals   10/08/21 1002  Pulse: 86  Temp: 98.7 F (37.1 C)   There is no height or weight on file to calculate BMI.   .. Depression screen [facility] 2/9 10/08/2021 02/20/2021 09/05/2020 09/17/2019 05/03/2019  Decreased Interest 0 0 0 0 0  Down, Depressed, Hopeless 0 0 0 0 0  PHQ - 2 Score 0 0 0 0 0  Altered sleeping 1 1 1 1  -  Tired, decreased energy 1 0 1 0 -  Change in appetite 1 0 0 0 -  Feeling bad or failure about yourself  1 1 0 0 -  Trouble concentrating 3 3 1  0 -  Moving slowly or fidgety/restless 0 0 3 0 -  Suicidal thoughts 0 0 0 0 -  PHQ-9 Score 7 5 6 1  -  Difficult doing work/chores Very difficult Not difficult at all Not difficult at all Not difficult at all -   .. GAD 7 : Generalized Anxiety Score 10/08/2021 02/20/2021 09/05/2020 09/17/2019  Nervous, Anxious, on Edge 2 1 3 3   Control/stop worrying 1 1 3 3   Worry too much - different things 1 1 3 3   Trouble relaxing 2 3 3 3   Restless 2 0 3 3  Easily annoyed or irritable 2 3 3 3   Afraid - awful might happen 1 0 3 3  Total GAD 7 Score 11 9 21 21   Anxiety Difficulty Somewhat difficult Somewhat difficult Very difficult  Somewhat difficult     Assessment and Plan: Marland KitchenMarland KitchenMadison was seen today for depression and anxiety.  Diagnoses and all orders for this visit:  Depressed mood -     escitalopram (LEXAPRO) 20 MG tablet; Take 1 tablet (20 mg total) by mouth daily.  Anxiety -     escitalopram (LEXAPRO) 20 MG tablet; Take 1 tablet (20 mg total) by mouth daily.  Attention deficit hyperactivity disorder (ADHD), combined type -     amphetamine-dextroamphetamine (ADDERALL XR) 10 MG 24 hr capsule; Take 1 capsule (10 mg total) by mouth daily.  Exposure to the flu -     oseltamivir (TAMIFLU) 75 MG capsule; Take 1 capsule (75 mg total) by mouth daily.  PHQ/GAD up a little but patient feels like her anxiety/depression is controlled and more due to her focus.  Refilled lexapro 20mg .  ADHD since child. Started adderall 10mg  daily.  Follow up in 1 month.   Exposure to flu starte tamiflu prophylaxis.    Follow Up Instructions:    I discussed the assessment and treatment plan with the patient. The patient was provided an opportunity to ask questions and all were answered. The patient agreed with the plan and demonstrated an understanding of the instructions.   The patient was advised to call back or seek an in-person evaluation if the symptoms worsen or if the condition fails to improve as anticipated.   Iran Planas, PA-C

## 2021-10-24 ENCOUNTER — Encounter: Payer: Self-pay | Admitting: Physician Assistant

## 2021-10-26 ENCOUNTER — Other Ambulatory Visit (HOSPITAL_COMMUNITY): Payer: Self-pay

## 2021-10-26 MED ORDER — AMPHETAMINE-DEXTROAMPHET ER 15 MG PO CP24
15.0000 mg | ORAL_CAPSULE | ORAL | 0 refills | Status: DC
  Filled 2021-10-26: qty 30, 30d supply, fill #0

## 2021-11-05 ENCOUNTER — Other Ambulatory Visit (HOSPITAL_COMMUNITY): Payer: Self-pay

## 2021-11-06 ENCOUNTER — Other Ambulatory Visit (HOSPITAL_COMMUNITY): Payer: Self-pay

## 2021-11-07 ENCOUNTER — Other Ambulatory Visit (HOSPITAL_COMMUNITY): Payer: Self-pay

## 2021-11-07 MED ORDER — AMPHETAMINE-DEXTROAMPHET ER 15 MG PO CP24: 15.0000 mg | ORAL_CAPSULE | ORAL | 0 refills | Status: DC

## 2021-11-07 NOTE — Addendum Note (Signed)
Addended by: Donella Stade on: 11/07/2021 09:28 AM   Modules accepted: Orders

## 2021-11-07 NOTE — Telephone Encounter (Signed)
[place] and spoke with Junie Panning and discontinued RX on file at their pharmacy.  Pt wants RX for Adderall 15mg  sent to [facility].  Charyl Bigger, CMA

## 2021-12-03 ENCOUNTER — Other Ambulatory Visit: Payer: Self-pay | Admitting: Physician Assistant

## 2021-12-03 NOTE — Addendum Note (Signed)
Addended by: Narda Rutherford on: 12/03/2021 10:46 AM   Modules accepted: Orders

## 2021-12-04 MED ORDER — AMPHETAMINE-DEXTROAMPHET ER 15 MG PO CP24: 15.0000 mg | ORAL_CAPSULE | ORAL | 0 refills | Status: DC

## 2021-12-04 NOTE — Addendum Note (Signed)
Addended byDonella Stade on: 12/04/2021 01:50 PM   Modules accepted: Orders

## 2022-02-20 ENCOUNTER — Telehealth: Payer: Self-pay | Admitting: General Practice

## 2022-02-20 NOTE — Telephone Encounter (Signed)
Transition Care Management Unsuccessful Follow-up Telephone Call ? ?Date of discharge and from where:  02/19/22 from [facility] med center ? ?Attempts:  1st Attempt ? ?Reason for unsuccessful TCM follow-up call:  Left voice message ? ?  ?

## 2022-02-22 NOTE — Telephone Encounter (Signed)
Transition Care Management Follow-up Telephone Call ?Date of discharge and from where: 02/19/22 from [facility] ?How have you been since you were released from the hospital? States she is doing ok. She had fall and dislocated her knee. She is scheduled for her MRI today then they will schedule a surgery depending on results. ?Any questions or concerns? No ? ?Items Reviewed: ?Did the pt receive and understand the discharge instructions provided? Yes  ?Medications obtained and verified? Yes  ?Other? No  ?Any new allergies since your discharge? No  ?Dietary orders reviewed? Yes ?Do you have support at home? Yes  ? ?Home Care and Equipment/Supplies: ?Were home health services ordered? no ? ?Functional Questionnaire: (I = Independent and D = Dependent) ?ADLs: I ? ?Bathing/Dressing- I ? ?Meal Prep- I ? ?Eating- I ? ?Maintaining continence- I ? ?Transferring/Ambulation- I ? ?Managing Meds- I ? ?Follow up appointments reviewed: ? ?PCP Hospital f/u appt confirmed? No   ?Specialist Hospital f/u appt confirmed? Yes  Has seen her orthopedic. Scheduled for an MRI today to determine when her surgery will be. ?Are transportation arrangements needed? No  ?If their condition worsens, is the pt aware to call PCP or go to the Emergency Dept.? Yes ?Was the patient provided with contact information for the PCP's office or ED? Yes ?Was to pt encouraged to call back with questions or concerns? Yes  ?

## 2022-03-04 ENCOUNTER — Other Ambulatory Visit: Payer: Self-pay | Admitting: Physician Assistant

## 2022-03-05 MED ORDER — AMPHETAMINE-DEXTROAMPHET ER 15 MG PO CP24: 15.0000 mg | ORAL_CAPSULE | ORAL | 0 refills | Status: DC

## 2022-03-05 NOTE — Telephone Encounter (Signed)
LVM for patient to call back to get appt scheduled with [facility] for any further med refills. AMUCK ?

## 2022-03-05 NOTE — Telephone Encounter (Signed)
Per provider's request - patient will need an appointment before next refill request. Please contact the patient to schedule an appointment to follow on controlled rx - Adderall. Thanks in advance.  ?

## 2022-03-05 NOTE — Telephone Encounter (Signed)
Will need follow up after this refill due to controlled substance laws.

## 2022-03-15 ENCOUNTER — Telehealth (INDEPENDENT_AMBULATORY_CARE_PROVIDER_SITE_OTHER): Payer: No Typology Code available for payment source | Admitting: Physician Assistant

## 2022-03-15 ENCOUNTER — Encounter: Payer: Self-pay | Admitting: Physician Assistant

## 2022-03-15 DIAGNOSIS — R748 Abnormal levels of other serum enzymes: Secondary | ICD-10-CM | POA: Diagnosis not present

## 2022-03-15 DIAGNOSIS — F902 Attention-deficit hyperactivity disorder, combined type: Secondary | ICD-10-CM

## 2022-03-15 DIAGNOSIS — W19XXXD Unspecified fall, subsequent encounter: Secondary | ICD-10-CM | POA: Diagnosis not present

## 2022-03-15 DIAGNOSIS — S83104S Unspecified dislocation of right knee, sequela: Secondary | ICD-10-CM | POA: Diagnosis not present

## 2022-03-15 MED ORDER — AMPHETAMINE-DEXTROAMPHET ER 15 MG PO CP24: 15.0000 mg | ORAL_CAPSULE | ORAL | 0 refills | Status: DC

## 2022-03-15 NOTE — Progress Notes (Signed)
..Virtual Visit via Telephone Note ? ?I connected with [name] Fuentes on 03/18/22 at  1:00 PM EDT by telephone and verified that I am speaking with the correct person using two identifiers. ? ?Location: ?Patient: home ?Provider: Clinic ? ?Marland Kitchen.Participating in visit:  ?Patient: [name] Fuentes ?Provider: Iran Planas PA-C ?  ?I discussed the limitations, risks, security and privacy concerns of performing an evaluation and management service by telephone and the availability of in person appointments. I also discussed with the patient that there may be a patient responsible charge related to this service. The patient expressed understanding and agreed to proceed. ? ? ?History of Present Illness: ?Pt is a 31 yo female who calls into the clinic for medication refills and to follow up on elevated liver enzymes.  ? ?On 4/17 her dog got scared and ran into her knees and she passed out and fell straight forward. She had a dislocation of the right knee, both front teeth were cracked and a fracture of thoracic vertebra. She is following up with ortho. Plan is 12 weeks of PT and staying in knee brace then consider surgery. While in hospital her liver enzymes were found to be very elevated at AST 297, ALT 147, APO 111, GGT was 114. She denies any regular alcohol intake. She occasionally smokes marijuana. She has had cholecystectomy. CT did not show any abnormality. She has had intermittent right upper quadrant abdominal pain since her surgery. It does not last long and then goes away. No bowel changes. No jaundice or yellowing of eyes or skin. No nausea, vomiting, fever, body aches, chills.  ? ?Needs Adderall refilled. Doing well on it. At times feels like not working as well. Depression and anxiety doing well. Buspar did not work. She did not like the way she felt on it.  ? ?.. ?Active Ambulatory Problems  ?  Diagnosis Date Noted  ? Psoriasis 03/12/2017  ? PCOS (polycystic ovarian syndrome) 03/12/2017  ? Hypertension goal BP (blood  pressure) < 130/80 03/12/2017  ? Elevated blood pressure reading 03/12/2017  ? Morbid obesity ([place]) 03/12/2017  ? Gastroesophageal reflux disease without esophagitis 03/12/2017  ? Elevated TSH 04/27/2018  ? Elevated liver enzymes 04/28/2018  ? 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 Otalgia, right 10/18/2018  ? Anxiety 09/20/2019  ? Panic attacks 09/20/2019  ? History of frequent upper respiratory infection 05/03/2020  ? Chronic rhinitis 05/03/2020  ? Adverse food reaction 05/03/2020  ? Allergic reaction 05/04/2020  ? Drug reaction 05/04/2020  ? Heartburn 05/04/2020  ? Depressed mood 08/07/2020  ? Stress due to family tension 08/07/2020  ? Left otitis media 04/20/2021  ? Attention deficit hyperactivity disorder (ADHD), combined type 10/08/2021  ? Knee dislocation, right, sequela 03/15/2022  ? ?Resolved Ambulatory Problems  ?  Diagnosis Date Noted  ? Body aches 04/27/2018  ? Morbid obesity with BMI of 60.0-69.9, adult ([place]) 09/28/2018  ? Shortness of breath 05/03/2020  ? ?Past Medical History:  ?Diagnosis Date  ? Allergy to alpha-gal   ? Anemia   ? Dyspnea   ? GERD (gastroesophageal reflux disease)   ? History of kidney stones 10/28/2019  ? Hydradenitis   ? Hypertension   ? Obese   ? ? ? ?  ?Observations/Objective: ?No acute distress ?Normal mood  ?No labored breathing ? ?Marland Kitchen.There were no vitals filed for this visit. ?There is no height or weight on file to calculate BMI. ? ?.. ? ?  10/08/2021  ?  9:55 AM 02/20/2021  ?  9:04 AM 09/05/2020  ?  8:52 AM 09/17/2019  ?  3:08 PM 05/03/2019  ?  6:28 AM  ?Depression screen PHQ 2/9  ?Decreased Interest 0 0 0 0 0  ?Down, Depressed, Hopeless 0 0 0 0 0  ?PHQ - 2 Score 0 0 0 0 0  ?Altered sleeping '1 1 1 1   '$ ?Tired, decreased energy 1 0 1 0   ?Change in appetite 1 0 0 0   ?Feeling bad or failure about yourself  1 1 0 0   ?Trouble concentrating '3 3 1 '$ 0   ?Moving slowly or fidgety/restless 0 0 3 0   ?Suicidal thoughts 0 0 0 0    ?PHQ-9 Score '7 5 6 1   '$ ?Difficult doing work/chores Very difficult Not difficult at all Not difficult at all Not difficult at all   ? ?.. ? ?  10/08/2021  ?  9:58 AM 02/20/2021  ?  9:03 AM 09/05/2020  ?  8:52 AM 09/17/2019  ?  3:10 PM  ?GAD 7 : Generalized Anxiety Score  ?Nervous, Anxious, on Edge '2 1 3 3  '$ ?Control/stop worrying '1 1 3 3  '$ ?Worry too much - different things '1 1 3 3  '$ ?Trouble relaxing '2 3 3 3  '$ ?Restless 2 0 3 3  ?Easily annoyed or irritable '2 3 3 3  '$ ?Afraid - awful might happen 1 0 3 3  ?Total GAD 7 Score '11 9 21 21  '$ ?Anxiety Difficulty Somewhat difficult Somewhat difficult Very difficult Somewhat difficult  ? ? ? ? ? ?Assessment and Plan: ?..[name] Fuentes was seen today for medication refill. ? ?Diagnoses and all orders for this visit: ? ?Attention deficit hyperactivity disorder (ADHD), combined type ?-     amphetamine-dextroamphetamine (ADDERALL XR) 15 MG 24 hr capsule; Take 1 capsule by mouth every morning. ?-     amphetamine-dextroamphetamine (ADDERALL XR) 15 MG 24 hr capsule; Take 1 capsule by mouth every morning. ?-     amphetamine-dextroamphetamine (ADDERALL XR) 15 MG 24 hr capsule; Take 1 capsule by mouth every morning. ? ?Knee dislocation, right, sequela ? ?Elevated liver enzymes ?-     Hepatic function panel ?-     CBC with Differential/Platelet ?-     Hepatitis panel, acute ?-     AntiMicrosomal Ab-Liver / Kidney ? ?Fall, subsequent encounter ? ? ?Continue to follow up with ortho for knee management.  ? ?Recheck liver enzymes. Reassuring CT looked good. Hopefully more viral acute elevation of enzymes. ?Will follow closely.  ? ?Continue adderall for focus and lexapro for anxiety and depression ? ?Follow up in 3 months for follow up. ? ? ?Follow Up Instructions: ? ?  ?I discussed the assessment and treatment plan with the patient. The patient was provided an opportunity to ask questions and all were answered. The patient agreed with the plan and demonstrated an understanding of the instructions. ?   ?The patient was advised to call back or seek an in-person evaluation if the symptoms worsen or if the condition fails to improve as anticipated. ? ?I provided 15 minutes of non-face-to-face time during this encounter. ? ? ?Iran Planas, PA-C ? ?

## 2022-03-20 NOTE — Progress Notes (Signed)
Completely negative hepatitis panel.

## 2022-03-20 NOTE — Progress Notes (Signed)
GREAT News your liver enzymes completely normalized.  ?I did order some extra liver testing that is not back yet but all the labs that were very elevated in ED have normalized.

## 2022-03-23 LAB — CBC WITH DIFFERENTIAL/PLATELET
Absolute Monocytes: 403 cells/uL (ref 200–950)
Basophils Absolute: 40 cells/uL (ref 0–200)
Basophils Relative: 0.6 %
Eosinophils Absolute: 132 cells/uL (ref 15–500)
Eosinophils Relative: 2 %
HCT: 39.3 % (ref 35.0–45.0)
Hemoglobin: 13.2 g/dL (ref 11.7–15.5)
Lymphs Abs: 1894 cells/uL (ref 850–3900)
MCH: 29.8 pg (ref 27.0–33.0)
MCHC: 33.6 g/dL (ref 32.0–36.0)
MCV: 88.7 fL (ref 80.0–100.0)
MPV: 10.1 fL (ref 7.5–12.5)
Monocytes Relative: 6.1 %
Neutro Abs: 4132 cells/uL (ref 1500–7800)
Neutrophils Relative %: 62.6 %
Platelets: 302 10*3/uL (ref 140–400)
RBC: 4.43 10*6/uL (ref 3.80–5.10)
RDW: 12.9 % (ref 11.0–15.0)
Total Lymphocyte: 28.7 %
WBC: 6.6 10*3/uL (ref 3.8–10.8)

## 2022-03-23 LAB — HEPATIC FUNCTION PANEL
AG Ratio: 1.5 (calc) (ref 1.0–2.5)
ALT: 20 U/L (ref 6–29)
AST: 18 U/L (ref 10–30)
Albumin: 4.1 g/dL (ref 3.6–5.1)
Alkaline phosphatase (APISO): 60 U/L (ref 31–125)
Bilirubin, Direct: 0.1 mg/dL (ref 0.0–0.2)
Globulin: 2.7 g/dL (calc) (ref 1.9–3.7)
Indirect Bilirubin: 0.4 mg/dL (calc) (ref 0.2–1.2)
Total Bilirubin: 0.5 mg/dL (ref 0.2–1.2)
Total Protein: 6.8 g/dL (ref 6.1–8.1)

## 2022-03-23 LAB — ANTI-MICROSOMAL ANTIBODY LIVER / KIDNEY: LKM1 Ab: <=20 U (ref <=20.0)

## 2022-03-23 LAB — HEPATITIS PANEL, ACUTE
Hep A IgM: NONREACTIVE
Hep B C IgM: NONREACTIVE
Hepatitis B Surface Ag: NONREACTIVE
Hepatitis C Ab: NONREACTIVE
SIGNAL TO CUT-OFF: 0.12 (ref <1.00)

## 2022-03-25 NOTE — Progress Notes (Signed)
All liver labs have resulted and normal.

## 2022-04-15 ENCOUNTER — Encounter: Payer: Self-pay | Admitting: Physician Assistant

## 2022-04-15 DIAGNOSIS — F902 Attention-deficit hyperactivity disorder, combined type: Secondary | ICD-10-CM

## 2022-04-15 MED ORDER — AMPHETAMINE-DEXTROAMPHET ER 15 MG PO CP24: 15.0000 mg | ORAL_CAPSULE | ORAL | 0 refills | Status: DC

## 2022-04-24 ENCOUNTER — Encounter (HOSPITAL_COMMUNITY): Payer: Self-pay | Admitting: *Deleted

## 2022-05-20 ENCOUNTER — Encounter: Payer: Self-pay | Admitting: Physician Assistant

## 2022-05-20 DIAGNOSIS — F902 Attention-deficit hyperactivity disorder, combined type: Secondary | ICD-10-CM

## 2022-05-20 MED ORDER — AMPHETAMINE-DEXTROAMPHET ER 15 MG PO CP24: 15.0000 mg | ORAL_CAPSULE | ORAL | 0 refills | Status: DC

## 2022-05-20 NOTE — Telephone Encounter (Signed)
Spoke with Angie at [facility] in [facility] and cancelled RX with start date of 05/15/2022.  She confirmed this RX had not been filled yet.  Charyl Bigger, CMA

## 2022-09-25 ENCOUNTER — Telehealth: Payer: Self-pay | Admitting: General Practice

## 2022-09-25 NOTE — Telephone Encounter (Signed)
Transition Care Management Unsuccessful Follow-up Telephone Call  Date of discharge and from where:  09/23/22 from [facility]  Attempts:  1st Attempt  Reason for unsuccessful TCM follow-up call:  Unable to reach patient

## 2022-09-30 NOTE — Telephone Encounter (Signed)
Transition Care Management Unsuccessful Follow-up Telephone Call  Date of discharge and from where:  09/23/22 from [facility]  Attempts:  2nd Attempt  Reason for unsuccessful TCM follow-up call:  Unable to reach patient

## 2022-10-02 NOTE — Telephone Encounter (Signed)
Transition Care Management Unsuccessful Follow-up Telephone Call  Date of discharge and from where:  09/23/22 from [facility]  Attempts:  3rd Attempt  Reason for unsuccessful TCM follow-up call:  Unable to reach patient

## 2022-10-05 ENCOUNTER — Other Ambulatory Visit: Payer: Self-pay | Admitting: Physician Assistant

## 2022-10-05 DIAGNOSIS — F902 Attention-deficit hyperactivity disorder, combined type: Secondary | ICD-10-CM

## 2022-10-05 DIAGNOSIS — K219 Gastro-esophageal reflux disease without esophagitis: Secondary | ICD-10-CM

## 2022-10-07 MED ORDER — AMPHETAMINE-DEXTROAMPHET ER 15 MG PO CP24: 15.0000 mg | ORAL_CAPSULE | ORAL | 0 refills | Status: DC

## 2022-10-07 NOTE — Telephone Encounter (Signed)
Last RX written 05/20/2022 #30 no refills  Last appt 03/15/2022 Told to follow up in 3 months  Please call patient to schedule follow up appt.

## 2022-10-07 NOTE — Telephone Encounter (Signed)
Patient scheduled, please advise.

## 2022-10-07 NOTE — Telephone Encounter (Signed)
Patient scheduled for 12/12 for medication refill!

## 2022-10-15 ENCOUNTER — Ambulatory Visit (INDEPENDENT_AMBULATORY_CARE_PROVIDER_SITE_OTHER): Payer: Self-pay | Admitting: Family Medicine

## 2022-10-15 ENCOUNTER — Encounter: Payer: Self-pay | Admitting: Family Medicine

## 2022-10-15 VITALS — BP 124/86 | HR 92 | Ht 62.0 in | Wt 312.1 lb

## 2022-10-15 DIAGNOSIS — F410 Panic disorder [episodic paroxysmal anxiety] without agoraphobia: Secondary | ICD-10-CM

## 2022-10-15 DIAGNOSIS — F902 Attention-deficit hyperactivity disorder, combined type: Secondary | ICD-10-CM

## 2022-10-15 DIAGNOSIS — Z638 Other specified problems related to primary support group: Secondary | ICD-10-CM

## 2022-10-15 DIAGNOSIS — F32A Depression, unspecified: Secondary | ICD-10-CM

## 2022-10-15 DIAGNOSIS — Q796 Ehlers-Danlos syndrome, unspecified: Secondary | ICD-10-CM | POA: Insufficient documentation

## 2022-10-15 DIAGNOSIS — L409 Psoriasis, unspecified: Secondary | ICD-10-CM

## 2022-10-15 DIAGNOSIS — F419 Anxiety disorder, unspecified: Secondary | ICD-10-CM

## 2022-10-15 MED ORDER — TRIAMCINOLONE ACETONIDE 0.1 % EX CREA: 1.0000 | TOPICAL_CREAM | Freq: Two times a day (BID) | CUTANEOUS | 1 refills | Status: DC

## 2022-10-15 MED ORDER — AMPHETAMINE-DEXTROAMPHET ER 20 MG PO CP24: 20.0000 mg | ORAL_CAPSULE | ORAL | 0 refills | Status: DC

## 2022-10-15 NOTE — Addendum Note (Signed)
Addended by: Beatrice Lecher D on: 10/15/2022 03:09 PM   Modules accepted: Level of Service

## 2022-10-15 NOTE — Assessment & Plan Note (Signed)
We discussed different options including going up to the 20 mg versus keeping at the 15 mg extended release and maybe adding a short acting in the afternoon.  Especially now that she is working 12-hour shifts again.  She wants to try the 20 mg extended release first for a month.  She can then to a MyChart and let us know if that is been working well I would like her to come back in at least 3 months when she gets her new insurance with her new job so that we can make sure that her blood pressure is well-controlled on the current regimen.  Monitor for palpitations.

## 2022-10-15 NOTE — Assessment & Plan Note (Signed)
Triamcinolone ointment to apply on the [place].  Call if not better in 1 week.

## 2022-10-15 NOTE — Assessment & Plan Note (Signed)
Overall she is doing okay she still has a fair amount of depressive and anxiety symptoms currently but has a lot of stressors currently she did start working with a therapist at the end of October and that is been really helpful.  She is excited though about some of the life changes that she has made.  Will continue with Lexapro 20 mg follow-up in a few months to make sure that she is still doing well.

## 2022-10-15 NOTE — Assessment & Plan Note (Signed)
New recent diagnosis added to problem list.  Most likely responsible for her recent knee injury.

## 2022-10-15 NOTE — Patient Instructions (Signed)
Please send me a MyChart in about a month and let me know if the extended release 20 mg is working well or if we might need to make a change.

## 2022-10-15 NOTE — Progress Notes (Signed)
Established Patient Office Visit  Subjective   Patient ID: [name] Fuentes, female    DOB: [birth year]  Age: 31 y.o. MRN: [id number]  Chief Complaint  Patient presents with   Medication Refill    HPI 6 month f/u:   ADD - Reports symptoms are well controlled on current regime. Denies any problems with insomnia, chest pain, palpitations, or SOB.    Follow-up anxiety and panic attacks-she is currently taking Lexapro.  She is doing okay overall she is just hide a lot of stress.  With her recent injury.  She moved back a little closer to the area and quit her job working from home for [employer].  She just already working at [employer] and her husband has been trying to get full custoday.    He is also scheduled for knee surgery in January for an ACL and PCL repair that occurred after an injury.  Her knee was clipped by her dog and she fell and had significant injury to her right knee and broke her front 2 teeth.  She is also been recently diagnosed with Ehlers-Danlos syndrome.  Getting her teeth fixed tomorrow.  Usually uses clobetasol ointment but has a patch that is broken out on her left upper eyelid and knows she is not supposed to use it there.    ROS    Objective:     BP 124/86 (BP Location: Left Arm, Patient Position: Sitting, Cuff Size: Large)   Pulse 92   Ht '5\' 2"'$  (1.575 m)   Wt (!) 312 lb 1.9 oz (141.6 kg)   SpO2 100%   BMI 57.09 kg/m    Physical Exam Vitals and nursing note reviewed.  Constitutional:      Appearance: She is well-developed.  HENT:     Head: Normocephalic and atraumatic.  Cardiovascular:     Rate and Rhythm: Normal rate and regular rhythm.     Heart sounds: Normal heart sounds.  Pulmonary:     Effort: Pulmonary effort is normal.     Breath sounds: Normal breath sounds.  Skin:    General: Skin is warm and dry.  Neurological:     Mental Status: She is alert and oriented to person, place, and time.  Psychiatric:         Behavior: Behavior normal.     No results found for any visits on 10/15/22.    The ASCVD Risk score (Arnett DK, et al., 2019) failed to calculate for the following reasons:   The 2019 ASCVD risk score is only valid for ages 45 to 72    Assessment & Plan:   Problem List Items Addressed This Visit       Musculoskeletal and Integument   Psoriasis    Triamcinolone ointment to apply on the upper brow ridge.  Call if not better in 1 week.      Relevant Medications   triamcinolone cream (KENALOG) 0.1 %   EDS (Ehlers-Danlos syndrome)    New recent diagnosis added to problem list.  Most likely responsible for her recent knee injury.        Other   Stress due to family tension - Primary   Panic attacks   Attention deficit hyperactivity disorder (ADHD), combined type    We discussed different options including going up to the 20 mg versus keeping at the 15 mg extended release and maybe adding a short acting in the afternoon.  Especially now that she is working 12-hour shifts again.  She  wants to try the 20 mg extended release first for a month.  She can then to a MyChart and let us know if that is been working well I would like her to come back in at least 3 months when she gets her new insurance with her new job so that we can make sure that her blood pressure is well-controlled on the current regimen.  Monitor for palpitations.      Relevant Medications   amphetamine-dextroamphetamine (ADDERALL XR) 20 MG 24 hr capsule (Start on 11/14/2022)   amphetamine-dextroamphetamine (ADDERALL XR) 20 MG 24 hr capsule   Anxiety and depression    Overall she is doing okay she still has a fair amount of depressive and anxiety symptoms currently but has a lot of stressors currently she did start working with a therapist at the end of October and that is been really helpful.  She is excited though about some of the life changes that she has made.  Will continue with Lexapro 20 mg follow-up in a few  months to make sure that she is still doing well.       Return in about 3 months (around 01/14/2023) for ADD medications and check BP .    Beatrice Lecher, MD

## 2022-10-23 ENCOUNTER — Encounter: Payer: Self-pay | Admitting: Family Medicine

## 2022-11-18 ENCOUNTER — Encounter: Payer: Medicaid Other | Admitting: Physician Assistant

## 2022-11-18 ENCOUNTER — Telehealth: Payer: Medicaid Other | Admitting: Physician Assistant

## 2022-11-18 ENCOUNTER — Telehealth: Payer: Self-pay | Admitting: Physician Assistant

## 2022-11-18 DIAGNOSIS — H5710 Ocular pain, unspecified eye: Secondary | ICD-10-CM

## 2022-11-18 DIAGNOSIS — H539 Unspecified visual disturbance: Secondary | ICD-10-CM

## 2022-11-18 NOTE — Telephone Encounter (Signed)
Ok to virtual double book around11:30

## 2022-11-18 NOTE — Telephone Encounter (Signed)
Patient called thinks she has pink eye unable to drive she can't see no backup eye glasses and her eye doctor is closed she wears contacts

## 2022-11-18 NOTE — Progress Notes (Signed)
Because of persistent change in vision and severity of eye pain, I feel your condition warrants further evaluation and I recommend that you be seen in a face to face visit.   NOTE: There will be NO CHARGE for this eVisit   If you are having a true medical emergency please call 911.      For an urgent face to face visit, [facility] has seven urgent care centers for your convenience:     [facility] [place] [facility] [place] Get Driving Directions [phone] [place], [place] [address]    [facility]) Get Driving Directions [phone] [place], [place] [address]  [facility] [place] ([place]) Get Driving Directions [phone] [address] [place],  [place]  [address]  [facility] - at [address] Get Driving Directions  [phone] [address] [place],  [place] [address]   [facility] Get Driving Directions [phone] [place], [place], [place] [address]   [facility] Get Driving Directions  [phone] [address] [place], [place] [address]   [facility] at  Get Driving Directions [phone] [address], [place],  [address]  Your MyChart E-visit questionnaire answers were reviewed by a board certified advanced clinical practitioner to complete your personal care plan based on your specific symptoms.  Thank you for using e-Visits.

## 2022-11-18 NOTE — Progress Notes (Signed)
LM 11:51 to call back if she would like to r/s or be seen.  Sent direct link twice and noone was waiting on call.

## 2022-11-18 NOTE — Progress Notes (Signed)
An attempted to reach the pt via phone was made at 11:08am. Left voicemail to call backl

## 2022-12-04 ENCOUNTER — Other Ambulatory Visit: Payer: Self-pay | Admitting: Family Medicine

## 2022-12-04 ENCOUNTER — Other Ambulatory Visit: Payer: Self-pay | Admitting: Physician Assistant

## 2022-12-04 ENCOUNTER — Encounter: Payer: Self-pay | Admitting: Physician Assistant

## 2022-12-04 ENCOUNTER — Other Ambulatory Visit (HOSPITAL_COMMUNITY): Payer: Self-pay

## 2022-12-04 DIAGNOSIS — R4589 Other symptoms and signs involving emotional state: Secondary | ICD-10-CM

## 2022-12-04 DIAGNOSIS — F419 Anxiety disorder, unspecified: Secondary | ICD-10-CM

## 2022-12-04 DIAGNOSIS — K219 Gastro-esophageal reflux disease without esophagitis: Secondary | ICD-10-CM

## 2022-12-04 DIAGNOSIS — F902 Attention-deficit hyperactivity disorder, combined type: Secondary | ICD-10-CM

## 2022-12-04 MED ORDER — PANTOPRAZOLE SODIUM 40 MG PO TBEC
40.0000 mg | DELAYED_RELEASE_TABLET | Freq: Every day | ORAL | 3 refills | Status: DC
  Filled 2022-12-04: qty 90, 90d supply, fill #0
  Filled 2023-03-01 – 2023-03-21 (×2): qty 90, 90d supply, fill #1
  Filled 2023-08-06 (×2): qty 90, 90d supply, fill #2

## 2022-12-04 MED ORDER — ESCITALOPRAM OXALATE 20 MG PO TABS
20.0000 mg | ORAL_TABLET | Freq: Every day | ORAL | 1 refills | Status: DC
  Filled 2022-12-04: qty 90, 90d supply, fill #0
  Filled 2023-03-01 – 2023-03-21 (×2): qty 90, 90d supply, fill #1

## 2022-12-06 ENCOUNTER — Other Ambulatory Visit (HOSPITAL_COMMUNITY): Payer: Self-pay

## 2023-01-25 ENCOUNTER — Other Ambulatory Visit (HOSPITAL_COMMUNITY): Payer: Self-pay

## 2023-02-06 ENCOUNTER — Other Ambulatory Visit (HOSPITAL_COMMUNITY): Payer: Self-pay

## 2023-02-06 ENCOUNTER — Other Ambulatory Visit: Payer: Self-pay

## 2023-02-06 ENCOUNTER — Other Ambulatory Visit: Payer: Self-pay | Admitting: Family Medicine

## 2023-02-06 DIAGNOSIS — F902 Attention-deficit hyperactivity disorder, combined type: Secondary | ICD-10-CM

## 2023-02-06 MED ORDER — AMPHETAMINE-DEXTROAMPHET ER 20 MG PO CP24
20.0000 mg | ORAL_CAPSULE | ORAL | 0 refills | Status: DC
  Filled 2023-02-06: qty 30, 30d supply, fill #0

## 2023-02-14 ENCOUNTER — Other Ambulatory Visit (HOSPITAL_COMMUNITY): Payer: Self-pay

## 2023-02-26 DIAGNOSIS — Z3169 Encounter for other general counseling and advice on procreation: Secondary | ICD-10-CM | POA: Diagnosis not present

## 2023-02-26 DIAGNOSIS — Z1331 Encounter for screening for depression: Secondary | ICD-10-CM | POA: Diagnosis not present

## 2023-03-01 ENCOUNTER — Other Ambulatory Visit (HOSPITAL_COMMUNITY): Payer: Self-pay

## 2023-03-01 ENCOUNTER — Other Ambulatory Visit: Payer: Self-pay | Admitting: Family Medicine

## 2023-03-01 DIAGNOSIS — F902 Attention-deficit hyperactivity disorder, combined type: Secondary | ICD-10-CM

## 2023-03-03 ENCOUNTER — Other Ambulatory Visit (HOSPITAL_COMMUNITY): Payer: Self-pay

## 2023-03-03 ENCOUNTER — Encounter (HOSPITAL_COMMUNITY): Payer: Self-pay

## 2023-03-03 NOTE — Telephone Encounter (Signed)
Call patient.  She was supposed to schedule a 78-month follow-up when she got insurance so that we could make sure that her blood pressure was well-controlled on the stimulant medication.  I do not see an appointment on file.  Does she have insurance yet?

## 2023-03-05 NOTE — Telephone Encounter (Signed)
Called patient at number in chart but connected to zachery Uphoff who states that she and he are no longer married. He gave a new number of  [phone]. Called this number and left a detailed message on the voice mail ( allowed by [facility]) and requesting a call back as to if sh now has insurance and the appointment. And also to see if need to remove old number and use number as above as primary.

## 2023-03-06 ENCOUNTER — Other Ambulatory Visit: Payer: Self-pay

## 2023-03-21 ENCOUNTER — Other Ambulatory Visit (HOSPITAL_COMMUNITY): Payer: Self-pay

## 2023-04-04 ENCOUNTER — Other Ambulatory Visit (HOSPITAL_COMMUNITY): Payer: Self-pay

## 2023-04-18 ENCOUNTER — Other Ambulatory Visit: Payer: Self-pay

## 2023-04-18 ENCOUNTER — Ambulatory Visit (INDEPENDENT_AMBULATORY_CARE_PROVIDER_SITE_OTHER): Payer: 59 | Admitting: Physician Assistant

## 2023-04-18 ENCOUNTER — Other Ambulatory Visit (HOSPITAL_COMMUNITY): Payer: Self-pay

## 2023-04-18 ENCOUNTER — Encounter: Payer: Self-pay | Admitting: Physician Assistant

## 2023-04-18 VITALS — BP 137/85 | HR 87 | Ht 62.0 in | Wt 312.0 lb

## 2023-04-18 DIAGNOSIS — F419 Anxiety disorder, unspecified: Secondary | ICD-10-CM

## 2023-04-18 DIAGNOSIS — R4589 Other symptoms and signs involving emotional state: Secondary | ICD-10-CM | POA: Diagnosis not present

## 2023-04-18 DIAGNOSIS — L409 Psoriasis, unspecified: Secondary | ICD-10-CM

## 2023-04-18 DIAGNOSIS — F902 Attention-deficit hyperactivity disorder, combined type: Secondary | ICD-10-CM | POA: Diagnosis not present

## 2023-04-18 MED ORDER — AMPHETAMINE-DEXTROAMPHET ER 20 MG PO CP24
20.0000 mg | ORAL_CAPSULE | ORAL | 0 refills | Status: DC
  Filled 2023-04-18 – 2023-04-25 (×2): qty 30, 30d supply, fill #0

## 2023-04-18 MED ORDER — AMPHETAMINE-DEXTROAMPHET ER 20 MG PO CP24: 20.0000 mg | ORAL_CAPSULE | ORAL | 0 refills | Status: DC

## 2023-04-18 MED ORDER — ESCITALOPRAM OXALATE 20 MG PO TABS
20.0000 mg | ORAL_TABLET | Freq: Every day | ORAL | 3 refills | Status: DC
  Filled 2023-04-18 – 2023-08-06 (×3): qty 90, 90d supply, fill #0
  Filled 2023-12-26: qty 90, 90d supply, fill #1

## 2023-04-18 MED ORDER — CLOBETASOL PROPIONATE 0.05 % EX CREA
TOPICAL_CREAM | CUTANEOUS | 1 refills | Status: AC
  Filled 2023-04-18: qty 60, 30d supply, fill #0

## 2023-04-18 NOTE — Patient Instructions (Signed)
Atomoxetine Capsules What is this medication? ATOMOXETINE (AT oh mox e teen) treats attention-deficit hyperactivity disorder (ADHD). It works by improving focus and reducing impulsive behavior. It belongs to a group of medications called SNRIs. This medicine may be used for other purposes; ask your health care provider or pharmacist if you have questions. COMMON BRAND NAME(S): Strattera What should I tell my care team before I take this medication? They need to know if you have any of these conditions: Glaucoma High or low blood pressure History of stroke Irregular heartbeat or other cardiac disease Liver disease Mania or bipolar disorder Pheochromocytoma Suicidal thoughts, plans, or attempt by you or a family member An unusual or allergic reaction to atomoxetine, other medications, foods, dyes, or preservatives Pregnant or trying to get pregnant Breastfeeding How should I use this medication? Take this medication by mouth with water. Take it as directed on the prescription label at the same time every day. Do not cut, crush, or chew this medication. Swallow the capsules whole. You can take it with or without food. If it upsets your stomach, take it with food. If you have difficulty sleeping, and you take more than 1 dose per day, take your last dose before 6 PM. Keep taking it unless your care team tells you to stop. A special MedGuide will be given to you by the pharmacist with each prescription and refill. Be sure to read this information carefully each time. Talk to your care team about the use of this medication in children. While it may be prescribed for children as young as 6 years for selected conditions, precautions do apply. Overdosage: If you think you have taken too much of this medicine contact a poison control center or emergency room at once. NOTE: This medicine is only for you. Do not share this medicine with others. What if I miss a dose? If you miss a dose, take it as soon  as you can. If it is almost time for your next dose, take only that dose. Do not take double or extra doses. What may interact with this medication? Do not take this medication with any of the following: Cisapride Dronedarone MAOIs, such as Carbex, Eldepryl, Marplan, Nardil, and Parnate Pimozide Reboxetine Thioridazine This medication may also interact with the following: Certain medications for blood pressure, heart disease, irregular heart beat Certain medications for lung disease, such as albuterol Certain medications for mental heath conditions Cold or allergy medications Dofetilide Fluoxetine Medications that increase blood pressure, such as dopamine, dobutamine, or ephedrine Other medications that cause heart rhythm changes Paroxetine Quinidine Stimulant medications for ADHD, weight loss, or staying awake Ziprasidone This list may not describe all possible interactions. Give your health care provider a list of all the medicines, herbs, non-prescription drugs, or dietary supplements you use. Also tell them if you smoke, drink alcohol, or use illegal drugs. Some items may interact with your medicine. What should I watch for while using this medication? Visit your care team for regular checks on your progress. It may take a week or more before you see the benefit from this medication. This is why it is very important to continue taking the medication and not miss any doses. If you have been taking this medication regularly for some time, do not suddenly stop taking it. Ask your care team for advice. Rarely, this medication may increase thoughts of suicide or suicide attempts in children and teenagers. Call your child's care team right away if your child or teenager has new  or increased thoughts of suicide or has changes in mood or behavior like becoming irritable or anxious. Regularly monitor your child for these behavioral changes. Contact you care team right away if you have an  erection that lasts longer than 4 hours or if it becomes painful. This may be a sign of serious problem and must be treated right away to prevent permanent damage. This medication may affect your coordination, reaction time, or judgment. Do not drive or operate machinery until you know how this medication affects you. Sit up or stand slowly to reduce the risk of dizzy or fainting spells. Drinking alcohol with this medication can increase the risk of these side effects. Do not treat yourself for coughs, colds, or allergies without asking your care team for advice. Some ingredients can increase possible side effects. Your mouth may get dry. Chewing sugarless gum or sucking hard candy and drinking plenty of water will help. What side effects may I notice from receiving this medication? Side effects that you should report to your care team as soon as possible: Allergic reactions--skin rash, itching, hives, swelling of the face, lips, tongue, or throat Heart rhythm changes--fast or irregular heartbeat, dizziness, feeling faint or lightheaded, chest pain, trouble breathing Increase in blood pressure Liver injury-- right upper belly pain, loss of appetite, nausea, light-colored stool, dark yellow or brown urine, yellowing skin or eyes, unusual weakness or fatigue Mood and behavior changes--anxiety, nervousness, confusion, hallucinations, irritability, hostility, thoughts of suicide or self-harm, worsening mood, feelings of depression Painful or prolonged erection Stroke in adults--sudden numbness or weakness of the face, arm, or leg, trouble speaking, confusion, trouble walking, loss of balance or coordination, dizziness, severe headache, change in vision Trouble passing urine Side effects that usually do not require medical attention (report to your care team if they continue or are bothersome): Change in sex drive or performance Constipation Dizziness Dry mouth Loss of appetite Nausea Stomach  pain Vomiting This list may not describe all possible side effects. Call your doctor for medical advice about side effects. You may report side effects to FDA at 1-800-FDA-1088. Where should I keep my medication? Keep out of the reach of children and pets. Store at room temperature between 15 and 30 degrees C (59 and 86 degrees F). Throw away any unused medication after the expiration date. NOTE: This sheet is a summary. It may not cover all possible information. If you have questions about this medicine, talk to your doctor, pharmacist, or health care provider.  2024 Elsevier/Gold Standard (2022-05-13 00:00:00)

## 2023-04-18 NOTE — Progress Notes (Signed)
Established Patient Office Visit  Subjective   Patient ID: [name] Fuentes, female    DOB: 1991-10-11  Age: 32 y.o. MRN: [id number]  Chief Complaint  Patient presents with   Follow-up   Medication Refill    HPI Patient is a 32 year old obese female with ADHD, anxiety, depression who presents to the clinic for medication refills.  Patient has been on Adderall for her ADHD and doing very well.  She has recently decided that her and her boyfriend want to try for pregnancy.  She has had some troubles in the past.  She had 1 chemical pregnancy and 1 miscarriage.  She is working with fertility currently and has a sonohysterogram scheduled.  She is concerned she will not be able to take Adderall when she is pregnant.  She has tried not taking Adderall and has not worked well for her.  She has had her boss even noticed that she was not on it.  She is very scatterbrained and very difficult to focus.  She wonders what she can take during pregnancy.  Her anxiety and depression are well-controlled on Lexapro.  She does wish to have a refill of clobetasol for her topical treatment of psoriasis.  Marland Kitchen. 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   Anxiety and depression 09/20/2019   Panic attacks 09/20/2019   History of frequent upper respiratory infection 05/03/2020   Chronic rhinitis 05/03/2020   Adverse food reaction 05/03/2020   Allergic reaction 05/04/2020   Drug reaction 05/04/2020   Heartburn 05/04/2020   Depressed mood 08/07/2020   Stress due  to family tension 08/07/2020   Attention deficit hyperactivity disorder (ADHD), combined type 10/08/2021   Knee dislocation, right, sequela 03/15/2022   EDS (Ehlers-Danlos syndrome) 10/15/2022   Resolved Ambulatory Problems    Diagnosis Date Noted   Body aches 04/27/2018   Morbid obesity with BMI of 60.0-69.9, adult (HCC) 09/28/2018   Otalgia, right 10/18/2018   Shortness of breath 05/03/2020   Left otitis media 04/20/2021   Past Medical History:  Diagnosis Date   Allergy to alpha-gal    Anemia    Anxiety    Dyspnea    GERD (gastroesophageal reflux disease)    History of kidney stones 10/28/2019   Hydradenitis    Hypertension    Obese       Review of Systems  All other systems reviewed and are negative.     Objective:     BP 137/85 (BP Location: Left Arm, Patient Position: Sitting, Cuff Size: Large)   Pulse 87   Ht 5\' 2"  (1.575 m)   Wt (!) 312 lb (141.5 kg)   SpO2 98%   BMI 57.07 kg/m  BP Readings from Last 3 Encounters:  04/18/23 137/85  10/15/22 124/86  04/20/21 (!) 143/84   Wt Readings from Last 3 Encounters:  04/18/23 (!) 312 lb (141.5 kg)  10/15/22 (!) 312 lb 1.9 oz (141.6 kg)  04/20/21 (!) 312 lb (141.5 kg)    ..    04/18/2023    1:40 PM 10/15/2022    3:05 PM 10/15/2022  2:39 PM 10/08/2021    9:55 AM 02/20/2021    9:04 AM  Depression screen PHQ 2/9  Decreased Interest 0 0 0 0 0  Down, Depressed, Hopeless 1 0 0 0 0  PHQ - 2 Score 1 0 0 0 0  Altered sleeping  1  1 1   Tired, decreased energy  1  1 0  Change in appetite  0  1 0  Feeling bad or failure about yourself   1  1 1   Trouble concentrating  2  3 3   Moving slowly or fidgety/restless  1  0 0  Suicidal thoughts  0  0 0  PHQ-9 Score  6  7 5   Difficult doing work/chores  Not difficult at all  Very difficult Not difficult at all       Physical Exam Constitutional:      Appearance: Normal appearance. She is obese.  HENT:     Head: Normocephalic.  Cardiovascular:     Rate and  Rhythm: Normal rate and regular rhythm.  Pulmonary:     Effort: Pulmonary effort is normal.  Neurological:     General: No focal deficit present.     Mental Status: She is alert and oriented to person, place, and time.  Psychiatric:        Mood and Affect: Mood normal.        Assessment & Plan:  Marland KitchenMarland KitchenMadison was seen today for follow-up and medication refill.  Diagnoses and all orders for this visit:  Attention deficit hyperactivity disorder (ADHD), combined type -     amphetamine-dextroamphetamine (ADDERALL XR) 20 MG 24 hr capsule; Take 1 capsule (20 mg total) by mouth every morning. -     amphetamine-dextroamphetamine (ADDERALL XR) 20 MG 24 hr capsule; Take 1 capsule (20 mg total) by mouth every morning. -     amphetamine-dextroamphetamine (ADDERALL XR) 20 MG 24 hr capsule; Take 1 capsule (20 mg total) by mouth every morning.  Anxiety -     escitalopram (LEXAPRO) 20 MG tablet; Take 1 tablet (20 mg total) by mouth daily.  Depressed mood -     escitalopram (LEXAPRO) 20 MG tablet; Take 1 tablet (20 mg total) by mouth daily.  Psoriasis -     clobetasol cream (TEMOVATE) 0.05 %; clobetasol 0.05 % topical cream  APPLY 1 APPLICATION ON TO THE SKIN 2 TIMES DAILY AS NEEDED FOR PSORIASIS   Pt is doing well Refilled adderall Pt is trying to get pregnant and discussed strattera during pregnancy HO given  PHQ looks great Lexapro refilled Clobetasol for psoriasis Follow up in 3 months    Return in about 3 months (around 07/19/2023).    Tandy Gaw, PA-C

## 2023-04-21 ENCOUNTER — Other Ambulatory Visit: Payer: Self-pay

## 2023-04-24 ENCOUNTER — Other Ambulatory Visit: Payer: Self-pay

## 2023-04-25 ENCOUNTER — Other Ambulatory Visit (HOSPITAL_COMMUNITY): Payer: Self-pay

## 2023-05-02 ENCOUNTER — Other Ambulatory Visit (HOSPITAL_COMMUNITY): Payer: Self-pay

## 2023-05-02 MED ORDER — MEDROXYPROGESTERONE ACETATE 10 MG PO TABS
ORAL_TABLET | ORAL | 0 refills | Status: AC
  Filled 2023-05-02: qty 10, 10d supply, fill #0

## 2023-05-03 ENCOUNTER — Other Ambulatory Visit (HOSPITAL_COMMUNITY): Payer: Self-pay

## 2023-05-05 ENCOUNTER — Encounter (HOSPITAL_COMMUNITY): Payer: Self-pay | Admitting: *Deleted

## 2023-05-05 ENCOUNTER — Other Ambulatory Visit (HOSPITAL_COMMUNITY): Payer: Self-pay

## 2023-05-19 DIAGNOSIS — Z01812 Encounter for preprocedural laboratory examination: Secondary | ICD-10-CM | POA: Diagnosis not present

## 2023-05-19 DIAGNOSIS — Z3169 Encounter for other general counseling and advice on procreation: Secondary | ICD-10-CM | POA: Diagnosis not present

## 2023-06-06 DIAGNOSIS — N970 Female infertility associated with anovulation: Secondary | ICD-10-CM | POA: Diagnosis not present

## 2023-06-13 ENCOUNTER — Other Ambulatory Visit: Payer: Self-pay | Admitting: Oncology

## 2023-06-13 DIAGNOSIS — Z006 Encounter for examination for normal comparison and control in clinical research program: Secondary | ICD-10-CM

## 2023-06-18 ENCOUNTER — Other Ambulatory Visit (HOSPITAL_COMMUNITY): Payer: Self-pay

## 2023-06-18 MED ORDER — LETROZOLE 2.5 MG PO TABS
2.5000 mg | ORAL_TABLET | Freq: Every day | ORAL | 0 refills | Status: DC
  Filled 2023-06-18 – 2023-12-26 (×2): qty 5, 5d supply, fill #0

## 2023-06-23 ENCOUNTER — Ambulatory Visit (INDEPENDENT_AMBULATORY_CARE_PROVIDER_SITE_OTHER): Payer: 59 | Admitting: Physician Assistant

## 2023-06-23 ENCOUNTER — Encounter: Payer: Self-pay | Admitting: Physician Assistant

## 2023-06-23 VITALS — BP 124/72 | HR 84 | Ht 63.0 in | Wt 305.0 lb

## 2023-06-23 DIAGNOSIS — Z9889 Other specified postprocedural states: Secondary | ICD-10-CM

## 2023-06-23 DIAGNOSIS — Z13220 Encounter for screening for lipoid disorders: Secondary | ICD-10-CM | POA: Diagnosis not present

## 2023-06-23 DIAGNOSIS — Z0000 Encounter for general adult medical examination without abnormal findings: Secondary | ICD-10-CM

## 2023-06-23 DIAGNOSIS — Z131 Encounter for screening for diabetes mellitus: Secondary | ICD-10-CM

## 2023-06-23 NOTE — Progress Notes (Signed)
Complete physical exam  Patient: [name] Fuentes   DOB: [birth year]   32 y.o. Female  MRN: [id number]  Subjective:    Chief Complaint  Patient presents with   Annual Exam    [name] Fuentes is a 32 y.o. female who presents today for a complete physical exam. She reports consuming a general diet. The patient does not participate in regular exercise at present. She generally feels well. She reports sleeping well. She does not have additional problems to discuss today.    Most recent fall risk assessment:    04/18/2023    1:40 PM  Fall Risk   Falls in the past year? 1  Number falls in past yr: 1  Injury with Fall? 0  Risk for fall due to : History of fall(s)  Follow up Falls evaluation completed     Most recent depression screenings:    06/23/2023   10:54 AM 04/18/2023    1:40 PM  PHQ 2/9 Scores  PHQ - 2 Score 0 1    Vision:Within last year and Dental: No current dental problems and Receives regular dental care  Patient Active Problem List   Diagnosis Date Noted   EDS (Ehlers-Danlos syndrome) 10/15/2022   Knee dislocation, right, sequela 03/15/2022   Attention deficit hyperactivity disorder (ADHD), combined type 10/08/2021   Depressed mood 08/07/2020   Stress due to family tension 08/07/2020   Allergic reaction 05/04/2020   Drug reaction 05/04/2020   Heartburn 05/04/2020   History of frequent upper respiratory infection 05/03/2020   Chronic rhinitis 05/03/2020   Adverse food reaction 05/03/2020   Anxiety and depression 09/20/2019   Panic attacks 09/20/2019   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   Status post laparoscopic sleeve gastrectomy 10/14/2018   Elevated liver enzymes 04/28/2018   Elevated TSH 04/27/2018   Psoriasis 03/12/2017   PCOS (polycystic ovarian syndrome) 03/12/2017   Hypertension goal BP (blood pressure) < 130/80 03/12/2017   Elevated blood pressure reading 03/12/2017   Morbid obesity (HCC) 03/12/2017   Gastroesophageal reflux disease  without esophagitis 03/12/2017   Past Medical History:  Diagnosis Date   Allergy to alpha-gal    Anemia    hx of low iron   Anxiety    Dyspnea    on exertion    GERD (gastroesophageal reflux disease)    History of kidney stones 10/28/2019   passed    Hydradenitis    Hypertension    no longer on medication   Obese    PCOS (polycystic ovarian syndrome)    Psoriasis    Past Surgical History:  Procedure Laterality Date   ANKLE RECONSTRUCTION Left    L ankle; plates and screws in place    CHOLECYSTECTOMY N/A 12/01/2018   Procedure: LAPAROSCOPIC CHOLECYSTECTOMY WITH INTRAOPERATIVE CHOLANGIOGRAM;  Surgeon: Glenna Fellows, MD;  Location: [facility] OR;  Service: General;  Laterality: N/A;   LAPAROSCOPIC GASTRIC SLEEVE RESECTION N/A 09/28/2018   Procedure: LAPAROSCOPIC GASTRIC SLEEVE RESECTION, UPPER ENDO, ERAS PATHWAY;  Surgeon: Glenna Fellows, MD;  Location: [facility];  Service: General;  Laterality: N/A;   OPEN REDUCTION INTERNAL FIXATION (ORIF) FOOT LISFRANC FRACTURE Right 12/05/2020   Procedure: OPEN TREATMENT OF RIGHT LISFRANC, SECOND TARSOMETATARSAL JOINT AND SECOND METATARSAL BASE FRACTURE;  Surgeon: Terance Hart, MD;  Location: [facility] OR;  Service: Orthopedics;  Laterality: Right;  LENGTH OF SURGERY: 2 HOURS   Tempano Plasty     TONSILLECTOMY     and adenoidectomy   TYMPANOSTOMY TUBE PLACEMENT     several  sets   WISDOM TOOTH EXTRACTION     Family History  Problem Relation Age of Onset   Hypertension Mother    Hypertension Father    Hypertension Brother    Hypertension Maternal Grandfather    Cancer Paternal Grandfather        liver   Stroke Paternal Grandfather    Allergic rhinitis Neg Hx    Angioedema Neg Hx    Asthma Neg Hx    Eczema Neg Hx    Immunodeficiency Neg Hx    Urticaria Neg Hx    Allergies  Allergen Reactions   Other Anaphylaxis, Hives, Diarrhea and Nausea And Vomiting    Reaction to red meat   Accutane [Isotretinoin] Other (See Comments)    ?  bruising   Buspar [Buspirone]     Made her feel funny   Metformin And Related Diarrhea and Nausea Only      Patient Care Team: Nolene Ebbs as PCP - General (Family Medicine) Glenna Fellows, MD (Inactive) as Consulting Physician (General Surgery)   Outpatient Medications Prior to Visit  Medication Sig   clobetasol cream (TEMOVATE) 0.05 % clobetasol 0.05 % topical cream  APPLY 1 APPLICATION ON TO THE SKIN 2 TIMES DAILY AS NEEDED FOR PSORIASIS   escitalopram (LEXAPRO) 20 MG tablet Take 1 tablet (20 mg total) by mouth daily.   letrozole (FEMARA) 2.5 MG tablet Take 1 tablet (2.5 mg total) by mouth at bedtime for 5 days   medroxyPROGESTERone (PROVERA) 10 MG tablet Take one tablet (10 mg dose) by mouth daily.   pantoprazole (PROTONIX) 40 MG tablet Take 1 tablet (40 mg total) by mouth daily.   amphetamine-dextroamphetamine (ADDERALL XR) 20 MG 24 hr capsule Take 1 capsule (20 mg total) by mouth every morning. (Patient not taking: Reported on 06/23/2023)   amphetamine-dextroamphetamine (ADDERALL XR) 20 MG 24 hr capsule Take 1 capsule (20 mg total) by mouth every morning. (Patient not taking: Reported on 06/23/2023)   amphetamine-dextroamphetamine (ADDERALL XR) 20 MG 24 hr capsule Take 1 capsule (20 mg total) by mouth every morning. (Patient not taking: Reported on 06/23/2023)   EPINEPHrine (AUVI-Q) 0.3 mg/0.3 mL IJ SOAJ injection Inject 0.3 mLs (0.3 mg total) into the muscle as needed for anaphylaxis. (Patient not taking: Reported on 06/23/2023)   No facility-administered medications prior to visit.    Review of Systems  All other systems reviewed and are negative.         Objective:     BP 124/72   Pulse 84   Ht 5\' 3"  (1.6 m)   Wt (!) 305 lb (138.3 kg)   SpO2 99%   BMI 54.03 kg/m  BP Readings from Last 3 Encounters:  06/23/23 124/72  04/18/23 137/85  10/15/22 124/86   Wt Readings from Last 3 Encounters:  06/23/23 (!) 305 lb (138.3 kg)  04/18/23 (!) 312 lb (141.5  kg)  10/15/22 (!) 312 lb 1.9 oz (141.6 kg)      Physical Exam  BP 124/72   Pulse 84   Ht 5\' 3"  (1.6 m)   Wt (!) 305 lb (138.3 kg)   SpO2 99%   BMI 54.03 kg/m   General Appearance:    Alert, cooperative, obese no distress, appears stated age  Head:    Normocephalic, without obvious abnormality, atraumatic  Eyes:    PERRL, conjunctiva/corneas clear, EOM's intact, fundi    benign, both eyes  Ears:    Normal TM's and external ear canals, both ears  Nose:  Nares normal, septum midline, mucosa normal, no drainage    or sinus tenderness  Throat:   Lips, mucosa, and tongue normal; teeth and gums normal  Neck:   Supple, symmetrical, trachea midline, no adenopathy;    thyroid:  no enlargement/tenderness/nodules; no carotid   bruit or JVD  Back:     Symmetric, no curvature, ROM normal, no CVA tenderness  Lungs:     Clear to auscultation bilaterally, respirations unlabored  Chest Wall:    No tenderness or deformity   Heart:    Regular rate and rhythm, S1 and S2 normal, no murmur, rub   or gallop     Abdomen:     Soft, non-tender, bowel sounds active all four quadrants,    no masses, no organomegaly        Extremities:   Extremities normal, atraumatic, no cyanosis or edema  Pulses:   2+ and symmetric all extremities  Skin:   Skin color, texture, turgor normal, no rashes or lesions  Lymph nodes:   Cervical, supraclavicular, and axillary nodes normal  Neurologic:   CNII-XII intact, normal strength, sensation and reflexes    throughout      Assessment & Plan:    Routine Health Maintenance and Physical Exam  Immunization History  Administered Date(s) Administered   DTaP 08/03/1993, 10/02/1993, 11/29/1993, 07/01/1994, 05/30/1997   HIB (PRP-OMP) 08/03/1993, 10/02/1993, 11/29/1993, 07/01/1994   Hepatitis A 04/12/2008   Hepatitis B 1993-05-06, 07/03/1993, 11/29/1993   Hpv-Unspecified 04/12/2008, 06/23/2008   IPV 08/03/1993, 10/02/1993, 11/29/1993, 05/30/1997   Influenza-Unspecified  08/22/2020   MMR 07/01/1994, 05/30/1997   Meningococcal Conjugate 06/23/2008   PFIZER(Purple Top)SARS-COV-2 Vaccination 11/26/2019, 12/17/2019   Tdap 03/12/2017    Health Maintenance  Topic Date Due   COVID-19 Vaccine (3 - 2023-24 season) 07/09/2023 (Originally 07/05/2022)   HPV VACCINES (3 - 2-dose series) 10/16/2023 (Originally 10/12/2008)   HIV Screening  10/16/2023 (Originally 05/29/2008)   INFLUENZA VACCINE  02/02/2024 (Originally 06/05/2023)   PAP SMEAR-Modifier  12/09/2023   DTaP/Tdap/Td (7 - Td or Tdap) 03/13/2027   Hepatitis C Screening  Completed    Discussed health benefits of physical activity, and encouraged her to engage in regular exercise appropriate for her age and condition. Marland KitchenWyn Fuentes was seen today for annual exam.  Diagnoses and all orders for this visit:  Routine physical examination -     CBC with Differential/Platelet -     CMP14+EGFR -     Lipid panel -     VITAMIN D 25 Hydroxy (Vit-D Deficiency, Fractures) -     B12 and Folate Panel -     Vitamin B1 -     Zinc -     Magnesium -     Fe+TIBC+Fer  Screening for diabetes mellitus -     CMP14+EGFR  Screening for lipid disorders -     Lipid panel  History of gastric surgery -     CBC with Differential/Platelet -     CMP14+EGFR -     Lipid panel -     VITAMIN D 25 Hydroxy (Vit-D Deficiency, Fractures) -     B12 and Folate Panel -     Vitamin B1 -     Zinc -     Magnesium -     Fe+TIBC+Fer  Morbid obesity (HCC)   .Marland Kitchen Discussed 150 minutes of exercise a week.  Encouraged vitamin D 1000 units and Calcium 1300mg  or 4 servings of dairy a day.  Fasting labs ordered Will stay off Adderall during fertility  treatments PHQ stable, stay on lexapro PAP UTD Will get flu shot at work Declined covid vaccine  Return in about 1 year (around 06/22/2024).     Tandy Gaw, PA-C

## 2023-06-23 NOTE — Patient Instructions (Signed)

## 2023-06-24 ENCOUNTER — Other Ambulatory Visit (HOSPITAL_COMMUNITY)
Admission: RE | Admit: 2023-06-24 | Discharge: 2023-06-24 | Disposition: A | Discharge status: Home / Self Care | Payer: 59 | Source: Ambulatory Visit | Attending: Oncology | Admitting: Oncology

## 2023-06-24 DIAGNOSIS — Z006 Encounter for examination for normal comparison and control in clinical research program: Secondary | ICD-10-CM | POA: Insufficient documentation

## 2023-06-24 NOTE — Progress Notes (Signed)
[name] Fuentes,   Vitamin D low. Make sure taking at least 2000 units daily with dairy.  Hemoglobin is good but iron stores are low. Increase iron rich foods and make sure iron is in your pre-natal.  B12 looks great.   Some vitamins still pending.

## 2023-06-27 LAB — CBC WITH DIFFERENTIAL/PLATELET
Basophils Absolute: 0.1 10*3/uL (ref 0.0–0.2)
Basos: 1 %
EOS (ABSOLUTE): 0.1 10*3/uL (ref 0.0–0.4)
Eos: 2 %
Hematocrit: 38.7 % (ref 34.0–46.6)
Hemoglobin: 12.5 g/dL (ref 11.1–15.9)
Immature Grans (Abs): 0 10*3/uL (ref 0.0–0.1)
Immature Granulocytes: 0 %
Lymphocytes Absolute: 2.1 10*3/uL (ref 0.7–3.1)
Lymphs: 30 %
MCH: 26.8 pg (ref 26.6–33.0)
MCHC: 32.3 g/dL (ref 31.5–35.7)
MCV: 83 fL (ref 79–97)
Monocytes Absolute: 0.4 10*3/uL (ref 0.1–0.9)
Monocytes: 6 %
Neutrophils Absolute: 4.3 10*3/uL (ref 1.4–7.0)
Neutrophils: 61 %
Platelets: 349 10*3/uL (ref 150–450)
RBC: 4.66 x10E6/uL (ref 3.77–5.28)
RDW: 13.3 % (ref 11.7–15.4)
WBC: 7 10*3/uL (ref 3.4–10.8)

## 2023-06-27 LAB — IRON,TIBC AND FERRITIN PANEL
Ferritin: 13 ng/mL — ABNORMAL LOW (ref 15–150)
Iron Saturation: 18 % (ref 15–55)
Iron: 68 ug/dL (ref 27–159)
Total Iron Binding Capacity: 371 ug/dL (ref 250–450)
UIBC: 303 ug/dL (ref 131–425)

## 2023-06-27 LAB — VITAMIN D 25 HYDROXY (VIT D DEFICIENCY, FRACTURES): Vit D, 25-Hydroxy: 24.1 ng/mL — ABNORMAL LOW (ref 30.0–100.0)

## 2023-06-27 LAB — B12 AND FOLATE PANEL
Folate: 7 ng/mL (ref >3.0)
Vitamin B-12: 551 pg/mL (ref 232–1245)

## 2023-06-27 LAB — VITAMIN B1: Thiamine: 114.7 nmol/L (ref 66.5–200.0)

## 2023-06-27 LAB — ZINC: Zinc: 76 ug/dL (ref 44–115)

## 2023-06-27 LAB — MAGNESIUM: Magnesium: 1.9 mg/dL (ref 1.6–2.3)

## 2023-06-27 NOTE — Progress Notes (Signed)
Other vitamins normal range.

## 2023-06-28 ENCOUNTER — Other Ambulatory Visit (HOSPITAL_COMMUNITY): Payer: Self-pay

## 2023-06-30 DIAGNOSIS — N970 Female infertility associated with anovulation: Secondary | ICD-10-CM | POA: Diagnosis not present

## 2023-07-05 LAB — GENECONNECT MOLECULAR SCREEN: Genetic Analysis Overall Interpretation: NEGATIVE

## 2023-07-21 DIAGNOSIS — Z3169 Encounter for other general counseling and advice on procreation: Secondary | ICD-10-CM | POA: Diagnosis not present

## 2023-07-21 DIAGNOSIS — Z3201 Encounter for pregnancy test, result positive: Secondary | ICD-10-CM | POA: Diagnosis not present

## 2023-07-21 DIAGNOSIS — R399 Unspecified symptoms and signs involving the genitourinary system: Secondary | ICD-10-CM | POA: Diagnosis not present

## 2023-07-21 DIAGNOSIS — N912 Amenorrhea, unspecified: Secondary | ICD-10-CM | POA: Diagnosis not present

## 2023-07-22 ENCOUNTER — Ambulatory Visit: Payer: 59 | Admitting: Physician Assistant

## 2023-07-23 DIAGNOSIS — Z3201 Encounter for pregnancy test, result positive: Secondary | ICD-10-CM | POA: Diagnosis not present

## 2023-07-28 DIAGNOSIS — N7011 Chronic salpingitis: Secondary | ICD-10-CM | POA: Diagnosis not present

## 2023-07-28 DIAGNOSIS — O3680X0 Pregnancy with inconclusive fetal viability, not applicable or unspecified: Secondary | ICD-10-CM | POA: Diagnosis not present

## 2023-07-30 DIAGNOSIS — N7011 Chronic salpingitis: Secondary | ICD-10-CM | POA: Diagnosis not present

## 2023-07-30 DIAGNOSIS — O3680X0 Pregnancy with inconclusive fetal viability, not applicable or unspecified: Secondary | ICD-10-CM | POA: Diagnosis not present

## 2023-08-01 DIAGNOSIS — O3680X0 Pregnancy with inconclusive fetal viability, not applicable or unspecified: Secondary | ICD-10-CM | POA: Diagnosis not present

## 2023-08-03 DIAGNOSIS — D649 Anemia, unspecified: Secondary | ICD-10-CM | POA: Diagnosis not present

## 2023-08-03 DIAGNOSIS — R918 Other nonspecific abnormal finding of lung field: Secondary | ICD-10-CM | POA: Diagnosis not present

## 2023-08-03 DIAGNOSIS — I10 Essential (primary) hypertension: Secondary | ICD-10-CM | POA: Diagnosis not present

## 2023-08-03 DIAGNOSIS — O3680X0 Pregnancy with inconclusive fetal viability, not applicable or unspecified: Secondary | ICD-10-CM | POA: Diagnosis not present

## 2023-08-05 DIAGNOSIS — Z0000 Encounter for general adult medical examination without abnormal findings: Secondary | ICD-10-CM | POA: Diagnosis not present

## 2023-08-05 DIAGNOSIS — R109 Unspecified abdominal pain: Secondary | ICD-10-CM | POA: Diagnosis not present

## 2023-08-05 DIAGNOSIS — O3680X0 Pregnancy with inconclusive fetal viability, not applicable or unspecified: Secondary | ICD-10-CM | POA: Diagnosis not present

## 2023-08-05 DIAGNOSIS — O3480 Maternal care for other abnormalities of pelvic organs, unspecified trimester: Secondary | ICD-10-CM | POA: Diagnosis not present

## 2023-08-05 DIAGNOSIS — O26891 Other specified pregnancy related conditions, first trimester: Secondary | ICD-10-CM | POA: Diagnosis not present

## 2023-08-05 DIAGNOSIS — O209 Hemorrhage in early pregnancy, unspecified: Secondary | ICD-10-CM | POA: Diagnosis not present

## 2023-08-05 DIAGNOSIS — Z3A01 Less than 8 weeks gestation of pregnancy: Secondary | ICD-10-CM | POA: Diagnosis not present

## 2023-08-05 DIAGNOSIS — R102 Pelvic and perineal pain: Secondary | ICD-10-CM | POA: Diagnosis not present

## 2023-08-06 ENCOUNTER — Other Ambulatory Visit (HOSPITAL_COMMUNITY): Payer: Self-pay

## 2023-08-06 ENCOUNTER — Telehealth: Payer: Self-pay | Admitting: General Practice

## 2023-08-06 NOTE — Transitions of Care (Post Inpatient/ED Visit) (Signed)
08/06/2023  Name: [name] Fuentes MRN: [id number] DOB: 10/15/1991  Today's TOC FU Call Status: Today's TOC FU Call Status:: Successful TOC FU Call Completed TOC FU Call Complete Date: 08/06/23 Patient's Name and Date of Birth confirmed.  Transition Care Management Follow-up Telephone Call Date of Discharge: 08/05/23 Discharge Facility: Other ([facility]) Name of Other ([facility]) Discharge Facility: [facility] Type of Discharge: Emergency Department Reason for ED Visit: Other: (pregnancy complication) How have you been since you were released from the hospital?: Better Any questions or concerns?: No  Items Reviewed: Did you receive and understand the discharge instructions provided?: Yes Medications obtained,verified, and reconciled?: Yes (Medications Reviewed) Any new allergies since your discharge?: No Dietary orders reviewed?: NA Do you have support at home?: Yes  Medications Reviewed Today: Medications Reviewed Today     Reviewed by Modesto Charon, RN (Registered Nurse) on 08/06/23 at 1001  Med List Status: <None>   Medication Order Taking? Sig Documenting Provider Last Dose Status Informant  amphetamine-dextroamphetamine (ADDERALL XR) 20 MG 24 hr capsule [id number] No Take 1 capsule (20 mg total) by mouth every morning.  Patient not taking: Reported on 06/23/2023   Jomarie Longs, PA-C Not Taking Active   amphetamine-dextroamphetamine (ADDERALL XR) 20 MG 24 hr capsule [id number] No Take 1 capsule (20 mg total) by mouth every morning.  Patient not taking: Reported on 06/23/2023   Jomarie Longs, PA-C Not Taking Active   amphetamine-dextroamphetamine (ADDERALL XR) 20 MG 24 hr capsule [id number] No Take 1 capsule (20 mg total) by mouth every morning.  Patient not taking: Reported on 06/23/2023   Jomarie Longs, PA-C Not Taking Active   Patient not taking:  Discontinued 12/05/20 1346 (Stop Taking at Discharge) clobetasol cream (TEMOVATE) 0.05 % [id number] No clobetasol  0.05 % topical cream  APPLY 1 APPLICATION ON TO THE SKIN 2 TIMES DAILY AS NEEDED FOR PSORIASIS Breeback, Jade L, PA-C Taking Active   EPINEPHrine (AUVI-Q) 0.3 mg/0.3 mL IJ SOAJ injection [id number] No Inject 0.3 mLs (0.3 mg total) into the muscle as needed for anaphylaxis.  Patient not taking: Reported on 06/23/2023   Ellamae Sia, DO Not Taking Active   escitalopram (LEXAPRO) 20 MG tablet [id number] No Take 1 tablet (20 mg total) by mouth daily. Jomarie Longs, PA-C Taking Active   letrozole [facility]) 2.5 MG tablet [id number] No Take 1 tablet (2.5 mg total) by mouth at bedtime for 5 days  Taking Active   medroxyPROGESTERone (PROVERA) 10 MG tablet [id number] No Take one tablet (10 mg dose) by mouth daily.  Taking Active   pantoprazole (PROTONIX) 40 MG tablet [id number] No Take 1 tablet (40 mg total) by mouth daily. Jomarie Longs, PA-C Taking Active             Home Care and Equipment/Supplies: Were Home Health Services Ordered?: NA Any new equipment or medical supplies ordered?: NA  Functional Questionnaire: Do you need assistance with bathing/showering or dressing?: No Do you need assistance with meal preparation?: No Do you need assistance with eating?: No Do you have difficulty maintaining continence: No Do you need assistance with getting out of bed/getting out of a chair/moving?: No Do you have difficulty managing or taking your medications?: No  Follow up appointments reviewed: PCP Follow-up appointment confirmed?: NA Specialist Hospital Follow-up appointment confirmed?: NA (Patient stated she has been in [place] with OBGYN) Do you need transportation to your follow-up appointment?: No Do you understand care options if your condition(s) worsen?: Yes-patient verbalized understanding  SDOH  Interventions Today    Flowsheet Row Most Recent Value  SDOH Interventions   Transportation Interventions Intervention Not Indicated       SIGNATURE Modesto Charon, RN BSN Nurse Health  Advisor

## 2023-08-07 DIAGNOSIS — N7011 Chronic salpingitis: Secondary | ICD-10-CM | POA: Diagnosis not present

## 2023-08-07 DIAGNOSIS — Z0000 Encounter for general adult medical examination without abnormal findings: Secondary | ICD-10-CM | POA: Diagnosis not present

## 2023-08-07 DIAGNOSIS — O3680X0 Pregnancy with inconclusive fetal viability, not applicable or unspecified: Secondary | ICD-10-CM | POA: Diagnosis not present

## 2023-08-09 DIAGNOSIS — N7011 Chronic salpingitis: Secondary | ICD-10-CM | POA: Diagnosis not present

## 2023-08-09 DIAGNOSIS — O3680X0 Pregnancy with inconclusive fetal viability, not applicable or unspecified: Secondary | ICD-10-CM | POA: Diagnosis not present

## 2023-08-09 DIAGNOSIS — Z0000 Encounter for general adult medical examination without abnormal findings: Secondary | ICD-10-CM | POA: Diagnosis not present

## 2023-08-11 DIAGNOSIS — N83201 Unspecified ovarian cyst, right side: Secondary | ICD-10-CM | POA: Diagnosis not present

## 2023-08-11 DIAGNOSIS — Z9884 Bariatric surgery status: Secondary | ICD-10-CM | POA: Diagnosis not present

## 2023-08-11 DIAGNOSIS — F32A Depression, unspecified: Secondary | ICD-10-CM | POA: Diagnosis not present

## 2023-08-11 DIAGNOSIS — N838 Other noninflammatory disorders of ovary, fallopian tube and broad ligament: Secondary | ICD-10-CM | POA: Diagnosis not present

## 2023-08-11 DIAGNOSIS — Q796 Ehlers-Danlos syndrome, unspecified: Secondary | ICD-10-CM | POA: Diagnosis not present

## 2023-08-11 DIAGNOSIS — O009 Unspecified ectopic pregnancy without intrauterine pregnancy: Secondary | ICD-10-CM | POA: Diagnosis not present

## 2023-08-11 DIAGNOSIS — O00102 Left tubal pregnancy without intrauterine pregnancy: Secondary | ICD-10-CM | POA: Diagnosis not present

## 2023-08-11 DIAGNOSIS — Z79899 Other long term (current) drug therapy: Secondary | ICD-10-CM | POA: Diagnosis not present

## 2023-08-11 DIAGNOSIS — Z3482 Encounter for supervision of other normal pregnancy, second trimester: Secondary | ICD-10-CM | POA: Diagnosis not present

## 2023-08-11 DIAGNOSIS — F410 Panic disorder [episodic paroxysmal anxiety] without agoraphobia: Secondary | ICD-10-CM | POA: Diagnosis not present

## 2023-08-11 DIAGNOSIS — I10 Essential (primary) hypertension: Secondary | ICD-10-CM | POA: Diagnosis not present

## 2023-08-11 DIAGNOSIS — Z0000 Encounter for general adult medical examination without abnormal findings: Secondary | ICD-10-CM | POA: Diagnosis not present

## 2023-08-11 DIAGNOSIS — K219 Gastro-esophageal reflux disease without esophagitis: Secondary | ICD-10-CM | POA: Diagnosis not present

## 2023-08-11 DIAGNOSIS — O00109 Unspecified tubal pregnancy without intrauterine pregnancy: Secondary | ICD-10-CM | POA: Diagnosis not present

## 2023-08-11 DIAGNOSIS — L409 Psoriasis, unspecified: Secondary | ICD-10-CM | POA: Diagnosis not present

## 2023-08-11 DIAGNOSIS — E282 Polycystic ovarian syndrome: Secondary | ICD-10-CM | POA: Diagnosis not present

## 2023-08-11 DIAGNOSIS — Z3483 Encounter for supervision of other normal pregnancy, third trimester: Secondary | ICD-10-CM | POA: Diagnosis not present

## 2023-08-11 DIAGNOSIS — O3680X0 Pregnancy with inconclusive fetal viability, not applicable or unspecified: Secondary | ICD-10-CM | POA: Diagnosis not present

## 2023-08-15 DIAGNOSIS — Z3482 Encounter for supervision of other normal pregnancy, second trimester: Secondary | ICD-10-CM | POA: Diagnosis not present

## 2023-08-15 DIAGNOSIS — O00102 Left tubal pregnancy without intrauterine pregnancy: Secondary | ICD-10-CM | POA: Diagnosis not present

## 2023-08-15 DIAGNOSIS — Z3483 Encounter for supervision of other normal pregnancy, third trimester: Secondary | ICD-10-CM | POA: Diagnosis not present

## 2023-08-18 DIAGNOSIS — O00102 Left tubal pregnancy without intrauterine pregnancy: Secondary | ICD-10-CM | POA: Diagnosis not present

## 2023-08-21 DIAGNOSIS — R102 Pelvic and perineal pain: Secondary | ICD-10-CM | POA: Diagnosis not present

## 2023-08-21 DIAGNOSIS — K661 Hemoperitoneum: Secondary | ICD-10-CM | POA: Diagnosis not present

## 2023-08-21 DIAGNOSIS — Z9889 Other specified postprocedural states: Secondary | ICD-10-CM | POA: Diagnosis not present

## 2023-08-21 DIAGNOSIS — F32A Depression, unspecified: Secondary | ICD-10-CM | POA: Diagnosis not present

## 2023-08-21 DIAGNOSIS — O00112 Left tubal pregnancy with intrauterine pregnancy: Secondary | ICD-10-CM | POA: Diagnosis not present

## 2023-08-21 DIAGNOSIS — O00102 Left tubal pregnancy without intrauterine pregnancy: Secondary | ICD-10-CM | POA: Diagnosis not present

## 2023-08-21 DIAGNOSIS — Z79899 Other long term (current) drug therapy: Secondary | ICD-10-CM | POA: Diagnosis not present

## 2023-08-21 DIAGNOSIS — I10 Essential (primary) hypertension: Secondary | ICD-10-CM | POA: Diagnosis not present

## 2023-08-21 DIAGNOSIS — K219 Gastro-esophageal reflux disease without esophagitis: Secondary | ICD-10-CM | POA: Diagnosis not present

## 2023-08-21 DIAGNOSIS — F419 Anxiety disorder, unspecified: Secondary | ICD-10-CM | POA: Diagnosis not present

## 2023-08-21 DIAGNOSIS — N83201 Unspecified ovarian cyst, right side: Secondary | ICD-10-CM | POA: Diagnosis not present

## 2023-08-21 DIAGNOSIS — E282 Polycystic ovarian syndrome: Secondary | ICD-10-CM | POA: Diagnosis not present

## 2023-09-09 DIAGNOSIS — M79605 Pain in left leg: Secondary | ICD-10-CM | POA: Diagnosis not present

## 2023-09-09 DIAGNOSIS — Z9079 Acquired absence of other genital organ(s): Secondary | ICD-10-CM | POA: Diagnosis not present

## 2023-09-10 DIAGNOSIS — M79605 Pain in left leg: Secondary | ICD-10-CM | POA: Diagnosis not present

## 2023-09-18 ENCOUNTER — Other Ambulatory Visit (HOSPITAL_COMMUNITY): Payer: Self-pay

## 2023-10-08 DIAGNOSIS — N912 Amenorrhea, unspecified: Secondary | ICD-10-CM | POA: Diagnosis not present

## 2023-10-08 DIAGNOSIS — R8290 Unspecified abnormal findings in urine: Secondary | ICD-10-CM | POA: Diagnosis not present

## 2023-10-08 DIAGNOSIS — R1012 Left upper quadrant pain: Secondary | ICD-10-CM | POA: Diagnosis not present

## 2023-10-08 DIAGNOSIS — Z01419 Encounter for gynecological examination (general) (routine) without abnormal findings: Secondary | ICD-10-CM | POA: Diagnosis not present

## 2023-10-08 DIAGNOSIS — Z124 Encounter for screening for malignant neoplasm of cervix: Secondary | ICD-10-CM | POA: Diagnosis not present

## 2023-10-27 ENCOUNTER — Telehealth: Payer: 59 | Admitting: Physician Assistant

## 2023-10-27 DIAGNOSIS — J020 Streptococcal pharyngitis: Secondary | ICD-10-CM | POA: Diagnosis not present

## 2023-10-27 MED ORDER — AMOXICILLIN 500 MG PO TABS: 500.0000 mg | ORAL_TABLET | Freq: Two times a day (BID) | ORAL | 0 refills | Status: AC

## 2023-10-27 NOTE — Progress Notes (Signed)
I have spent 5 minutes in review of e-visit questionnaire, review and updating patient chart, medical decision making and response to patient.   Mia Milan Cody Jacklynn Dehaas, PA-C    

## 2023-10-27 NOTE — Progress Notes (Signed)

## 2023-10-28 ENCOUNTER — Encounter (HOSPITAL_BASED_OUTPATIENT_CLINIC_OR_DEPARTMENT_OTHER): Payer: Self-pay | Admitting: Emergency Medicine

## 2023-10-28 ENCOUNTER — Emergency Department (HOSPITAL_BASED_OUTPATIENT_CLINIC_OR_DEPARTMENT_OTHER)
Admission: EM | Admit: 2023-10-28 | Discharge: 2023-10-28 | Disposition: A | Discharge status: Home / Self Care | Payer: 59 | Attending: Emergency Medicine | Admitting: Emergency Medicine

## 2023-10-28 ENCOUNTER — Emergency Department (HOSPITAL_BASED_OUTPATIENT_CLINIC_OR_DEPARTMENT_OTHER): Payer: 59

## 2023-10-28 ENCOUNTER — Ambulatory Visit: Payer: Self-pay | Admitting: Physician Assistant

## 2023-10-28 ENCOUNTER — Other Ambulatory Visit: Payer: Self-pay

## 2023-10-28 ENCOUNTER — Other Ambulatory Visit (HOSPITAL_COMMUNITY): Payer: Self-pay

## 2023-10-28 DIAGNOSIS — D649 Anemia, unspecified: Secondary | ICD-10-CM | POA: Insufficient documentation

## 2023-10-28 DIAGNOSIS — R0989 Other specified symptoms and signs involving the circulatory and respiratory systems: Secondary | ICD-10-CM | POA: Diagnosis not present

## 2023-10-28 DIAGNOSIS — Z1152 Encounter for screening for COVID-19: Secondary | ICD-10-CM | POA: Diagnosis not present

## 2023-10-28 DIAGNOSIS — R059 Cough, unspecified: Secondary | ICD-10-CM | POA: Diagnosis not present

## 2023-10-28 DIAGNOSIS — J101 Influenza due to other identified influenza virus with other respiratory manifestations: Secondary | ICD-10-CM | POA: Insufficient documentation

## 2023-10-28 DIAGNOSIS — R531 Weakness: Secondary | ICD-10-CM | POA: Diagnosis not present

## 2023-10-28 LAB — CBC
HCT: 35.6 % — ABNORMAL LOW (ref 36.0–46.0)
Hemoglobin: 11.3 g/dL — ABNORMAL LOW (ref 12.0–15.0)
MCH: 26.3 pg (ref 26.0–34.0)
MCHC: 31.7 g/dL (ref 30.0–36.0)
MCV: 83 fL (ref 80.0–100.0)
Platelets: 221 10*3/uL (ref 150–400)
RBC: 4.29 MIL/uL (ref 3.87–5.11)
RDW: 14.6 % (ref 11.5–15.5)
WBC: 4.5 10*3/uL (ref 4.0–10.5)
nRBC: 0 % (ref 0.0–0.2)

## 2023-10-28 LAB — RESP PANEL BY RT-PCR (RSV, FLU A&B, COVID)  RVPGX2
Influenza A by PCR: POSITIVE — AB
Influenza B by PCR: NEGATIVE
Resp Syncytial Virus by PCR: NEGATIVE
SARS Coronavirus 2 by RT PCR: NEGATIVE

## 2023-10-28 LAB — GROUP A STREP BY PCR: Group A Strep by PCR: NOT DETECTED

## 2023-10-28 MED ORDER — GUAIFENESIN ER 600 MG PO TB12
600.0000 mg | ORAL_TABLET | Freq: Two times a day (BID) | ORAL | 0 refills | Status: DC
  Filled 2023-10-28: qty 60, 30d supply, fill #0

## 2023-10-28 MED ORDER — FLUTICASONE PROPIONATE 50 MCG/ACT NA SUSP
2.0000 | Freq: Every day | NASAL | 0 refills | Status: DC
  Filled 2023-10-28: qty 18.2, fill #0

## 2023-10-28 MED ORDER — BENZONATATE 100 MG PO CAPS: 100.0000 mg | ORAL_CAPSULE | Freq: Three times a day (TID) | ORAL | 0 refills | Status: DC

## 2023-10-28 MED ORDER — FLUTICASONE PROPIONATE 50 MCG/ACT NA SUSP: 2.0000 | Freq: Every day | NASAL | 0 refills | Status: DC

## 2023-10-28 MED ORDER — BENZONATATE 100 MG PO CAPS
100.0000 mg | ORAL_CAPSULE | Freq: Three times a day (TID) | ORAL | 0 refills | Status: DC
  Filled 2023-10-28: qty 21, 7d supply, fill #0

## 2023-10-28 MED ORDER — GUAIFENESIN ER 600 MG PO TB12: 600.0000 mg | ORAL_TABLET | Freq: Two times a day (BID) | ORAL | 0 refills | Status: DC

## 2023-10-28 MED ORDER — IBUPROFEN 800 MG PO TABS
800.0000 mg | ORAL_TABLET | Freq: Once | ORAL | Status: AC
  Administered 2023-10-28: 800 mg via ORAL
  Filled 2023-10-28: qty 1

## 2023-10-28 MED ORDER — GUAIFENESIN ER 600 MG PO TB12: 600.0000 mg | ORAL_TABLET | Freq: Two times a day (BID) | ORAL | 0 refills | Status: AC

## 2023-10-28 NOTE — ED Provider Notes (Cosign Needed Addendum)
[place] [facility]PART[facility] AT [facility] Provider Note   CSN: [id number] Arrival date & time: 10/28/23  1354     History  Chief Complaint  Patient presents with   Shortness of Breath    [name] Fuentes is a 32 y.o. female presents today for sore throat, cough, fever x 2 days.  Patient states she had a virtual visit with her primary care yesterday and was started on amoxicillin for presumed strep throat.  Patient states last night she began having chills, shortness of breath, body aches and "feeling like I am choking on mucus" as well.  Patient has been taking Tylenol, Motrin, NyQuil, DayQuil, and amoxicillin with mild relief.  Patient denies vomiting, diarrhea, abdominal pain, headache, voice change, or urinary symptoms.  Patient does endorse some fatigue, decreased appetite, mild nausea and describes her mucus as green with some blood tinge.  States she has been around people who have flu and strep throat.  Patient states she has intermittent dizziness and a history of anemia and is requesting CBC.   Shortness of Breath Associated symptoms: cough, fever and sore throat        Home Medications Prior to Admission medications   Medication Sig Start Date End Date Taking? Authorizing Provider  amoxicillin (AMOXIL) 500 MG tablet Take 1 tablet (500 mg total) by mouth 2 (two) times daily for 10 days. 10/27/23 11/06/23  Waldon Merl, PA-C  amphetamine-dextroamphetamine (ADDERALL XR) 20 MG 24 hr capsule Take 1 capsule (20 mg total) by mouth every morning. Patient not taking: Reported on 06/23/2023 06/18/23   Jomarie Longs, PA-C  amphetamine-dextroamphetamine (ADDERALL XR) 20 MG 24 hr capsule Take 1 capsule (20 mg total) by mouth every morning. Patient not taking: Reported on 06/23/2023 04/18/23   Jomarie Longs, PA-C  amphetamine-dextroamphetamine (ADDERALL XR) 20 MG 24 hr capsule Take 1 capsule (20 mg total) by mouth every morning. Patient not taking: Reported on  06/23/2023 05/18/23   Jomarie Longs, PA-C  benzonatate (TESSALON) 100 MG capsule Take 1 capsule (100 mg total) by mouth every 8 (eight) hours. 10/28/23   Dolphus Jenny, PA-C  clobetasol cream (TEMOVATE) 0.05 % clobetasol 0.05 % topical cream  APPLY 1 APPLICATION ON TO THE SKIN 2 TIMES DAILY AS NEEDED FOR PSORIASIS 04/18/23   Caleen Essex, Jade L, PA-C  EPINEPHrine (AUVI-Q) 0.3 mg/0.3 mL IJ SOAJ injection Inject 0.3 mLs (0.3 mg total) into the muscle as needed for anaphylaxis. Patient not taking: Reported on 06/23/2023 05/03/20   Ellamae Sia, DO  escitalopram (LEXAPRO) 20 MG tablet Take 1 tablet (20 mg total) by mouth daily. 04/18/23   Breeback, Jade L, PA-C  fluticasone (FLONASE) 50 MCG/ACT nasal spray Place 2 sprays into both nostrils daily. 10/28/23   Dolphus Jenny, PA-C  guaiFENesin (MUCINEX) 600 MG 12 hr tablet Take 1 tablet (600 mg total) by mouth 2 (two) times daily. 10/28/23   Dolphus Jenny, PA-C  letrozole [facility]) 2.5 MG tablet Take 1 tablet (2.5 mg total) by mouth at bedtime for 5 days 06/18/23     medroxyPROGESTERone (PROVERA) 10 MG tablet Take one tablet (10 mg dose) by mouth daily. 05/02/23     pantoprazole (PROTONIX) 40 MG tablet Take 1 tablet (40 mg total) by mouth daily. 12/04/22   Breeback, Jade L, PA-C  buPROPion (WELLBUTRIN XL) 150 MG 24 hr tablet Take 1 tablet (150 mg total) by mouth every morning. Patient not taking: No sig reported 08/07/20 12/05/20  Jomarie Longs,  PA-C      Allergies    Other, Accutane [isotretinoin], Buspar [buspirone], and Metformin and related    Review of Systems   Review of Systems  Constitutional:  Positive for appetite change, chills, fatigue and fever.  HENT:  Positive for congestion and sore throat.   Respiratory:  Positive for cough and shortness of breath.   Gastrointestinal:  Positive for nausea.    Physical Exam Updated Vital Signs BP (!) 156/94 (BP Location: Left Arm)   Pulse 93   Temp (!) 100.8 F (38.2 C)   Resp 16   Ht 5\' 2"  (1.575  m)   Wt 136.1 kg   LMP  (LMP Unknown)   SpO2 99%   BMI 54.87 kg/m  Physical Exam Vitals and nursing note reviewed.  Constitutional:      General: She is not in acute distress.    Appearance: She is well-developed. She is obese. She is ill-appearing. She is not toxic-appearing.  HENT:     Head: Normocephalic and atraumatic.     Jaw: There is normal jaw occlusion. No trismus.     Right Ear: External ear normal.     Left Ear: External ear normal.     Nose: Congestion present.     Mouth/Throat:     Mouth: Mucous membranes are moist.     Pharynx: Oropharynx is clear. Uvula midline. No pharyngeal swelling, oropharyngeal exudate, uvula swelling or postnasal drip.     Tonsils: No tonsillar exudate or tonsillar abscesses.  Eyes:     Extraocular Movements: Extraocular movements intact.     Conjunctiva/sclera: Conjunctivae normal.     Pupils: Pupils are equal, round, and reactive to light.  Neck:     Thyroid: No thyromegaly.     Vascular: No JVD.     Trachea: No tracheal deviation.  Cardiovascular:     Rate and Rhythm: Normal rate and regular rhythm.     Pulses: Normal pulses.     Heart sounds: Normal heart sounds. No murmur heard. Pulmonary:     Effort: Pulmonary effort is normal. No tachypnea or respiratory distress.     Breath sounds: Normal breath sounds. No decreased breath sounds, wheezing or rhonchi.  Abdominal:     Palpations: Abdomen is soft.     Tenderness: There is no abdominal tenderness.  Musculoskeletal:        General: No swelling.     Cervical back: Neck supple.  Lymphadenopathy:     Cervical: Cervical adenopathy present.  Skin:    General: Skin is warm and dry.     Capillary Refill: Capillary refill takes less than 2 seconds.  Neurological:     General: No focal deficit present.     Mental Status: She is alert.     Motor: No weakness.  Psychiatric:        Mood and Affect: Mood normal.     ED Results / Procedures / Treatments   Labs (all labs ordered are  listed, but only abnormal results are displayed) Labs Reviewed  RESP PANEL BY RT-PCR (RSV, FLU A&B, COVID)  [id number] - Abnormal; Notable for the following components:      Result Value   Influenza A by PCR POSITIVE (*)    All other components within normal limits  GROUP A STREP BY PCR    EKG None  Radiology DG Chest 2 View Result Date: 10/28/2023 CLINICAL DATA:  Cough and weakness EXAM: CHEST - 2 VIEW COMPARISON:  08/05/2018 FINDINGS: Slightly lower lung volumes  and prominent central vascularity. Stable heart size. Negative for edema, definite pneumonia, collapse or consolidation. No effusion or pneumothorax. Trachea midline. IMPRESSION: Lower volume exam without acute process. Electronically Signed   By: Judie Petit.  Shick M.D.   On: 10/28/2023 14:49    Procedures Procedures    Medications Ordered in ED Medications - No data to display  ED Course/ Medical Decision Making/ A&P                                 Medical Decision Making Amount and/or Complexity of Data Reviewed Labs: ordered. Radiology: ordered.  Risk OTC drugs. Prescription drug management.   This patient presents to the ED with chief complaint(s) of sore throat, fever body aches with pertinent past medical history of none which further complicates the presenting complaint. The complaint involves an extensive differential diagnosis and also carries with it a high risk of complications and morbidity.    The differential diagnosis includes strep throat, flu, COVID, RSV, pneumonia  Additional history obtained: Additional history obtained from family Records reviewed Primary Care Documents  ED Course and Reassessment:   Independent labs interpretation:  The following labs were independently interpreted:  Respiratory panel: Flu A+ Strep PCR: Negative CBC: Mild anemia 11.3  Independent visualization of imaging: - I independently visualized the following imaging with scope of interpretation limited to determining  acute life threatening conditions related to emergency care: Chest x-ray, which revealed lower volume exam without acute process  Consultation: - Consulted or discussed management/test interpretation w/ external professional: None  Consideration for admission or further workup: Considered for mission further workup however patient's vital signs, physical exam labs, and imaging have been reassuring.  Patient's symptoms due to influenza A infection.  Patient will be treated outpatient symptomatically with Mucinex, Flonase and Tessalon Perles.  Patient will continue taking over-the-counter Tylenol as needed for fever and pain.  Patient should follow-up with PCP if symptoms persist for further evaluation and workup.        Final Clinical Impression(s) / ED Diagnoses Final diagnoses:  Influenza A    Rx / DC Orders ED Discharge Orders          Ordered    fluticasone (FLONASE) 50 MCG/ACT nasal spray  Daily,   Status:  Discontinued        10/28/23 1459    benzonatate (TESSALON) 100 MG capsule  Every 8 hours,   Status:  Discontinued        10/28/23 1459    guaiFENesin (MUCINEX) 600 MG 12 hr tablet  2 times daily,   Status:  Discontinued        10/28/23 1459    benzonatate (TESSALON) 100 MG capsule  Every 8 hours        10/28/23 1500    fluticasone (FLONASE) 50 MCG/ACT nasal spray  Daily        10/28/23 1500    guaiFENesin (MUCINEX) 600 MG 12 hr tablet  2 times daily        10/28/23 1500              Dolphus Jenny, PA-C 10/28/23 1500    Dolphus Jenny, PA-C 10/28/23 1525    [address] [place], [place] 10/29/23 [phone]

## 2023-10-28 NOTE — Telephone Encounter (Addendum)
Chief Complaint: SOB Symptoms: can't catch breath, body aches, chills, cough Frequency: constant  Disposition: [x] ED /[] Urgent Care (no appt availability in office) / [] Appointment(In office/virtual)/ []  [facility]/ [] Home Care/ [] Refused Recommended Disposition /[] [facility]/ []  Follow-up with PCP Additional Notes: Pt complaining of SOB. Pt had virtual visit yesterday and was prescribed amoxicillin for strep throat like symptoms.   Pt stated she "felt worse today." SOB upon moving or sitting. Throat and chest hurt so bad pt can't cough deep to move the mucus. Pt stated when it is productive she "tastes blood and infection." Pt has also been using her daughter's albuterol inhaler every 4 hours for some relief. E2C2 nurse instructed pt to go to ED. Pt stated she understood the instructions and will call around to get someone to take her.         Copied from CRM [id number]. Topic: Clinical - Red Word Triage >> Oct 28, 2023 12:33 PM Lovey Newcomer R wrote: Reason for Triage: Can't breathe well. Had e visit yesterday, was prescribed Amoxicillin. Its getting worse. Feels like she has strep, tons of coughing and green and bloody/brown mucous. Reason for Disposition  [1] MODERATE difficulty breathing (e.g., speaks in phrases, SOB even at rest, pulse 100-120) AND [2] still present when not coughing  Answer Assessment - Initial Assessment Questions 1. RESPIRATORY STATUS: "Describe your breathing?" (e.g., wheezing, shortness of breath, unable to speak, severe coughing)      Siting or moving short of breath 2. ONSET: "When did this breathing problem begin?"      Last night 3. PATTERN "Does the difficult breathing come and go, or has it been constant since it started?"      constant 4. SEVERITY: "How bad is your breathing?" (e.g., mild, moderate, severe)    - MILD: No SOB at rest, mild SOB with walking, speaks normally in sentences, can lie down, no retractions, pulse < 100.    -  MODERATE: SOB at rest, SOB with minimal exertion and prefers to sit, cannot lie down flat, speaks in phrases, mild retractions, audible wheezing, pulse 100-120.    - SEVERE: Very SOB at rest, speaks in single words, struggling to breathe, sitting hunched forward, retractions, pulse > 120      severe 5. RECURRENT SYMPTOM: "Have you had difficulty breathing before?" If Yes, ask: "When was the last time?" and "What happened that time?"      no 6. CARDIAC HISTORY: "Do you have any history of heart disease?" (e.g., heart attack, angina, bypass surgery, angioplasty)      no 7. LUNG HISTORY: "Do you have any history of lung disease?"  (e.g., pulmonary embolus, asthma, emphysema)    "Something on right lung from covid" 8. CAUSE: "What do you think is causing the breathing problem?"      strep 9. OTHER SYMPTOMS: "Do you have any other symptoms? (e.g., dizziness, runny nose, cough, chest pain, fever)     Dizzy ,cough, pain in chest, body aches 10. O2 SATURATION MONITOR:  "Do you use an oxygen saturation monitor (pulse oximeter) at home?" If Yes, ask: "What is your reading (oxygen level) today?" "What is your usual oxygen saturation reading?" (e.g., 95%)       na 11. PREGNANCY: "Is there any chance you are pregnant?" "When was your last menstrual period?"       Possibility  12. TRAVEL: "Have you traveled out of the country in the last month?" (e.g., travel history, exposures)  no  Protocols used: Breathing Difficulty-A-AH, Cough - Acute Productive-A-AH

## 2023-10-28 NOTE — Discharge Instructions (Addendum)
Today you were seen for influenza A.  Please pick up your medication and take as prescribed.  You may continue taking Tylenol Motrin as needed for fever and pain.  Please do not take Motrin for greater than 5 days in a row as this may cause rebound headaches.  Please discontinue taking your amoxicillin as this infection is likely viral and not bacterial.  Please follow-up with your PCP if your symptoms persist for further evaluation and workup.  Thank you for letting us treat you today. After reviewing your labs and imaging, I feel you are safe to go home. Please follow up with your PCP in the next several days and provide them with your records from this visit. Return to the Emergency Room if pain becomes severe or symptoms worsen.

## 2023-10-28 NOTE — ED Triage Notes (Signed)
Pt reports dx of strep throat yesterday and was started on Amoxicillin, reports ShoB, body aches and productive cough that started last night, has been using Albuterol and OTC pain relievers; RT eval in triage

## 2023-10-28 NOTE — ED Notes (Signed)
 Reviewed discharge instructions, follow up and medications with pt. Pt states understanding. Ambulatory at discharge

## 2023-10-30 ENCOUNTER — Telehealth: Payer: 59 | Admitting: Physician Assistant

## 2023-10-30 DIAGNOSIS — J208 Acute bronchitis due to other specified organisms: Secondary | ICD-10-CM

## 2023-10-30 MED ORDER — ALBUTEROL SULFATE HFA 108 (90 BASE) MCG/ACT IN AERS: 1.0000 | INHALATION_SPRAY | Freq: Four times a day (QID) | RESPIRATORY_TRACT | 0 refills | Status: DC | PRN

## 2023-10-30 MED ORDER — PREDNISONE 20 MG PO TABS: 40.0000 mg | ORAL_TABLET | Freq: Every day | ORAL | 0 refills | Status: DC

## 2023-10-30 NOTE — Progress Notes (Signed)
E-Visit for Cough   We are sorry that you are not feeling well.  Here is how we plan to help!  Based on your presentation I believe you most likely have A cough due to a virus.  This is called viral bronchitis and is best treated by rest, plenty of fluids and control of the cough.  You may use Ibuprofen or Tylenol as directed to help your symptoms.  Yours would be viral bronchitis secondary to influenza A.   In addition you may use a prescription inhaler: Albuterol inhaler Use 1-2 puffs every 6 hours as needed for shortness of breath, chest tightness, and/or wheezing.  Prednisone 20mg  Take 2 tablets (40mg ) daily for 5 days.  From your responses in the eVisit questionnaire you describe inflammation in the upper respiratory tract which is causing a significant cough.  This is commonly called Bronchitis and has four common causes:   Allergies Viral Infections Acid Reflux Bacterial Infection Allergies, viruses and acid reflux are treated by controlling symptoms or eliminating the cause. An example might be a cough caused by taking certain blood pressure medications. You stop the cough by changing the medication. Another example might be a cough caused by acid reflux. Controlling the reflux helps control the cough.  USE OF BRONCHODILATOR ("RESCUE") INHALERS: There is a risk from using your bronchodilator too frequently.  The risk is that over-reliance on a medication which only relaxes the muscles surrounding the breathing tubes can reduce the effectiveness of medications prescribed to reduce swelling and congestion of the tubes themselves.  Although you feel brief relief from the bronchodilator inhaler, your asthma may actually be worsening with the tubes becoming more swollen and filled with mucus.  This can delay other crucial treatments, such as oral steroid medications. If you need to use a bronchodilator inhaler daily, several times per day, you should discuss this with your provider.  There are  probably better treatments that could be used to keep your asthma under control.     HOME CARE Only take medications as instructed by your medical team. Complete the entire course of an antibiotic. Drink plenty of fluids and get plenty of rest. Avoid close contacts especially the very young and the elderly Cover your mouth if you cough or cough into your sleeve. Always remember to wash your hands A steam or ultrasonic humidifier can help congestion.   GET HELP RIGHT AWAY IF: You develop worsening fever. You become short of breath You cough up blood. Your symptoms persist after you have completed your treatment plan MAKE SURE YOU  Understand these instructions. Will watch your condition. Will get help right away if you are not doing well or get worse.    Thank you for choosing an e-visit.  Your e-visit answers were reviewed by a board certified advanced clinical practitioner to complete your personal care plan. Depending upon the condition, your plan could have included both over the counter or prescription medications.  Please review your pharmacy choice. Make sure the pharmacy is open so you can pick up prescription now. If there is a problem, you may contact your provider through [employer] and have the prescription routed to another pharmacy.  Your safety is important to [place]. If you have drug allergies check your prescription carefully.   For the next 24 hours you can use MyChart to ask questions about today's visit, request a non-urgent call back, or ask for a work or school excuse. You will get an email in the next two  days asking about your experience. I hope that your e-visit has been valuable and will speed your recovery.   I have spent 5 minutes in review of e-visit questionnaire, review and updating patient chart, medical decision making and response to patient.   Margaretann Loveless, PA-C

## 2023-11-12 ENCOUNTER — Ambulatory Visit: Payer: 59 | Admitting: Physician Assistant

## 2023-11-19 DIAGNOSIS — Z3169 Encounter for other general counseling and advice on procreation: Secondary | ICD-10-CM | POA: Insufficient documentation

## 2023-11-19 DIAGNOSIS — E282 Polycystic ovarian syndrome: Secondary | ICD-10-CM | POA: Diagnosis not present

## 2023-12-26 ENCOUNTER — Other Ambulatory Visit: Payer: Self-pay | Admitting: Physician Assistant

## 2023-12-26 ENCOUNTER — Other Ambulatory Visit (HOSPITAL_COMMUNITY): Payer: Self-pay

## 2023-12-26 DIAGNOSIS — K219 Gastro-esophageal reflux disease without esophagitis: Secondary | ICD-10-CM

## 2023-12-26 MED ORDER — LETROZOLE 2.5 MG PO TABS
ORAL_TABLET | ORAL | 0 refills | Status: DC
  Filled 2023-12-26: qty 5, 3d supply, fill #0

## 2023-12-29 ENCOUNTER — Other Ambulatory Visit (HOSPITAL_COMMUNITY): Payer: Self-pay

## 2023-12-29 MED ORDER — LETROZOLE 2.5 MG PO TABS
5.0000 mg | ORAL_TABLET | ORAL | 0 refills | Status: DC
  Filled 2023-12-29: qty 10, 30d supply, fill #0
  Filled 2023-12-29: qty 10, 28d supply, fill #0

## 2024-01-01 ENCOUNTER — Other Ambulatory Visit (HOSPITAL_COMMUNITY): Payer: Self-pay

## 2024-01-01 MED ORDER — FIRST-PROGESTERONE VGS 100 MG VA SUPP
VAGINAL | 1 refills | Status: DC
  Filled 2024-01-01: qty 30, 30d supply, fill #0

## 2024-01-01 MED ORDER — PANTOPRAZOLE SODIUM 40 MG PO TBEC
40.0000 mg | DELAYED_RELEASE_TABLET | Freq: Every day | ORAL | 3 refills | Status: AC
  Filled 2024-01-01: qty 90, 90d supply, fill #0

## 2024-01-02 ENCOUNTER — Other Ambulatory Visit (HOSPITAL_COMMUNITY): Payer: Self-pay

## 2024-01-02 MED ORDER — ENDOMETRIN 100 MG VA INST
VAGINAL_INSERT | VAGINAL | 3 refills | Status: AC
  Filled 2024-01-02: qty 30, 30d supply, fill #0

## 2024-01-02 MED ORDER — ENDOMETRIN 100 MG VA INST
VAGINAL_INSERT | VAGINAL | 1 refills | Status: DC
Start: 2024-01-02 — End: 2024-02-04
  Filled 2024-01-02: qty 60, 30d supply, fill #0

## 2024-01-07 DIAGNOSIS — N926 Irregular menstruation, unspecified: Secondary | ICD-10-CM | POA: Diagnosis not present

## 2024-01-07 DIAGNOSIS — K661 Hemoperitoneum: Secondary | ICD-10-CM | POA: Diagnosis not present

## 2024-01-07 DIAGNOSIS — O00102 Left tubal pregnancy without intrauterine pregnancy: Secondary | ICD-10-CM | POA: Diagnosis not present

## 2024-01-20 ENCOUNTER — Other Ambulatory Visit (HOSPITAL_COMMUNITY): Payer: Self-pay

## 2024-01-21 ENCOUNTER — Other Ambulatory Visit (HOSPITAL_COMMUNITY): Payer: Self-pay

## 2024-02-04 ENCOUNTER — Encounter: Payer: Self-pay | Admitting: Family Medicine

## 2024-02-04 ENCOUNTER — Ambulatory Visit: Admitting: Family Medicine

## 2024-02-04 VITALS — BP 137/92 | HR 71 | Temp 98.0°F | Ht 62.0 in | Wt 329.7 lb

## 2024-02-04 DIAGNOSIS — J029 Acute pharyngitis, unspecified: Secondary | ICD-10-CM

## 2024-02-04 DIAGNOSIS — J020 Streptococcal pharyngitis: Secondary | ICD-10-CM

## 2024-02-04 LAB — POCT RAPID STREP A (OFFICE): Rapid Strep A Screen: NEGATIVE

## 2024-02-04 NOTE — Progress Notes (Signed)
 [name] Fuentes - 33 y.o. female MRN [id number]  Date of birth: 1991-05-26  Subjective Chief Complaint  Patient presents with   Sore Throat   Generalized Body Aches   Headache    HPI [name] Fuentes is a 33 y.o. female here today with with complaint of sore throat and headache.  She had GI symptoms last week with nausea, vomiting, diarrhea.  This has improved but now has these other symptoms.  She did have fever initially and now feels that she still has low grade fever.  She has had body aches.  She denies dyspnea or wheezing.  She is using OTC analgesics which do provide some relief.   ROS:  A comprehensive ROS was completed and negative except as noted per HPI  Allergies  Allergen Reactions   Other Anaphylaxis, Hives, Diarrhea and Nausea And Vomiting    Reaction to red meat   Accutane [Isotretinoin] Other (See Comments)    ? bruising   Buspar [Buspirone]     Made her feel funny   Metformin And Related Diarrhea and Nausea Only    Past Medical History:  Diagnosis Date   Allergy to alpha-gal    Anemia    hx of low iron   Anxiety    Dyspnea    on exertion    GERD (gastroesophageal reflux disease)    History of kidney stones 10/28/2019   passed    Hydradenitis    Hypertension    no longer on medication   Obese    PCOS (polycystic ovarian syndrome)    Psoriasis     Past Surgical History:  Procedure Laterality Date   ANKLE RECONSTRUCTION Left    L ankle; plates and screws in place    CHOLECYSTECTOMY N/A 12/01/2018   Procedure: LAPAROSCOPIC CHOLECYSTECTOMY WITH INTRAOPERATIVE CHOLANGIOGRAM;  Surgeon: Glenna Fellows, MD;  Location: [facility] OR;  Service: General;  Laterality: N/A;   LAPAROSCOPIC GASTRIC SLEEVE RESECTION N/A 09/28/2018   Procedure: LAPAROSCOPIC GASTRIC SLEEVE RESECTION, UPPER ENDO, ERAS PATHWAY;  Surgeon: Glenna Fellows, MD;  Location: [facility] ORS;  Service: General;  Laterality: N/A;   OPEN REDUCTION INTERNAL FIXATION (ORIF) FOOT LISFRANC FRACTURE  Right 12/05/2020   Procedure: OPEN TREATMENT OF RIGHT LISFRANC, SECOND TARSOMETATARSAL JOINT AND SECOND METATARSAL BASE FRACTURE;  Surgeon: Terance Hart, MD;  Location: [facility] OR;  Service: Orthopedics;  Laterality: Right;  LENGTH OF SURGERY: 2 HOURS   Tempano Plasty     TONSILLECTOMY     and adenoidectomy   TYMPANOSTOMY TUBE PLACEMENT     several sets   WISDOM TOOTH EXTRACTION      Social History   Socioeconomic History   Marital status: Legally Separated    Spouse name: Not on file   Number of children: Not on file   Years of education: Not on file   Highest education level: Not on file  Occupational History   Not on file  Tobacco Use   Smoking status: Never   Smokeless tobacco: Never  Vaping Use   Vaping status: Some Days  Substance and Sexual Activity   Alcohol use: Yes    Comment: occ   Drug use: No   Sexual activity: Not on file  Other Topics Concern   Not on file  Social History Narrative   Not on file   Social Drivers of Health   Financial Resource Strain: Low Risk  (11/19/2023)   Received [facility]   Overall Financial Resource Strain (CARDIA)    Difficulty of Paying  Living Expenses: Not hard at all  Food Insecurity: No Food Insecurity (11/19/2023)   Received from [facility]   Hunger Vital Sign    Worried About Running Out of Food in the Last Year: Never true    Ran Out of Food in the Last Year: Never true  Transportation Needs: No Transportation Needs (11/19/2023)   Received from [facility] - Transportation    Lack of Transportation (Medical): No    Lack of Transportation (Non-Medical): No  Physical Activity: Not on file  Stress: Stress Concern Present (08/11/2023)   Received from [facility] - Occupational Stress Questionnaire    Feeling of Stress : To some extent  Social Connections: Unknown (03/04/2022)   [facility] from [facility]   Social Network    Social  Network: Not on file    Family History  Problem Relation Age of Onset   Hypertension Mother    Hypertension Father    Hypertension Brother    Hypertension Maternal Grandfather    Cancer Paternal Grandfather        liver   Stroke Paternal Grandfather    Allergic rhinitis Neg Hx    Angioedema Neg Hx    Asthma Neg Hx    Eczema Neg Hx    Immunodeficiency Neg Hx    Urticaria Neg Hx     Health Maintenance  Topic Date Due   HIV Screening  Never done   HPV VACCINES (3 - 2-dose series) 10/12/2008   COVID-19 Vaccine (3 - 2024-25 season) 07/06/2023   INFLUENZA VACCINE  06/04/2024   Cervical Cancer Screening (HPV/Pap Cotest)  12/08/2025   DTaP/Tdap/Td (7 - Td or Tdap) 03/13/2027   Hepatitis C Screening  Completed     ----------------------------------------------------------------------------------------------------------------------------------------------------------------------------------------------------------------- Physical Exam BP (!) 137/92 (BP Location: Left Wrist, Patient Position: Sitting, Cuff Size: Normal)   Pulse 71   Temp 98 F (36.7 C) (Oral)   Ht 5\' 2"  (1.575 m)   Wt (!) 329 lb 11.2 oz (149.6 kg)   SpO2 99%   BMI 60.30 kg/m   Physical Exam Constitutional:      Appearance: She is well-developed.  HENT:     Head: Normocephalic and atraumatic.     Right Ear: Tympanic membrane normal.     Left Ear: Tympanic membrane normal.     Mouth/Throat:     Mouth: Mucous membranes are moist.     Comments: Tonsils absent  Neurological:     Mental Status: She is alert.     ------------------------------------------------------------------------------------------------------------------------------------------------------------------------------------------------------------------- Assessment and Plan  Viral pharyngitis Constellation of symptoms consistent with viral etiology.  Point-of-care rapid strep is negative.  Recommend continued supportive care  over-the-counter analgesics as well as increase fluid and warm salt water gargles.  Contact clinic if not improving.   No orders of the defined types were placed in this encounter.   No follow-ups on file.    This visit occurred during the SARS-CoV-2 public health emergency.  Safety protocols were in place, including screening questions prior to the visit, additional usage of staff PPE, and extensive cleaning of exam room while observing appropriate contact time as indicated for disinfecting solutions.

## 2024-02-04 NOTE — Patient Instructions (Signed)

## 2024-02-04 NOTE — Assessment & Plan Note (Signed)
 Constellation of symptoms consistent with viral etiology.  Point-of-care rapid strep is negative.  Recommend continued supportive care over-the-counter analgesics as well as increase fluid and warm salt water gargles.  Contact clinic if not improving.

## 2024-02-10 ENCOUNTER — Encounter (HOSPITAL_BASED_OUTPATIENT_CLINIC_OR_DEPARTMENT_OTHER): Payer: Self-pay | Admitting: Emergency Medicine

## 2024-02-10 DIAGNOSIS — S299XXA Unspecified injury of thorax, initial encounter: Secondary | ICD-10-CM | POA: Diagnosis not present

## 2024-02-10 DIAGNOSIS — R1012 Left upper quadrant pain: Secondary | ICD-10-CM | POA: Diagnosis not present

## 2024-02-10 DIAGNOSIS — I10 Essential (primary) hypertension: Secondary | ICD-10-CM | POA: Diagnosis not present

## 2024-02-10 DIAGNOSIS — M546 Pain in thoracic spine: Secondary | ICD-10-CM | POA: Diagnosis not present

## 2024-02-10 DIAGNOSIS — S3991XA Unspecified injury of abdomen, initial encounter: Secondary | ICD-10-CM | POA: Diagnosis not present

## 2024-02-10 DIAGNOSIS — M545 Low back pain, unspecified: Secondary | ICD-10-CM | POA: Diagnosis not present

## 2024-02-10 DIAGNOSIS — S3993XA Unspecified injury of pelvis, initial encounter: Secondary | ICD-10-CM | POA: Diagnosis not present

## 2024-02-10 DIAGNOSIS — R112 Nausea with vomiting, unspecified: Secondary | ICD-10-CM | POA: Diagnosis not present

## 2024-02-10 LAB — COMPREHENSIVE METABOLIC PANEL WITH GFR
ALT: 22 U/L (ref 0–44)
AST: 18 U/L (ref 15–41)
Albumin: 3.9 g/dL (ref 3.5–5.0)
Alkaline Phosphatase: 58 U/L (ref 38–126)
Anion gap: 8 (ref 5–15)
BUN: 13 mg/dL (ref 6–20)
CO2: 25 mmol/L (ref 22–32)
Calcium: 9.3 mg/dL (ref 8.9–10.3)
Chloride: 106 mmol/L (ref 98–111)
Creatinine, Ser: 0.77 mg/dL (ref 0.44–1.00)
GFR, Estimated: >60 mL/min (ref >60)
Glucose, Bld: 105 mg/dL — ABNORMAL HIGH (ref 70–99)
Potassium: 4.4 mmol/L (ref 3.5–5.1)
Sodium: 139 mmol/L (ref 135–145)
Total Bilirubin: 0.5 mg/dL (ref 0.0–1.2)
Total Protein: 7.2 g/dL (ref 6.5–8.1)

## 2024-02-10 LAB — CBC WITH DIFFERENTIAL/PLATELET
Abs Immature Granulocytes: 0.03 10*3/uL (ref 0.00–0.07)
Basophils Absolute: 0.1 10*3/uL (ref 0.0–0.1)
Basophils Relative: 1 %
Eosinophils Absolute: 0.2 10*3/uL (ref 0.0–0.5)
Eosinophils Relative: 3 %
HCT: 38.9 % (ref 36.0–46.0)
Hemoglobin: 12.3 g/dL (ref 12.0–15.0)
Immature Granulocytes: 0 %
Lymphocytes Relative: 32 %
Lymphs Abs: 2.9 10*3/uL (ref 0.7–4.0)
MCH: 26.4 pg (ref 26.0–34.0)
MCHC: 31.6 g/dL (ref 30.0–36.0)
MCV: 83.5 fL (ref 80.0–100.0)
Monocytes Absolute: 0.5 10*3/uL (ref 0.1–1.0)
Monocytes Relative: 6 %
Neutro Abs: 5.3 10*3/uL (ref 1.7–7.7)
Neutrophils Relative %: 58 %
Platelets: 344 10*3/uL (ref 150–400)
RBC: 4.66 MIL/uL (ref 3.87–5.11)
RDW: 15.5 % (ref 11.5–15.5)
WBC: 9 10*3/uL (ref 4.0–10.5)
nRBC: 0 % (ref 0.0–0.2)

## 2024-02-10 LAB — LIPASE, BLOOD: Lipase: 46 U/L (ref 11–51)

## 2024-02-10 NOTE — ED Triage Notes (Signed)
 Pt c/o LUQ pain that radiates around to back, started around 4pm; vomited x 3

## 2024-02-11 ENCOUNTER — Emergency Department (HOSPITAL_BASED_OUTPATIENT_CLINIC_OR_DEPARTMENT_OTHER)
Admission: EM | Admit: 2024-02-11 | Discharge: 2024-02-11 | Disposition: A | Discharge status: Home / Self Care | Attending: Emergency Medicine | Admitting: Emergency Medicine

## 2024-02-11 ENCOUNTER — Other Ambulatory Visit (HOSPITAL_COMMUNITY): Payer: Self-pay

## 2024-02-11 ENCOUNTER — Emergency Department (HOSPITAL_BASED_OUTPATIENT_CLINIC_OR_DEPARTMENT_OTHER)

## 2024-02-11 DIAGNOSIS — S299XXA Unspecified injury of thorax, initial encounter: Secondary | ICD-10-CM | POA: Diagnosis not present

## 2024-02-11 DIAGNOSIS — M549 Dorsalgia, unspecified: Secondary | ICD-10-CM

## 2024-02-11 DIAGNOSIS — S3991XA Unspecified injury of abdomen, initial encounter: Secondary | ICD-10-CM | POA: Diagnosis not present

## 2024-02-11 DIAGNOSIS — R1012 Left upper quadrant pain: Secondary | ICD-10-CM

## 2024-02-11 DIAGNOSIS — R112 Nausea with vomiting, unspecified: Secondary | ICD-10-CM | POA: Diagnosis not present

## 2024-02-11 DIAGNOSIS — S3993XA Unspecified injury of pelvis, initial encounter: Secondary | ICD-10-CM | POA: Diagnosis not present

## 2024-02-11 LAB — URINALYSIS, ROUTINE W REFLEX MICROSCOPIC
Bilirubin Urine: NEGATIVE
Glucose, UA: NEGATIVE mg/dL
Hgb urine dipstick: NEGATIVE
Ketones, ur: NEGATIVE mg/dL
Leukocytes,Ua: NEGATIVE
Nitrite: NEGATIVE
Protein, ur: NEGATIVE mg/dL
Specific Gravity, Urine: >=1.03 (ref 1.005–1.030)
pH: 6 (ref 5.0–8.0)

## 2024-02-11 LAB — PREGNANCY, URINE: Preg Test, Ur: NEGATIVE

## 2024-02-11 MED ORDER — KETOROLAC TROMETHAMINE 30 MG/ML IJ SOLN
30.0000 mg | Freq: Once | INTRAMUSCULAR | Status: AC
  Administered 2024-02-11: 30 mg via INTRAVENOUS
  Filled 2024-02-11: qty 1

## 2024-02-11 MED ORDER — IOHEXOL 350 MG/ML SOLN
100.0000 mL | Freq: Once | INTRAVENOUS | Status: AC | PRN
  Administered 2024-02-11: 100 mL via INTRAVENOUS

## 2024-02-11 MED ORDER — LIDOCAINE 5 % EX PTCH
1.0000 | MEDICATED_PATCH | CUTANEOUS | 0 refills | Status: AC
Start: 2024-02-11 — End: ?
  Filled 2024-02-11: qty 30, 30d supply, fill #0

## 2024-02-11 MED ORDER — DICYCLOMINE HCL 20 MG PO TABS
20.0000 mg | ORAL_TABLET | Freq: Two times a day (BID) | ORAL | 0 refills | Status: AC
  Filled 2024-02-11 (×2): qty 20, 10d supply, fill #0

## 2024-02-11 MED ORDER — FENTANYL CITRATE PF 50 MCG/ML IJ SOSY: 25.0000 ug | PREFILLED_SYRINGE | Freq: Once | INTRAMUSCULAR | Status: DC

## 2024-02-11 MED ORDER — NAPROXEN 500 MG PO TABS
500.0000 mg | ORAL_TABLET | Freq: Two times a day (BID) | ORAL | 0 refills | Status: AC
  Filled 2024-02-11: qty 10, 5d supply, fill #0

## 2024-02-11 NOTE — ED Notes (Signed)
 Pt returned from scanner waiting on results. Pain well controlled and no needs at this time.

## 2024-02-11 NOTE — ED Provider Notes (Signed)
 [place] [facility] Provider Note   CSN: [id number] Arrival date & time: 02/10/24  2223     History  Chief Complaint  Patient presents with   Abdominal Pain    [name] Fuentes is a 33 y.o. female.  The history is provided by the patient.  Abdominal Pain Pain location:  LUQ Pain quality: shooting   Pain radiation: radiated to the back at that level, under the ribs. Pain severity:  Severe Onset quality:  Sudden Timing:  Constant Progression:  Unchanged Chronicity:  New Context: not retching and not trauma   Relieved by:  Nothing Worsened by:  Nothing Associated symptoms: vomiting   Associated symptoms: no diarrhea, no fever and no shortness of breath   Risk factors: not pregnant   Patient with a history of GERD and HTN presents with pain in the LUQ under the ribs that radiates to the back at that level.  Pain started at approximately 4 pm and is waxing and waning.  Associated emesis x 3, no diarrhea.      Past Medical History:  Diagnosis Date   Allergy to alpha-gal    Anemia    hx of low iron   Anxiety    Dyspnea    on exertion    GERD (gastroesophageal reflux disease)    History of kidney stones 10/28/2019   passed    Hydradenitis    Hypertension    no longer on medication   Obese    PCOS (polycystic ovarian syndrome)    Psoriasis      Home Medications Prior to Admission medications   Medication Sig Start Date End Date Taking? Authorizing Provider  dicyclomine (BENTYL) 20 MG tablet Take 1 tablet (20 mg total) by mouth 2 (two) times daily. 02/11/24  Yes Labrea Eccleston, MD  lidocaine (LIDODERM) 5 % Place 1 patch onto the skin daily. Remove & Discard patch within 12 hours or as directed by MD 02/11/24  Yes Feliberto Stockley, MD  naproxen (NAPROSYN) 500 MG tablet Take 1 tablet (500 mg total) by mouth 2 (two) times daily with a meal. 02/11/24  Yes Taylon Coole, MD  albuterol (VENTOLIN HFA) 108 (90 Base) MCG/ACT inhaler Inhale  1-2 puffs into the lungs every 6 (six) hours as needed. 10/30/23   Margaretann Loveless, PA-C  clobetasol cream (TEMOVATE) 0.05 % clobetasol 0.05 % topical cream  APPLY 1 APPLICATION ON TO THE SKIN 2 TIMES DAILY AS NEEDED FOR PSORIASIS 04/18/23   Breeback, Jade L, PA-C  escitalopram (LEXAPRO) 20 MG tablet Take 1 tablet (20 mg total) by mouth daily. 04/18/23   Breeback, Lonna Cobb, PA-C  guaiFENesin (MUCINEX) 600 MG 12 hr tablet Take 1 tablet (600 mg total) by mouth 2 (two) times daily. 10/28/23   Dolphus Jenny, PA-C  INOSITOL PO Take by mouth.    [provider]  letrozole [facility]) 2.5 MG tablet Take 2 tablets daily by mouth on cycle days 3-7 12/29/23     medroxyPROGESTERone (PROVERA) 10 MG tablet Take one tablet (10 mg dose) by mouth daily. 05/02/23     pantoprazole (PROTONIX) 40 MG tablet Take 1 tablet (40 mg total) by mouth daily. 01/01/24   Breeback, Lonna Cobb, PA-C  Prenatal Vit-Fe Fumarate-FA (PRENATAL MULTIVITAMIN) TABS tablet Take 1 tablet by mouth daily at 12 noon.    [provider]  progesterone (ENDOMETRIN) 100 MG vaginal insert Place one tablet (100 mg dose) vaginally at bedtime. 01/02/24     buPROPion [facility]  XL) 150 MG 24 hr tablet Take 1 tablet (150 mg total) by mouth every morning. Patient not taking: No sig reported 08/07/20 12/05/20  Tandy Gaw L, PA-C      Allergies    Other, Accutane [isotretinoin], Buspar [buspirone], and Metformin and related    Review of Systems   Review of Systems  Constitutional:  Negative for fever.  Respiratory:  Negative for shortness of breath.   Gastrointestinal:  Positive for abdominal pain and vomiting. Negative for diarrhea.  All other systems reviewed and are negative.   Physical Exam Updated Vital Signs BP 113/65   Pulse 72   Temp 98 F (36.7 C) (Oral)   Resp 18   Ht 5\' 2"  (1.575 m)   Wt (!) 149.6 kg   LMP 02/03/2024   SpO2 97%   BMI 60.32 kg/m  Physical Exam Vitals and nursing note reviewed.  Constitutional:       General: She is not in acute distress.    Appearance: Normal appearance. She is well-developed.  HENT:     Head: Normocephalic and atraumatic.     Nose: Nose normal.  Eyes:     Pupils: Pupils are equal, round, and reactive to light.  Cardiovascular:     Rate and Rhythm: Normal rate and regular rhythm.     Pulses: Normal pulses.     Heart sounds: Normal heart sounds.  Pulmonary:     Effort: Pulmonary effort is normal. No respiratory distress.     Breath sounds: Normal breath sounds.  Abdominal:     General: Bowel sounds are normal. There is no distension.     Palpations: Abdomen is soft.     Tenderness: There is no abdominal tenderness. There is no guarding or rebound.     Hernia: No hernia is present.  Musculoskeletal:        General: Normal range of motion.       Arms:     Cervical back: Normal range of motion and neck supple.  Skin:    General: Skin is warm and dry.     Capillary Refill: Capillary refill takes less than 2 seconds.     Findings: No erythema or rash.  Neurological:     General: No focal deficit present.     Mental Status: She is alert and oriented to person, place, and time.     Deep Tendon Reflexes: Reflexes normal.  Psychiatric:        Mood and Affect: Mood normal.        Behavior: Behavior normal.     ED Results / Procedures / Treatments   Labs (all labs ordered are listed, but only abnormal results are displayed) Results for orders placed or performed during the hospital encounter of 02/11/24  Lipase, blood   Collection Time: 02/10/24 10:47 PM  Result Value Ref Range   Lipase 46 11 - 51 U/L  Comprehensive metabolic panel   Collection Time: 02/10/24 10:47 PM  Result Value Ref Range   Sodium 139 135 - 145 mmol/L   Potassium 4.4 3.5 - 5.1 mmol/L   Chloride 106 98 - 111 mmol/L   CO2 25 22 - 32 mmol/L   Glucose, Bld 105 (H) 70 - 99 mg/dL   BUN 13 6 - 20 mg/dL   Creatinine, Ser 2.95 0.44 - 1.00 mg/dL   Calcium 9.3 8.9 - 62.1 mg/dL   Total  Protein 7.2 6.5 - 8.1 g/dL   Albumin 3.9 3.5 - 5.0 g/dL   AST 18  15 - 41 U/L   ALT 22 0 - 44 U/L   Alkaline Phosphatase 58 38 - 126 U/L   Total Bilirubin 0.5 0.0 - 1.2 mg/dL   GFR, Estimated >16 >10 mL/min   Anion gap 8 5 - 15  Urinalysis, Routine w reflex microscopic -Urine, Clean Catch   Collection Time: 02/10/24 10:47 PM  Result Value Ref Range   Color, Urine YELLOW YELLOW   APPearance CLEAR CLEAR   Specific Gravity, Urine >=1.030 1.005 - 1.030   pH 6.0 5.0 - 8.0   Glucose, UA NEGATIVE NEGATIVE mg/dL   Hgb urine dipstick NEGATIVE NEGATIVE   Bilirubin Urine NEGATIVE NEGATIVE   Ketones, ur NEGATIVE NEGATIVE mg/dL   Protein, ur NEGATIVE NEGATIVE mg/dL   Nitrite NEGATIVE NEGATIVE   Leukocytes,Ua NEGATIVE NEGATIVE  Pregnancy, urine   Collection Time: 02/10/24 10:47 PM  Result Value Ref Range   Preg Test, Ur NEGATIVE NEGATIVE  CBC with Differential   Collection Time: 02/10/24 10:47 PM  Result Value Ref Range   WBC 9.0 4.0 - 10.5 K/uL   RBC 4.66 3.87 - 5.11 MIL/uL   Hemoglobin 12.3 12.0 - 15.0 g/dL   HCT 96.0 45.4 - 09.8 %   MCV 83.5 80.0 - 100.0 fL   MCH 26.4 26.0 - 34.0 pg   MCHC 31.6 30.0 - 36.0 g/dL   RDW 11.9 14.7 - 82.9 %   Platelets 344 150 - 400 K/uL   nRBC 0.0 0.0 - 0.2 %   Neutrophils Relative % 58 %   Neutro Abs 5.3 1.7 - 7.7 K/uL   Lymphocytes Relative 32 %   Lymphs Abs 2.9 0.7 - 4.0 K/uL   Monocytes Relative 6 %   Monocytes Absolute 0.5 0.1 - 1.0 K/uL   Eosinophils Relative 3 %   Eosinophils Absolute 0.2 0.0 - 0.5 K/uL   Basophils Relative 1 %   Basophils Absolute 0.1 0.0 - 0.1 K/uL   Immature Granulocytes 0 %   Abs Immature Granulocytes 0.03 0.00 - 0.07 K/uL   CT Angio Chest PE W and/or Wo Contrast Result Date: 02/11/2024 CLINICAL DATA:  Blunt poly trauma. Left upper quadrant abdominal pain radiating around the back with nausea and vomiting EXAM: CT ANGIOGRAPHY CHEST CT ABDOMEN AND PELVIS WITH CONTRAST TECHNIQUE: Multidetector CT imaging of the chest was  performed using the standard protocol during bolus administration of intravenous contrast. Multiplanar CT image reconstructions and MIPs were obtained to evaluate the vascular anatomy. Multidetector CT imaging of the abdomen and pelvis was performed using the standard protocol during bolus administration of intravenous contrast. RADIATION DOSE REDUCTION: This exam was performed according to the departmental dose-optimization program which includes automated exposure control, adjustment of the mA and/or kV according to patient size and/or use of iterative reconstruction technique. CONTRAST:  OMNIPAQUE IOHEXOL 350 MG/ML SOLN COMPARISON:  Abdominal CT 10/29/2018 FINDINGS: CTA CHEST FINDINGS Cardiovascular: Satisfactory opacification of the pulmonary arteries to the segmental level. No evidence of pulmonary embolism. Normal heart size. No pericardial effusion. Mediastinum/Nodes: Negative for mass or adenopathy Lungs/Pleura: There is no edema, consolidation, effusion, or pneumothorax. Musculoskeletal: No acute or aggressive finding Review of the MIP images confirms the above findings. CT ABDOMEN and PELVIS FINDINGS Hepatobiliary: No focal liver abnormality.Cholecystectomy. No biliary dilatation. Pancreas: Unremarkable. Spleen: Unremarkable. Adrenals/Urinary Tract: Negative adrenals. No hydronephrosis or stone. Unremarkable bladder. Stomach/Bowel: Postoperative stomach. No bowel obstruction or visible inflammation. Vascular/Lymphatic: No acute vascular abnormality. No mass or adenopathy. Reproductive:No pathologic findings. Other: No ascites or pneumoperitoneum. Musculoskeletal: No  acute abnormalities. Review of the MIP images confirms the above findings. IMPRESSION: No acute finding in the chest or abdomen. Negative for pulmonary embolism. Electronically Signed   By: Tiburcio Pea M.D.   On: 02/11/2024 05:53   CT ABDOMEN PELVIS W CONTRAST Result Date: 02/11/2024 CLINICAL DATA:  Blunt poly trauma. Left upper  quadrant abdominal pain radiating around the back with nausea and vomiting EXAM: CT ANGIOGRAPHY CHEST CT ABDOMEN AND PELVIS WITH CONTRAST TECHNIQUE: Multidetector CT imaging of the chest was performed using the standard protocol during bolus administration of intravenous contrast. Multiplanar CT image reconstructions and MIPs were obtained to evaluate the vascular anatomy. Multidetector CT imaging of the abdomen and pelvis was performed using the standard protocol during bolus administration of intravenous contrast. RADIATION DOSE REDUCTION: This exam was performed according to the departmental dose-optimization program which includes automated exposure control, adjustment of the mA and/or kV according to patient size and/or use of iterative reconstruction technique. CONTRAST:  OMNIPAQUE IOHEXOL 350 MG/ML SOLN COMPARISON:  Abdominal CT 10/29/2018 FINDINGS: CTA CHEST FINDINGS Cardiovascular: Satisfactory opacification of the pulmonary arteries to the segmental level. No evidence of pulmonary embolism. Normal heart size. No pericardial effusion. Mediastinum/Nodes: Negative for mass or adenopathy Lungs/Pleura: There is no edema, consolidation, effusion, or pneumothorax. Musculoskeletal: No acute or aggressive finding Review of the MIP images confirms the above findings. CT ABDOMEN and PELVIS FINDINGS Hepatobiliary: No focal liver abnormality.Cholecystectomy. No biliary dilatation. Pancreas: Unremarkable. Spleen: Unremarkable. Adrenals/Urinary Tract: Negative adrenals. No hydronephrosis or stone. Unremarkable bladder. Stomach/Bowel: Postoperative stomach. No bowel obstruction or visible inflammation. Vascular/Lymphatic: No acute vascular abnormality. No mass or adenopathy. Reproductive:No pathologic findings. Other: No ascites or pneumoperitoneum. Musculoskeletal: No acute abnormalities. Review of the MIP images confirms the above findings. IMPRESSION: No acute finding in the chest or abdomen. Negative for  pulmonary embolism. Electronically Signed   By: Tiburcio Pea M.D.   On: 02/11/2024 05:53    Radiology CT Angio Chest PE W and/or Wo Contrast Result Date: 02/11/2024 CLINICAL DATA:  Blunt poly trauma. Left upper quadrant abdominal pain radiating around the back with nausea and vomiting EXAM: CT ANGIOGRAPHY CHEST CT ABDOMEN AND PELVIS WITH CONTRAST TECHNIQUE: Multidetector CT imaging of the chest was performed using the standard protocol during bolus administration of intravenous contrast. Multiplanar CT image reconstructions and MIPs were obtained to evaluate the vascular anatomy. Multidetector CT imaging of the abdomen and pelvis was performed using the standard protocol during bolus administration of intravenous contrast. RADIATION DOSE REDUCTION: This exam was performed according to the departmental dose-optimization program which includes automated exposure control, adjustment of the mA and/or kV according to patient size and/or use of iterative reconstruction technique. CONTRAST:  OMNIPAQUE IOHEXOL 350 MG/ML SOLN COMPARISON:  Abdominal CT 10/29/2018 FINDINGS: CTA CHEST FINDINGS Cardiovascular: Satisfactory opacification of the pulmonary arteries to the segmental level. No evidence of pulmonary embolism. Normal heart size. No pericardial effusion. Mediastinum/Nodes: Negative for mass or adenopathy Lungs/Pleura: There is no edema, consolidation, effusion, or pneumothorax. Musculoskeletal: No acute or aggressive finding Review of the MIP images confirms the above findings. CT ABDOMEN and PELVIS FINDINGS Hepatobiliary: No focal liver abnormality.Cholecystectomy. No biliary dilatation. Pancreas: Unremarkable. Spleen: Unremarkable. Adrenals/Urinary Tract: Negative adrenals. No hydronephrosis or stone. Unremarkable bladder. Stomach/Bowel: Postoperative stomach. No bowel obstruction or visible inflammation. Vascular/Lymphatic: No acute vascular abnormality. No mass or adenopathy. Reproductive:No pathologic  findings. Other: No ascites or pneumoperitoneum. Musculoskeletal: No acute abnormalities. Review of the MIP images confirms the above findings. IMPRESSION: No acute finding in the chest  or abdomen. Negative for pulmonary embolism. Electronically Signed   By: Tiburcio Pea M.D.   On: 02/11/2024 05:53   CT ABDOMEN PELVIS W CONTRAST Result Date: 02/11/2024 CLINICAL DATA:  Blunt poly trauma. Left upper quadrant abdominal pain radiating around the back with nausea and vomiting EXAM: CT ANGIOGRAPHY CHEST CT ABDOMEN AND PELVIS WITH CONTRAST TECHNIQUE: Multidetector CT imaging of the chest was performed using the standard protocol during bolus administration of intravenous contrast. Multiplanar CT image reconstructions and MIPs were obtained to evaluate the vascular anatomy. Multidetector CT imaging of the abdomen and pelvis was performed using the standard protocol during bolus administration of intravenous contrast. RADIATION DOSE REDUCTION: This exam was performed according to the departmental dose-optimization program which includes automated exposure control, adjustment of the mA and/or kV according to patient size and/or use of iterative reconstruction technique. CONTRAST:  OMNIPAQUE IOHEXOL 350 MG/ML SOLN COMPARISON:  Abdominal CT 10/29/2018 FINDINGS: CTA CHEST FINDINGS Cardiovascular: Satisfactory opacification of the pulmonary arteries to the segmental level. No evidence of pulmonary embolism. Normal heart size. No pericardial effusion. Mediastinum/Nodes: Negative for mass or adenopathy Lungs/Pleura: There is no edema, consolidation, effusion, or pneumothorax. Musculoskeletal: No acute or aggressive finding Review of the MIP images confirms the above findings. CT ABDOMEN and PELVIS FINDINGS Hepatobiliary: No focal liver abnormality.Cholecystectomy. No biliary dilatation. Pancreas: Unremarkable. Spleen: Unremarkable. Adrenals/Urinary Tract: Negative adrenals. No hydronephrosis or stone. Unremarkable  bladder. Stomach/Bowel: Postoperative stomach. No bowel obstruction or visible inflammation. Vascular/Lymphatic: No acute vascular abnormality. No mass or adenopathy. Reproductive:No pathologic findings. Other: No ascites or pneumoperitoneum. Musculoskeletal: No acute abnormalities. Review of the MIP images confirms the above findings. IMPRESSION: No acute finding in the chest or abdomen. Negative for pulmonary embolism. Electronically Signed   By: Tiburcio Pea M.D.   On: 02/11/2024 05:53    Procedures Procedures    Medications Ordered in ED Medications  fentaNYL (SUBLIMAZE) injection 25 mcg (0 mcg Intravenous Hold 02/11/24 0241)  ketorolac (TORADOL) 30 MG/ML injection 30 mg (30 mg Intravenous Given 02/11/24 0143)  iohexol (OMNIPAQUE) 350 MG/ML injection 100 mL (100 mLs Intravenous Contrast Given 02/11/24 0149)    ED Course/ Medical Decision Making/ A&P                                 Medical Decision Making Patient with pain under left ribs and through to her back at that same level.    Amount and/or Complexity of Data Reviewed Independent Historian: spouse    Details: See above  External Data Reviewed: notes.    Details: Previous notes reviewed  Labs: ordered.    Details: Urine pregnancy is negative, urine is negative for UTI also negative for hematuria.  Normal white count 9, normal hemoglobin 12.3, normal platelets.  Normal lipase 46, normal sodium 139, normal potassium 4.4 normal creatinine.  Normal liver function panel Radiology: ordered and independent interpretation performed.    Details: No SBO on CT abdomen, No PNA on CTA by me   Risk Prescription drug management. Parenteral controlled substances. Risk Details: Exam, vitals, labs and imaging are reassuring.  Differential: PE, this was excluded on CTA, PNA, this was also excluded on CTA.  Kidney stones, this was excluded on CT, Diverticulitis, this was also ruled out.  This could be a viral gastroenteritis with bowel spasm.  this could also be muscle spasm given patient had tenderness in the area of the spine of the left scapula. I favor MSK  pain, though there is no known trauma. I will treat with antispasmodics and pain medication and lidoderm.  I will have patient follow up with their PMD for ongoing care.  Stable for discharge.  Strict return    Final Clinical Impression(s) / ED Diagnoses Final diagnoses:  LUQ pain   No signs of systemic illness or infection. The patient is nontoxic-appearing on exam and vital signs are within normal limits.  I have reviewed the triage vital signs and the nursing notes. Pertinent labs & imaging results that were available during my care of the patient were reviewed by me and considered in my medical decision making (see chart for details). After history, exam, and medical workup I feel the patient has been appropriately medically screened and is safe for discharge home. Pertinent diagnoses were discussed with the patient. Patient was given return precautions.  Rx / DC Orders ED Discharge Orders          Ordered    dicyclomine (BENTYL) 20 MG tablet  2 times daily        02/11/24 0557    lidocaine (LIDODERM) 5 %  Every 24 hours        02/11/24 0603    naproxen (NAPROSYN) 500 MG tablet  2 times daily with meals        02/11/24 0603              Chole Driver, MD 02/11/24 [id number]

## 2024-02-11 NOTE — ED Notes (Signed)
 Rn notified CT that IV will not flush at speed needed for exam and had been removed. RN went over to start another line for exam.

## 2024-02-23 ENCOUNTER — Other Ambulatory Visit (HOSPITAL_COMMUNITY): Payer: Self-pay

## 2024-03-07 ENCOUNTER — Emergency Department (HOSPITAL_COMMUNITY)

## 2024-03-07 ENCOUNTER — Other Ambulatory Visit: Payer: Self-pay

## 2024-03-07 ENCOUNTER — Emergency Department (HOSPITAL_COMMUNITY)
Admission: EM | Admit: 2024-03-07 | Discharge: 2024-03-08 | Disposition: A | Discharge status: Home / Self Care | Attending: Emergency Medicine | Admitting: Emergency Medicine

## 2024-03-07 ENCOUNTER — Encounter (HOSPITAL_COMMUNITY): Payer: Self-pay

## 2024-03-07 DIAGNOSIS — R1012 Left upper quadrant pain: Secondary | ICD-10-CM | POA: Diagnosis not present

## 2024-03-07 DIAGNOSIS — N939 Abnormal uterine and vaginal bleeding, unspecified: Secondary | ICD-10-CM

## 2024-03-07 DIAGNOSIS — N83201 Unspecified ovarian cyst, right side: Secondary | ICD-10-CM

## 2024-03-07 DIAGNOSIS — N83202 Unspecified ovarian cyst, left side: Secondary | ICD-10-CM | POA: Diagnosis not present

## 2024-03-07 DIAGNOSIS — R109 Unspecified abdominal pain: Secondary | ICD-10-CM

## 2024-03-07 DIAGNOSIS — R10A0 Flank pain, unspecified side: Secondary | ICD-10-CM

## 2024-03-07 LAB — URINALYSIS, ROUTINE W REFLEX MICROSCOPIC
Bacteria, UA: NONE SEEN
Bilirubin Urine: NEGATIVE
Glucose, UA: NEGATIVE mg/dL
Ketones, ur: NEGATIVE mg/dL
Nitrite: NEGATIVE
Protein, ur: 30 mg/dL — AB
RBC / HPF: >50 RBC/hpf (ref 0–5)
Specific Gravity, Urine: 1.027 (ref 1.005–1.030)
pH: 6 (ref 5.0–8.0)

## 2024-03-07 LAB — CBC
HCT: 39.6 % (ref 36.0–46.0)
Hemoglobin: 12.3 g/dL (ref 12.0–15.0)
MCH: 26.6 pg (ref 26.0–34.0)
MCHC: 31.1 g/dL (ref 30.0–36.0)
MCV: 85.7 fL (ref 80.0–100.0)
Platelets: 334 10*3/uL (ref 150–400)
RBC: 4.62 MIL/uL (ref 3.87–5.11)
RDW: 15 % (ref 11.5–15.5)
WBC: 6.8 10*3/uL (ref 4.0–10.5)
nRBC: 0 % (ref 0.0–0.2)

## 2024-03-07 LAB — COMPREHENSIVE METABOLIC PANEL WITH GFR
ALT: 17 U/L (ref 0–44)
AST: 16 U/L (ref 15–41)
Albumin: 3.7 g/dL (ref 3.5–5.0)
Alkaline Phosphatase: 56 U/L (ref 38–126)
Anion gap: 6 (ref 5–15)
BUN: 12 mg/dL (ref 6–20)
CO2: 26 mmol/L (ref 22–32)
Calcium: 9 mg/dL (ref 8.9–10.3)
Chloride: 107 mmol/L (ref 98–111)
Creatinine, Ser: 0.65 mg/dL (ref 0.44–1.00)
GFR, Estimated: >60 mL/min (ref >60)
Glucose, Bld: 100 mg/dL — ABNORMAL HIGH (ref 70–99)
Potassium: 3.9 mmol/L (ref 3.5–5.1)
Sodium: 139 mmol/L (ref 135–145)
Total Bilirubin: 0.4 mg/dL (ref 0.0–1.2)
Total Protein: 7.4 g/dL (ref 6.5–8.1)

## 2024-03-07 LAB — WET PREP, GENITAL
Clue Cells Wet Prep HPF POC: NONE SEEN
Sperm: NONE SEEN
Trich, Wet Prep: NONE SEEN
WBC, Wet Prep HPF POC: <10 (ref <10)
Yeast Wet Prep HPF POC: NONE SEEN

## 2024-03-07 LAB — HCG, QUANTITATIVE, PREGNANCY: hCG, Beta Chain, Quant, S: <1 m[IU]/mL (ref <5)

## 2024-03-07 MED ORDER — SODIUM CHLORIDE 0.9 % IV BOLUS
1000.0000 mL | Freq: Once | INTRAVENOUS | Status: AC
Start: 2024-03-07 — End: 2024-03-08
  Administered 2024-03-07: 1000 mL via INTRAVENOUS

## 2024-03-07 NOTE — ED Provider Notes (Signed)
  EMERGENCY DEPARTMENT AT [facility] Provider Note   CSN: [id number] Arrival date & time: 03/07/24  1934     History  Chief Complaint  Patient presents with   Vaginal Bleeding    [name] Fuentes is a 33 y.o. female history of ectopic pregnancy, multiple miscarriages here presenting with vaginal bleeding.  Patient states that she had multiple miscarriages and she is on Femara  and progesterone  pills to help her get pregnant.  She states that she has been having vaginal bleeding that 10 days earlier than usual.  She noticed some clots but no fetal parts.  She is concerned that she may be miscarrying.  Also she has been having left flank pain for the last month or so.  Patient was seen in the ER about a month ago had an unremarkable CT chest and CT abdomen pelvis.  Patient states that she has a history of kidney stones and would like to get evaluated again.  The history is provided by the patient.       Home Medications Prior to Admission medications   Medication Sig Start Date End Date Taking? Authorizing Provider  albuterol  (VENTOLIN  HFA) 108 (90 Base) MCG/ACT inhaler Inhale 1-2 puffs into the lungs every 6 (six) hours as needed. 10/30/23   Angelia Kelp, PA-C  clobetasol  cream (TEMOVATE ) 0.05 % clobetasol  0.05 % topical cream  APPLY 1 APPLICATION ON TO THE SKIN 2 TIMES DAILY AS NEEDED FOR PSORIASIS 04/18/23   Breeback, Jade L, PA-C  dicyclomine  (BENTYL ) 20 MG tablet Take 1 tablet (20 mg total) by mouth 2 (two) times daily. 02/11/24   Palumbo, April, MD  escitalopram  (LEXAPRO ) 20 MG tablet Take 1 tablet (20 mg total) by mouth daily. 04/18/23   Breeback, Jade L, PA-C  guaiFENesin  (MUCINEX ) 600 MG 12 hr tablet Take 1 tablet (600 mg total) by mouth 2 (two) times daily. 10/28/23   Carie Charity, PA-C  INOSITOL PO Take by mouth.    [provider]  letrozole  (FEMARA ) 2.5 MG tablet Take 2 tablets daily by mouth on cycle days 3-7 12/29/23     lidocaine   (LIDODERM ) 5 % Place 1 patch onto the skin daily. Remove & Discard patch within 12 hours or as directed by MD 02/11/24   Maralee Senate, April, MD  medroxyPROGESTERone  (PROVERA ) 10 MG tablet Take one tablet (10 mg dose) by mouth daily. 05/02/23     naproxen  (NAPROSYN ) 500 MG tablet Take 1 tablet (500 mg total) by mouth 2 (two) times daily with a meal. 02/11/24   Palumbo, April, MD  pantoprazole  (PROTONIX ) 40 MG tablet Take 1 tablet (40 mg total) by mouth daily. 01/01/24   Breeback, Jade L, PA-C  Prenatal Vit-Fe Fumarate-FA (PRENATAL MULTIVITAMIN) TABS tablet Take 1 tablet by mouth daily at 12 noon.    [provider]  progesterone  (ENDOMETRIN ) 100 MG vaginal insert Place one tablet (100 mg dose) vaginally at bedtime. 01/02/24     buPROPion  (WELLBUTRIN  XL) 150 MG 24 hr tablet Take 1 tablet (150 mg total) by mouth every morning. Patient not taking: No sig reported 08/07/20 12/05/20  Breeback, Jade L, PA-C      Allergies    Other, Accutane [isotretinoin], Buspar  [buspirone ], and Metformin  and related    Review of Systems   Review of Systems  Genitourinary:  Positive for flank pain and vaginal bleeding.  All other systems reviewed and are negative.   Physical Exam Updated Vital Signs BP (!) 151/90   Pulse 72  Temp 98 F (36.7 C) (Oral)   Resp 19   LMP 02/03/2024   SpO2 99%  Physical Exam Vitals and nursing note reviewed.  Constitutional:      Comments: Anxious  HENT:     Head: Normocephalic.     Nose: Nose normal.     Mouth/Throat:     Mouth: Mucous membranes are moist.  Eyes:     Extraocular Movements: Extraocular movements intact.     Pupils: Pupils are equal, round, and reactive to light.  Cardiovascular:     Rate and Rhythm: Normal rate and regular rhythm.     Pulses: Normal pulses.     Heart sounds: Normal heart sounds.  Pulmonary:     Effort: Pulmonary effort is normal.     Breath sounds: Normal breath sounds.  Abdominal:     General: Abdomen is flat.     Comments: Mild  left CVA tenderness  Genitourinary:    Comments: Patient has minimal bleeding at the cervical os.  No obvious adnexal tenderness  Musculoskeletal:     Cervical back: Normal range of motion and neck supple.  Skin:    General: Skin is warm.     Capillary Refill: Capillary refill takes less than 2 seconds.  Neurological:     General: No focal deficit present.     Mental Status: She is alert and oriented to person, place, and time.  Psychiatric:        Mood and Affect: Mood normal.        Behavior: Behavior normal.     ED Results / Procedures / Treatments   Labs (all labs ordered are listed, but only abnormal results are displayed) Labs Reviewed  COMPREHENSIVE METABOLIC PANEL WITH GFR - Abnormal; Notable for the following components:      Result Value   Glucose, Bld 100 (*)    All other components within normal limits  URINALYSIS, ROUTINE W REFLEX MICROSCOPIC - Abnormal; Notable for the following components:   APPearance HAZY (*)    Hgb urine dipstick LARGE (*)    Protein, ur 30 (*)    Leukocytes,Ua TRACE (*)    All other components within normal limits  WET PREP, GENITAL  CBC  HCG, QUANTITATIVE, PREGNANCY  GC/CHLAMYDIA PROBE AMP ([place]) NOT AT [facility]    EKG EKG Interpretation Date/Time:  Sunday Mar 07 2024 21:45:16 EDT Ventricular Rate:  71 PR Interval:  148 QRS Duration:  102 QT Interval:  402 QTC Calculation: 437 R Axis:   20  Text Interpretation: Sinus rhythm Low voltage, precordial leads Confirmed by Ballard Bongo [phone]) on 03/07/2024 11:18:41 PM  Radiology No results found.  Procedures Procedures    Medications Ordered in ED Medications  sodium chloride  0.9 % bolus 1,000 mL (1,000 mLs Intravenous New Bag/Given 03/07/24 2119)    ED Course/ Medical Decision Making/ A&P                                 Medical Decision Making [name] Fuentes is a 33 y.o. female here presenting with left flank pain and also vaginal bleeding.  Patient is  concerned that she is pregnant.  Concern for possible ectopic pregnancy versus ovarian cyst versus renal colic.  Will get serum hCG and UA and transvaginal ultrasound and renal ultrasound.  Patient had an unremarkable CT abdomen pelvis a month ago.  11:30 PM Reviewed patient's labs and they were unremarkable.  hCG  is negative.  Renal and pelvic ultrasound pending.  Anticipate discharge if ultrasound is normal    Amount and/or Complexity of Data Reviewed Labs: ordered. Radiology: ordered. ECG/medicine tests: ordered.    Final Clinical Impression(s) / ED Diagnoses Final diagnoses:  None    Rx / DC Orders ED Discharge Orders     None         Dalene Duck, MD 03/07/24 [phone]

## 2024-03-07 NOTE — ED Triage Notes (Signed)
 Pt came in after finding multiple blood clots in her underwear. Pt stated she's not due for her period until 10 more days. Pt is taking Femara  and progesterone  pills as well. Pt also c/o sharp pain under the left side of her rib cage that starts from the front to her back.  Hx of miscarriages

## 2024-03-07 NOTE — Discharge Instructions (Addendum)
 Your workup in the ER was normal.  Your blood count was normal and you are not pregnant  Please follow-up with your OB doctor  Return to ER if you have worse flank pain or abdominal pain or vomiting or fever

## 2024-03-08 LAB — GC/CHLAMYDIA PROBE AMP (~~LOC~~) NOT AT ARMC
Chlamydia: NEGATIVE
Comment: NEGATIVE
Comment: NORMAL
Neisseria Gonorrhea: NEGATIVE

## 2024-03-09 ENCOUNTER — Other Ambulatory Visit (HOSPITAL_COMMUNITY): Payer: Self-pay

## 2024-03-09 MED ORDER — LETROZOLE 2.5 MG PO TABS
5.0000 mg | ORAL_TABLET | Freq: Every day | ORAL | 0 refills | Status: AC
  Filled 2024-03-09 – 2024-03-15 (×2): qty 10, 5d supply, fill #0

## 2024-03-15 ENCOUNTER — Other Ambulatory Visit (HOSPITAL_COMMUNITY): Payer: Self-pay

## 2024-03-15 DIAGNOSIS — M7661 Achilles tendinitis, right leg: Secondary | ICD-10-CM | POA: Diagnosis not present

## 2024-03-15 DIAGNOSIS — M25571 Pain in right ankle and joints of right foot: Secondary | ICD-10-CM | POA: Diagnosis not present

## 2024-03-15 MED ORDER — MELOXICAM 15 MG PO TABS
ORAL_TABLET | ORAL | 1 refills | Status: AC
  Filled 2024-03-15: qty 30, 30d supply, fill #0

## 2024-03-15 MED ORDER — METHYLPREDNISOLONE 4 MG PO TBPK
ORAL_TABLET | ORAL | 0 refills | Status: AC
Start: 2024-03-15 — End: ?
  Filled 2024-03-15: qty 21, 6d supply, fill #0

## 2024-04-12 ENCOUNTER — Other Ambulatory Visit (HOSPITAL_COMMUNITY): Payer: Self-pay

## 2024-05-11 ENCOUNTER — Other Ambulatory Visit: Payer: Self-pay

## 2024-05-11 DIAGNOSIS — R4589 Other symptoms and signs involving emotional state: Secondary | ICD-10-CM

## 2024-05-11 DIAGNOSIS — F419 Anxiety disorder, unspecified: Secondary | ICD-10-CM

## 2024-05-11 NOTE — Telephone Encounter (Signed)
 Requesting rx rf of escitalopram  20mg  tablet Last written 04/18/2023 Last OV 02/04/2024 dr. Darrick sick visit 06/23/2023 Vermell Bologna Upcoming appt = none

## 2024-05-12 MED ORDER — ESCITALOPRAM OXALATE 20 MG PO TABS: 20.0000 mg | ORAL_TABLET | Freq: Every day | ORAL | 0 refills | Status: AC

## 2024-07-22 ENCOUNTER — Telehealth (INDEPENDENT_AMBULATORY_CARE_PROVIDER_SITE_OTHER): Payer: Self-pay | Admitting: Student

## 2024-07-22 ENCOUNTER — Ambulatory Visit: Payer: Self-pay

## 2024-07-22 ENCOUNTER — Encounter: Payer: Self-pay | Admitting: Student

## 2024-07-22 DIAGNOSIS — J208 Acute bronchitis due to other specified organisms: Secondary | ICD-10-CM

## 2024-07-22 MED ORDER — ALBUTEROL SULFATE HFA 108 (90 BASE) MCG/ACT IN AERS: 1.0000 | INHALATION_SPRAY | Freq: Four times a day (QID) | RESPIRATORY_TRACT | 0 refills | Status: AC | PRN

## 2024-07-22 MED ORDER — FLUTICASONE PROPIONATE 50 MCG/ACT NA SUSP: 2.0000 | Freq: Every day | NASAL | 6 refills | Status: AC

## 2024-07-22 NOTE — Telephone Encounter (Signed)
 FYI Only or Action Required?: FYI only for provider.  Patient was last seen in primary care on 02/04/2024 by Alvia Bring, DO.  Called Nurse Triage reporting Covid Exposure.  Symptoms began several days ago.  Interventions attempted: OTC medications: muccinex, ibuprofen , dayquil, nyquil and Prescription medications: her daughter's albuterol  inhaler.  Symptoms are: nausea, sinus pressure, chills, fever diarrhea, cough, post nasal drip,  unchanged.  Triage Disposition: See Physician Within 24 Hours (overriding Call PCP Within 24 Hours)  Patient/caregiver understands and will follow disposition?: Yes               Copied from CRM #[id number]. Topic: Clinical - Red Word Triage >> Jul 22, 2024  8:39 AM Miquel SAILOR wrote: Red Word that prompted transfer to Nurse Triage: sick since monday symptoms: Fever bodyaches/ chills/ sinus/ stomach/Headaches since 09/15 Covid is going on in her work Reason for Disposition  Fever present > 3 days (72 hours)  Answer Assessment - Initial Assessment Questions 1. SYMPTOMS: What is your main symptom or concern? (e.g., cough, fever, shortness of breath, muscle aches)     Fatigue and body aches. Patient is able to stand and walk but states she feels worn out.  2. ONSET: When did the symptoms start?      9/15, Monday.  3. COUGH: Do you have a cough? If Yes, ask: How bad is the cough?       Yes. She states she had coughing and sneezing on Monday but that has mostly resolved.  4. FEVER: Do you have a fever? If Yes, ask: What is your temperature, how was it measured, and when did it start?     Yes, 101.4 on Monday. Lower temps around 100 or less since then.  5. BREATHING DIFFICULTY: Are you having any difficulty breathing? (e.g., normal; shortness of breath, wheezing, unable to speak)      No. Normal breathing. She states on Monday she was using her daughter's albuterol  inhaler and states her breathing has been fine since the cough cleared  up.  6. BETTER-SAME-WORSE: Are you getting better, staying the same or getting worse compared to yesterday?  If getting worse, ask, In what way?     The same.  7. OTHER SYMPTOMS: Do you have any other symptoms?  (e.g., chills, fatigue, headache, loss of smell or taste, muscle pain, sore throat)     Sinus pressure, chills, post nasal drip, nausea, diarrhea (10 episodes of diarrhea in a day). She states she is able to stay hydrated, drinking Gatorade and fluids.  8. COVID-19 DIAGNOSIS: How do you know that you have COVID? (e.g., positive lab test or self-test, diagnosed by doctor or NP/PA, symptoms after exposure).     Symptoms after exposure.  9. COVID-19 EXPOSURE: Was there any known exposure to COVID before the symptoms began?      Coworkers testing positive for COVID.  10. COVID-19 VACCINE: Have you had the COVID-19 vaccine? If Yes, ask: When did you last get it?       Yes, 2021.  11. HIGH RISK DISEASE: Do you have any chronic medical problems? (e.g., asthma, heart or lung disease, weak immune system, obesity, etc.)       Obesity.  12. PREGNANCY: Is there any chance you are pregnant? When was your last menstrual period?       LMP: 06/28/24  13. O2 SATURATION MONITOR:  Do you use an oxygen saturation monitor (pulse oximeter) at home? If Yes, ask What is your reading (oxygen level) today? What  is your usual oxygen saturation reading? (e.g., 95%)       No.  Patient states she does not have any home COVID tests and did not want to go to the store and expose anyone. She states she has called out every day this week from work and was told she has to be seen by a provider.  Protocols used: COVID-19 - Diagnosed or Suspected-A-AH

## 2024-07-22 NOTE — Progress Notes (Signed)
 MyChart Video Visit    Virtual Visit via Video Note    Patient location: Home. Patient and provider in visit Provider location: Office  I discussed the limitations of evaluation and management by telemedicine and the availability of in person appointments. The patient expressed understanding and agreed to proceed.  Visit Date: 07/22/2024  Today's healthcare provider: Harlene LITTIE Jolly, NP     Subjective:    Patient ID: [name] Fuentes, female    DOB: 12-29-1990, 33 y.o.   MRN: [id number]  Chief Complaint  Patient presents with   Covid Exposure    Patient reports being exposed to covid at work last week.    Covid symptoms    Patient started having symptoms 4 days ago, body aches, fever of 101.4, cough and sore throat.     HPI  Patient reports onset of symptoms on Monday, including sore throat, sneezing, cough, body aches, chills, fatigue, muscle pain, and fever up to 101F. She called out of work that day and has continued to experience symptoms since. She notes multiple coworkers and children at her workplace have recently been diagnosed with COVID-19. Additional symptoms include significant diarrhea, sinus pain and pressure, nasal congestion, and persistent cough.   Patient denies fever, chills, SOB, CP, palpitations, dyspnea, edema, HA, vision changes, N/V/D, abdominal pain, urinary symptoms, rash, weight changes, and recent illness or hospitalizations.   Past Medical History:  Diagnosis Date   Allergy to alpha-gal    Anemia    hx of low iron   Anxiety    Dyspnea    on exertion    GERD (gastroesophageal reflux disease)    History of kidney stones 10/28/2019   passed    Hydradenitis    Hypertension    no longer on medication   Obese    PCOS (polycystic ovarian syndrome)    Psoriasis     Past Surgical History:  Procedure Laterality Date   ANKLE RECONSTRUCTION Left    L ankle; plates and screws in place    CHOLECYSTECTOMY N/A 12/01/2018   Procedure:  LAPAROSCOPIC CHOLECYSTECTOMY WITH INTRAOPERATIVE CHOLANGIOGRAM;  Surgeon: Mikell Katz, MD;  Location: [facility] OR;  Service: General;  Laterality: N/A;   LAPAROSCOPIC GASTRIC SLEEVE RESECTION N/A 09/28/2018   Procedure: LAPAROSCOPIC GASTRIC SLEEVE RESECTION, UPPER ENDO, ERAS PATHWAY;  Surgeon: Mikell Katz, MD;  Location: [facility];  Service: General;  Laterality: N/A;   OPEN REDUCTION INTERNAL FIXATION (ORIF) FOOT LISFRANC FRACTURE Right 12/05/2020   Procedure: OPEN TREATMENT OF RIGHT LISFRANC, SECOND TARSOMETATARSAL JOINT AND SECOND METATARSAL BASE FRACTURE;  Surgeon: Elsa Lonni SAUNDERS, MD;  Location: [facility] OR;  Service: Orthopedics;  Laterality: Right;  LENGTH OF SURGERY: 2 HOURS   Tempano Plasty     TONSILLECTOMY     and adenoidectomy   TYMPANOSTOMY TUBE PLACEMENT     several sets   WISDOM TOOTH EXTRACTION      Family History  Problem Relation Age of Onset   Hypertension Mother    Hypertension Father    Hypertension Brother    Hypertension Maternal Grandfather    Cancer Paternal Grandfather        liver   Stroke Paternal Grandfather    Allergic rhinitis Neg Hx    Angioedema Neg Hx    Asthma Neg Hx    Eczema Neg Hx    Immunodeficiency Neg Hx    Urticaria Neg Hx     Social History   Socioeconomic History   Marital status: Legally Separated    Spouse  name: Not on file   Number of children: Not on file   Years of education: Not on file   Highest education level: Not on file  Occupational History   Not on file  Tobacco Use   Smoking status: Never   Smokeless tobacco: Never  Vaping Use   Vaping status: Some Days  Substance and Sexual Activity   Alcohol use: Yes    Comment: occ   Drug use: No   Sexual activity: Not on file  Other Topics Concern   Not on file  Social History Narrative   Not on file   Social Drivers of Health   Financial Resource Strain: Low Risk  (11/19/2023)   Received from [facility]   Overall Financial Resource Strain (CARDIA)     Difficulty of Paying Living Expenses: Not hard at all  Food Insecurity: No Food Insecurity (11/19/2023)   Received from [facility]   Hunger Vital Sign    Within the past 12 months, you worried that your food would run out before you got the money to buy more.: Never true    Within the past 12 months, the food you bought just didn't last and you didn't have money to get more.: Never true  Transportation Needs: No Transportation Needs (11/19/2023)   Received from [facility] - Transportation    Lack of Transportation (Medical): No    Lack of Transportation (Non-Medical): No  Physical Activity: Not on file  Stress: Stress Concern Present (08/11/2023)   Received from [facility] - Occupational Stress Questionnaire    Feeling of Stress : To some extent  Social Connections: Unknown (03/04/2022)   Received from [facility]   Social Network    Social Network: Not on file  Intimate Partner Violence: Not At Risk (08/21/2023)   Received from [facility]   HITS    Over the last 12 months how often did your partner physically hurt you?: Never    Over the last 12 months how often did your partner insult you or talk down to you?: Never    Over the last 12 months how often did your partner threaten you with physical harm?: Never    Over the last 12 months how often did your partner scream or curse at you?: Never  Recent Concern: Intimate Partner Violence - At Risk (08/11/2023)   Received from [facility]   Humiliation, Afraid, Rape, and Kick questionnaire    Within the last year, have you been afraid of your partner or ex-partner?: Yes    Within the last year, have you been humiliated or emotionally abused in other ways by your partner or ex-partner?: No    Within the last year, have you been kicked, hit, slapped, or otherwise physically hurt by your partner or ex-partner?: No    Within the last year, have you been raped or forced to have  any kind of sexual activity by your partner or ex-partner?: No    Outpatient Medications Prior to Visit  Medication Sig Dispense Refill   clobetasol  cream (TEMOVATE ) 0.05 % clobetasol  0.05 % topical cream  APPLY 1 APPLICATION ON TO THE SKIN 2 TIMES DAILY AS NEEDED FOR PSORIASIS 60 g 1   dicyclomine  (BENTYL ) 20 MG tablet Take 1 tablet (20 mg total) by mouth 2 (two) times daily. 20 tablet 0   escitalopram  (LEXAPRO ) 20 MG tablet Take 1 tablet (20 mg total) by mouth daily. 90 tablet 0  guaiFENesin  (MUCINEX ) 600 MG 12 hr tablet Take 1 tablet (600 mg total) by mouth 2 (two) times daily. 60 tablet 0   INOSITOL PO Take by mouth.     letrozole  (FEMARA ) 2.5 MG tablet Take 2 tablets daily by mouth on cycle days 3-7 10 tablet 0   lidocaine  (LIDODERM ) 5 % Place 1 patch onto the skin daily. Remove & Discard patch within 12 hours or as directed by MD 30 patch 0   medroxyPROGESTERone  (PROVERA ) 10 MG tablet Take one tablet (10 mg dose) by mouth daily. 10 tablet 0   meloxicam  (MOBIC ) 15 MG tablet Take 1 tablet by mouth once a day with food after completing prednisone  dose pack 30 tablet 1   methylPREDNISolone  (MEDROL ) 4 MG TBPK tablet take as directed on packaging as instructed by pharmacist. 21 tablet 0   naproxen  (NAPROSYN ) 500 MG tablet Take 1 tablet (500 mg total) by mouth 2 (two) times daily with a meal. 10 tablet 0   pantoprazole  (PROTONIX ) 40 MG tablet Take 1 tablet (40 mg total) by mouth daily. 90 tablet 3   Prenatal Vit-Fe Fumarate-FA (PRENATAL MULTIVITAMIN) TABS tablet Take 1 tablet by mouth daily at 12 noon.     progesterone  (ENDOMETRIN ) 100 MG vaginal insert Place one tablet (100 mg dose) vaginally at bedtime. 30 tablet 3   albuterol  (VENTOLIN  HFA) 108 (90 Base) MCG/ACT inhaler Inhale 1-2 puffs into the lungs every 6 (six) hours as needed. 8 g 0   No facility-administered medications prior to visit.    Allergies  Allergen Reactions   Other Anaphylaxis, Hives, Diarrhea and Nausea And Vomiting     Reaction to red meat   Accutane [Isotretinoin] Other (See Comments)    ? bruising   Buspar  [Buspirone ]     Made her feel funny   Metformin  And Related Diarrhea and Nausea Only    ROS    See HPI Objective:    Physical Exam  General: Patient appears well-nourished and in no acute distress. Alert and oriented to person and place. Engaged with provider throughout visit  HEENT: Head atraumatic and normocephalic. No facial asymmetry.. Pupils appear equal and reactive.  Respiratory: Breathing unlabored. No cough, wheezing, or respiratory distress observed. Respiratory rate appears normal. Neurological: Patient alert and oriented. Speech coherent. No facial droop, slurred speech, or focal deficits observed. Psychiatric: Mood and affect assessed via video. Patient cooperative. Thought process logical/coherent    There were no vitals taken for this visit. Wt Readings from Last 3 Encounters:  02/10/24 (!) 329 lb 12.9 oz (149.6 kg)  02/04/24 (!) 329 lb 11.2 oz (149.6 kg)  10/28/23 300 lb (136.1 kg)       Assessment & Plan:   Problem List Items Addressed This Visit   None Visit Diagnoses       Viral bronchitis       Relevant Medications   albuterol  (VENTOLIN  HFA) 108 (90 Base) MCG/ACT inhaler   fluticasone  (FLONASE ) 50 MCG/ACT nasal spray      Likely Covid 19. Patient educated that antibiotics are not indicated as this is likely viral and self-limiting, with expected resolution in 7-10 days. Instructed to return if symptoms worsen, persist beyond 10 days, or if experiencing red flag symptoms such as high fever >102F, ear pain, facial swelling, shortness of breath, or chest pain.Ensure adequate hydration. Supportive care recommended: maintain adequate hydration, rest, and consider use of humidifier. Symptomatic treatment with OTC medications as appropriate:.  Saline nasal spray, decongestants, throat lozenges for nasal congestion/sore throat.  Advised close monitoring for red flag  symptoms: persistent fever >101F beyond 3 days, SOB, or worsening symptoms beyond 10 days- RTC.  Provided return precautions and RTC if symptoms worsen or fail to improve.      Over the counter medications that may be helpful for symptoms:  Guaifenesin  1200 mg extended release tabs twice daily, with plenty of water  For cough and congestion Brand name: Mucinex    Pseudoephedrine 30 mg, one or two tabs every 4 to 6 hours For sinus congestion Brand name: Sudafed You must get this from the pharmacy counter.  Oxymetazoline nasal spray each morning, one spray in each nostril, for NO MORE THAN 3 days  For nasal and sinus congestion Brand name: Afrin Saline nasal spray or Saline Nasal Irrigation 3-5 times a day For nasal and sinus congestion Brand names: Ocean or AYR Fluticasone  nasal spray, one spray in each nostril, each morning (after oxymetazoline and saline, if used) For nasal and sinus congestion Brand name: Flonase  Warm salt water  gargles  For sore throat Every few hours as needed Alternate ibuprofen  400-600 mg and acetaminophen  1000 mg every 6 hours For fever, body aches, headache Brand names: Motrin  or Advil  and Tylenol  Dextromethorphan 12-hour cough version 30 mg every 12 hours  For cough Brand name: Delsym Stop all other cold medications for now (Nyquil, Dayquil, Tylenol  Cold, Theraflu, etc) and other non-prescription cough/cold preparations. Many of these have the same ingredients listed above and could cause an overdose of medication.   Herbal treatments that have been shown to be helpful in some patients include: Vitamin C 1000 mg per day Vitamin D  4000 iU per day Zinc  100 mg per day Quercetin 25-500 mg twice a day Melatonin 5-10mg  at bedtime Honey Green Tea  General Instructions Allow your body to rest Drink PLENTY of fluids Isolate yourself from everyone, even family, until test results have returned  Once you have recovered, please continue good preventive  care measures, including:  wearing a mask when in public wash your hands frequently avoid touching your face/nose/eyes cover coughs/sneezes with the inside of your elbow   If you develop severe shortness of breath, uncontrolled fevers, coughing up blood, confusion, chest pain, or signs of dehydration (such as significantly decreased urine amounts or dizziness with standing) please go to the ER.   I am having Deshawn I. Lawrance start on fluticasone . I am also having her maintain her clobetasol  cream, medroxyPROGESTERone , guaiFENesin , pantoprazole , Endometrin , prenatal multivitamin, INOSITOL PO, dicyclomine , lidocaine , naproxen , letrozole , methylPREDNISolone , meloxicam , escitalopram , and albuterol .  Meds ordered this encounter  Medications   albuterol  (VENTOLIN  HFA) 108 (90 Base) MCG/ACT inhaler    Sig: Inhale 1-2 puffs into the lungs every 6 (six) hours as needed.    Dispense:  8 g    Refill:  0    Supervising Provider:   DOMENICA BLACKBIRD A [[id number]]   fluticasone  (FLONASE ) 50 MCG/ACT nasal spray    Sig: Place 2 sprays into both nostrils daily.    Dispense:  16 g    Refill:  6    Supervising Provider:   DOMENICA BLACKBIRD A [[id number]]    I discussed the assessment and treatment plan with the patient. The patient was provided an opportunity to ask questions and all were answered. The patient agreed with the plan and demonstrated an understanding of the instructions.   The patient was advised to call back or seek an in-person evaluation if the symptoms worsen or if the condition fails to improve as anticipated.  I provided  11  minutes of face-to-face time during this encounter.   Harlene LITTIE Jolly, NP [facility] [phone] (phone) [phone] (fax)  [facility]
